# Patient Record
Sex: Female | Born: 1935 | Race: Black or African American | Hispanic: No | Marital: Married | Smoking: Former smoker
Health system: Southern US, Community
[De-identification: ages and names within clinical notes are randomized; demographics above are authoritative.]

## PROBLEM LIST (undated history)

## (undated) DIAGNOSIS — E7800 Pure hypercholesterolemia, unspecified: Secondary | ICD-10-CM

## (undated) DIAGNOSIS — K219 Gastro-esophageal reflux disease without esophagitis: Secondary | ICD-10-CM

## (undated) DIAGNOSIS — Z170 Estrogen receptor positive status [ER+]: Secondary | ICD-10-CM

## (undated) DIAGNOSIS — I499 Cardiac arrhythmia, unspecified: Secondary | ICD-10-CM

## (undated) DIAGNOSIS — C50212 Malignant neoplasm of upper-inner quadrant of left female breast: Secondary | ICD-10-CM

## (undated) DIAGNOSIS — C50919 Malignant neoplasm of unspecified site of unspecified female breast: Secondary | ICD-10-CM

## (undated) DIAGNOSIS — Z923 Personal history of irradiation: Secondary | ICD-10-CM

## (undated) DIAGNOSIS — I10 Essential (primary) hypertension: Secondary | ICD-10-CM

## (undated) DIAGNOSIS — R519 Headache, unspecified: Secondary | ICD-10-CM

## (undated) DIAGNOSIS — H269 Unspecified cataract: Secondary | ICD-10-CM

## (undated) DIAGNOSIS — R51 Headache: Secondary | ICD-10-CM

## (undated) DIAGNOSIS — C50912 Malignant neoplasm of unspecified site of left female breast: Secondary | ICD-10-CM

## (undated) HISTORY — DX: Malignant neoplasm of unspecified site of left female breast: C50.912

## (undated) HISTORY — DX: Unspecified cataract: H26.9

## (undated) HISTORY — PX: OOPHORECTOMY: SHX86

## (undated) HISTORY — DX: Estrogen receptor positive status (ER+): Z17.0

## (undated) HISTORY — DX: Malignant neoplasm of upper-inner quadrant of left female breast: C50.212

## (undated) HISTORY — PX: ABDOMINAL HYSTERECTOMY: SHX81

---

## 2004-08-23 ENCOUNTER — Ambulatory Visit: Payer: Self-pay | Admitting: Internal Medicine

## 2005-03-01 ENCOUNTER — Ambulatory Visit: Payer: Self-pay | Admitting: Neurology

## 2005-12-19 ENCOUNTER — Ambulatory Visit: Payer: Self-pay | Admitting: Internal Medicine

## 2006-01-06 ENCOUNTER — Ambulatory Visit: Payer: Self-pay | Admitting: Internal Medicine

## 2006-07-11 ENCOUNTER — Ambulatory Visit: Payer: Self-pay | Admitting: Internal Medicine

## 2007-01-08 ENCOUNTER — Ambulatory Visit: Payer: Self-pay | Admitting: Nurse Practitioner

## 2007-03-05 ENCOUNTER — Ambulatory Visit: Payer: Self-pay | Admitting: Nurse Practitioner

## 2008-04-17 ENCOUNTER — Ambulatory Visit: Payer: Self-pay | Admitting: Internal Medicine

## 2008-05-05 ENCOUNTER — Ambulatory Visit: Payer: Self-pay | Admitting: Internal Medicine

## 2009-02-07 ENCOUNTER — Ambulatory Visit: Payer: Self-pay | Admitting: Otolaryngology

## 2009-03-18 ENCOUNTER — Ambulatory Visit: Payer: Self-pay | Admitting: Internal Medicine

## 2009-05-11 ENCOUNTER — Ambulatory Visit: Payer: Self-pay | Admitting: Family Medicine

## 2010-05-14 ENCOUNTER — Ambulatory Visit: Payer: Self-pay | Admitting: Family Medicine

## 2011-06-18 ENCOUNTER — Ambulatory Visit: Payer: Self-pay | Admitting: Family Medicine

## 2012-06-24 ENCOUNTER — Ambulatory Visit: Payer: Self-pay | Admitting: Family Medicine

## 2013-02-03 ENCOUNTER — Ambulatory Visit: Payer: Self-pay | Admitting: Ophthalmology

## 2013-06-25 ENCOUNTER — Ambulatory Visit: Payer: Self-pay | Admitting: Family Medicine

## 2013-11-04 HISTORY — PX: EYE SURGERY: SHX253

## 2014-03-02 DIAGNOSIS — E7800 Pure hypercholesterolemia, unspecified: Secondary | ICD-10-CM | POA: Insufficient documentation

## 2014-03-02 DIAGNOSIS — I10 Essential (primary) hypertension: Secondary | ICD-10-CM | POA: Insufficient documentation

## 2014-03-02 DIAGNOSIS — K219 Gastro-esophageal reflux disease without esophagitis: Secondary | ICD-10-CM | POA: Insufficient documentation

## 2014-03-02 DIAGNOSIS — N189 Chronic kidney disease, unspecified: Secondary | ICD-10-CM | POA: Insufficient documentation

## 2014-03-02 DIAGNOSIS — I2510 Atherosclerotic heart disease of native coronary artery without angina pectoris: Secondary | ICD-10-CM | POA: Insufficient documentation

## 2014-03-02 DIAGNOSIS — E119 Type 2 diabetes mellitus without complications: Secondary | ICD-10-CM | POA: Insufficient documentation

## 2014-07-12 ENCOUNTER — Ambulatory Visit: Payer: Self-pay | Admitting: Family Medicine

## 2014-08-29 ENCOUNTER — Ambulatory Visit: Payer: Self-pay | Admitting: Physician Assistant

## 2014-09-06 ENCOUNTER — Ambulatory Visit: Payer: Self-pay | Admitting: Family Medicine

## 2015-07-31 ENCOUNTER — Other Ambulatory Visit: Payer: Self-pay | Admitting: Family Medicine

## 2015-07-31 DIAGNOSIS — Z1231 Encounter for screening mammogram for malignant neoplasm of breast: Secondary | ICD-10-CM

## 2015-08-03 ENCOUNTER — Ambulatory Visit
Admission: RE | Admit: 2015-08-03 | Discharge: 2015-08-03 | Disposition: A | Discharge status: Home / Self Care | Payer: Medicare PPO | Source: Ambulatory Visit | Attending: Family Medicine | Admitting: Family Medicine

## 2015-08-03 DIAGNOSIS — Z1231 Encounter for screening mammogram for malignant neoplasm of breast: Secondary | ICD-10-CM | POA: Diagnosis not present

## 2015-11-05 DIAGNOSIS — C50919 Malignant neoplasm of unspecified site of unspecified female breast: Secondary | ICD-10-CM

## 2015-11-05 HISTORY — DX: Malignant neoplasm of unspecified site of unspecified female breast: C50.919

## 2016-06-28 ENCOUNTER — Other Ambulatory Visit: Payer: Self-pay | Admitting: Family Medicine

## 2016-06-28 DIAGNOSIS — Z1231 Encounter for screening mammogram for malignant neoplasm of breast: Secondary | ICD-10-CM

## 2016-08-04 DIAGNOSIS — C50912 Malignant neoplasm of unspecified site of left female breast: Secondary | ICD-10-CM

## 2016-08-04 HISTORY — DX: Malignant neoplasm of unspecified site of left female breast: C50.912

## 2016-08-06 ENCOUNTER — Other Ambulatory Visit: Payer: Self-pay | Admitting: Family Medicine

## 2016-08-06 ENCOUNTER — Ambulatory Visit
Admission: RE | Admit: 2016-08-06 | Discharge: 2016-08-06 | Disposition: A | Discharge status: Home / Self Care | Payer: Medicare Other | Source: Ambulatory Visit | Attending: Family Medicine | Admitting: Family Medicine

## 2016-08-06 DIAGNOSIS — Z1231 Encounter for screening mammogram for malignant neoplasm of breast: Secondary | ICD-10-CM | POA: Diagnosis present

## 2016-08-06 DIAGNOSIS — R928 Other abnormal and inconclusive findings on diagnostic imaging of breast: Secondary | ICD-10-CM | POA: Diagnosis not present

## 2016-08-08 ENCOUNTER — Other Ambulatory Visit: Payer: Self-pay | Admitting: Family Medicine

## 2016-08-08 DIAGNOSIS — R928 Other abnormal and inconclusive findings on diagnostic imaging of breast: Secondary | ICD-10-CM

## 2016-08-08 DIAGNOSIS — N6320 Unspecified lump in the left breast, unspecified quadrant: Secondary | ICD-10-CM

## 2016-08-16 ENCOUNTER — Ambulatory Visit
Admission: RE | Admit: 2016-08-16 | Discharge: 2016-08-16 | Disposition: A | Discharge status: Home / Self Care | Payer: Medicare Other | Source: Ambulatory Visit | Attending: Family Medicine | Admitting: Family Medicine

## 2016-08-16 DIAGNOSIS — R928 Other abnormal and inconclusive findings on diagnostic imaging of breast: Secondary | ICD-10-CM

## 2016-08-16 DIAGNOSIS — N6320 Unspecified lump in the left breast, unspecified quadrant: Secondary | ICD-10-CM

## 2016-08-16 DIAGNOSIS — N6322 Unspecified lump in the left breast, upper inner quadrant: Secondary | ICD-10-CM | POA: Diagnosis not present

## 2016-08-22 ENCOUNTER — Other Ambulatory Visit: Payer: Self-pay | Admitting: Family Medicine

## 2016-08-22 DIAGNOSIS — N6320 Unspecified lump in the left breast, unspecified quadrant: Secondary | ICD-10-CM

## 2016-08-28 ENCOUNTER — Ambulatory Visit
Admission: RE | Admit: 2016-08-28 | Discharge: 2016-08-28 | Disposition: A | Discharge status: Home / Self Care | Payer: Medicare Other | Source: Ambulatory Visit | Attending: Family Medicine | Admitting: Family Medicine

## 2016-08-28 DIAGNOSIS — N6320 Unspecified lump in the left breast, unspecified quadrant: Secondary | ICD-10-CM | POA: Diagnosis present

## 2016-08-28 DIAGNOSIS — C50912 Malignant neoplasm of unspecified site of left female breast: Secondary | ICD-10-CM | POA: Insufficient documentation

## 2016-08-28 HISTORY — PX: BREAST BIOPSY: SHX20

## 2016-09-02 NOTE — Progress Notes (Signed)
  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 Scheduled patient to see Dr. Adonis Huguenin at [facility] 10 31/17 at 2:45, and Dr. Mike Gip 09/11/16 at 2:30. Appointments scheduled in [place] since patient lives in [place] per family request.  [employer] Breast Cancer Treatment Handbook/folder with hospital services.

## 2016-09-03 ENCOUNTER — Ambulatory Visit (INDEPENDENT_AMBULATORY_CARE_PROVIDER_SITE_OTHER): Payer: Medicare Other | Admitting: General Surgery

## 2016-09-03 ENCOUNTER — Encounter: Payer: Self-pay | Admitting: General Surgery

## 2016-09-03 ENCOUNTER — Other Ambulatory Visit: Payer: Self-pay

## 2016-09-03 VITALS — BP 147/73 | Temp 98.0°F | Ht 65.0 in | Wt 166.5 lb

## 2016-09-03 DIAGNOSIS — C50312 Malignant neoplasm of lower-inner quadrant of left female breast: Secondary | ICD-10-CM | POA: Insufficient documentation

## 2016-09-03 DIAGNOSIS — C50919 Malignant neoplasm of unspecified site of unspecified female breast: Secondary | ICD-10-CM

## 2016-09-03 DIAGNOSIS — Z170 Estrogen receptor positive status [ER+]: Secondary | ICD-10-CM

## 2016-09-03 NOTE — Progress Notes (Signed)
Patient ID: Erica Jackson, female   DOB: 07-14-1934, 82 y.o.   MRN: [id number]  CC: BREAST CANCER  HPI Erica Jackson is a 82 y.o. female presents to clinic today for evaluation of a new diagnosis of left breast cancer. Patient reports that she received her annual mammogram earlier this year which found a new finding her left breast that is our to been biopsied. She has been doing self exams and does not feel anything new or different. She is otherwise in her usual state of health. She reports never using birth control, never having hormone replacement therapy, no prior breast biopsies or any personal history of breast tumors. She does have a significant family history of cancers but none of them were breast. There were in her Brothers. She is otherwise in her usual state of excellent health and desires at this cancer versus possible.  HPI  History reviewed. No pertinent past medical history.  Past Surgical History:  Procedure Laterality Date  . ABDOMINAL HYSTERECTOMY    . BREAST BIOPSY Left 08/28/2016   path pending  . EYE SURGERY  2015    Family History  Problem Relation Age of Onset  . Stroke Mother   . Cancer Sister   . Cancer Brother   . Cancer Daughter   . Cancer Brother   . Cancer Brother   . Cancer Brother   . Cancer Brother   . Cancer Brother   . Breast cancer Neg Hx     Social History Social History  Substance Use Topics  . Smoking status: Former Smoker    Packs/day: 1.00  . Smokeless tobacco: Former Systems developer  . Alcohol use No    Not on File  Current Outpatient Prescriptions  Medication Sig Dispense Refill  . aspirin EC 81 MG tablet Take by mouth.    Marland Kitchen atenolol (TENORMIN) 25 MG tablet TAKE ONE TABLET BY MOUTH ONCE DAILY    . felodipine (PLENDIL) 5 MG 24 hr tablet Take by mouth.    . fluticasone (FLONASE) 50 MCG/ACT nasal spray Place into the nose.    . isosorbide dinitrate (ISORDIL) 20 MG tablet Take by mouth.    . lovastatin (MEVACOR) 10 MG tablet TAKE ONE  TABLET BY MOUTH ONCE DAILY WITH DINNER    . omeprazole (PRILOSEC) 20 MG capsule Take by mouth.    . potassium chloride SA (K-DUR,KLOR-CON) 20 MEQ tablet Take by mouth.     No current facility-administered medications for this visit.      Review of Systems A Multi-point review of systems was asked and was negative except for the findings documented in the history of present illness  Physical Exam Blood pressure (!) 147/73, temperature 98 F (36.7 C), temperature source Oral, height 5\' 5"  (1.651 m), weight 75.5 kg (166 lb 8 oz). CONSTITUTIONAL: No acute distress. EYES: Pupils are equal, round, and reactive to light, Sclera are non-icteric. EARS, NOSE, MOUTH AND THROAT: The oropharynx is clear. The oral mucosa is pink and moist. Hearing is intact to voice. LYMPH NODES:  Lymph nodes in the neck and bilateral axilla are normal. Breast: Bilateral breast examined. There are no palpable dominant masses to either breast. Previous biopsy location of the left breast is well healed without any evidence of infection or fluid collection. RESPIRATORY:  Lungs are clear. There is normal respiratory effort, with equal breath sounds bilaterally, and without pathologic use of accessory muscles. CARDIOVASCULAR: Heart is regular without murmurs, gallops, or rubs. GI: The abdomen is soft, nontender, and  nondistended. There are no palpable masses. There is no hepatosplenomegaly. There are normal bowel sounds in all quadrants. GU: Rectal deferred.   MUSCULOSKELETAL: Normal muscle strength and tone. No cyanosis or edema.   SKIN: Turgor is good and there are no pathologic skin lesions or ulcers. NEUROLOGIC: Motor and sensation is grossly normal. Cranial nerves are grossly intact. PSYCH:  Oriented to person, place and time. Affect is normal.  Data Reviewed Images and labs reviewed. Images of left breast show a 1.1 cm mass at about 10:00. This is biopsied which showed an ER positive, PR negative invasive mammary  breast cancer I have personally reviewed the patient's imaging, laboratory findings and medical records.    Assessment    Left-sided breast cancer.    Plan    82 year old female with a left-sided breast cancer that per imaging is a stage I tumor. There is no evidence of axillary lymphadenopathy on imaging or physical exam. Discussed the diagnosis at length with the patient and her daughters to include her treatment options. Treatment options discussed were lumpectomy with sentinel node, mastectomy with sentinel node, chemotherapy and radiation, no intervention at all. After discussing all these options and the pluses and minuses of them, the patient and her daughters have chosen to proceed with a needle localized lumpectomy with sentinel lymph node. The procedure was again described in detail to include the risks, benefits, alternatives. The primary risks being pain, bleeding, infection, residual tumor requiring additional surgical intervention. The patient and her daughters voiced understanding and all questions drain to their satisfaction. Plan to proceed to surgery on Tuesday, November 14.     Time spent with the patient was 45 minutes, with more than 50% of the time spent in face-to-face education, counseling and care coordination.     Clayburn Pert, MD FACS General Surgeon 09/03/2016, 3:54 PM

## 2016-09-03 NOTE — Patient Instructions (Addendum)
We have your surgery scheduled for 09/17/16 with Dr.Woodham at [facility] Please see the [facility] pre-care surgery sheet for surgery information.Please call our office if you have questions or concerns.

## 2016-09-04 HISTORY — PX: BREAST LUMPECTOMY: SHX2

## 2016-09-04 LAB — SURGICAL PATHOLOGY

## 2016-09-06 ENCOUNTER — Other Ambulatory Visit: Payer: Self-pay

## 2016-09-06 ENCOUNTER — Other Ambulatory Visit: Payer: Self-pay | Admitting: General Surgery

## 2016-09-06 DIAGNOSIS — C50912 Malignant neoplasm of unspecified site of left female breast: Secondary | ICD-10-CM

## 2016-09-06 DIAGNOSIS — Z170 Estrogen receptor positive status [ER+]: Principal | ICD-10-CM

## 2016-09-06 NOTE — Addendum Note (Signed)
Addended by: Clayburn Pert T on: 09/06/2016 11:15 PM   Modules accepted: Orders, SmartSet

## 2016-09-09 ENCOUNTER — Encounter
Admission: RE | Admit: 2016-09-09 | Discharge: 2016-09-09 | Disposition: A | Discharge status: Home / Self Care | Payer: Medicare Other | Source: Ambulatory Visit | Attending: General Surgery | Admitting: General Surgery

## 2016-09-09 ENCOUNTER — Telehealth: Payer: Self-pay | Admitting: General Surgery

## 2016-09-09 ENCOUNTER — Other Ambulatory Visit: Payer: Self-pay

## 2016-09-09 ENCOUNTER — Encounter: Payer: Self-pay | Admitting: Family Medicine

## 2016-09-09 ENCOUNTER — Ambulatory Visit
Admission: RE | Admit: 2016-09-09 | Discharge: 2016-09-09 | Disposition: A | Discharge status: Home / Self Care | Payer: Medicare Other | Source: Ambulatory Visit | Attending: General Surgery | Admitting: General Surgery

## 2016-09-09 DIAGNOSIS — C50912 Malignant neoplasm of unspecified site of left female breast: Secondary | ICD-10-CM | POA: Diagnosis not present

## 2016-09-09 DIAGNOSIS — Z01818 Encounter for other preprocedural examination: Secondary | ICD-10-CM | POA: Insufficient documentation

## 2016-09-09 DIAGNOSIS — I10 Essential (primary) hypertension: Secondary | ICD-10-CM | POA: Diagnosis not present

## 2016-09-09 DIAGNOSIS — I517 Cardiomegaly: Secondary | ICD-10-CM | POA: Insufficient documentation

## 2016-09-09 DIAGNOSIS — Z9889 Other specified postprocedural states: Secondary | ICD-10-CM | POA: Diagnosis not present

## 2016-09-09 DIAGNOSIS — K219 Gastro-esophageal reflux disease without esophagitis: Secondary | ICD-10-CM | POA: Diagnosis not present

## 2016-09-09 DIAGNOSIS — Z823 Family history of stroke: Secondary | ICD-10-CM | POA: Diagnosis not present

## 2016-09-09 DIAGNOSIS — Z170 Estrogen receptor positive status [ER+]: Secondary | ICD-10-CM | POA: Diagnosis not present

## 2016-09-09 DIAGNOSIS — Z7982 Long term (current) use of aspirin: Secondary | ICD-10-CM | POA: Diagnosis not present

## 2016-09-09 DIAGNOSIS — Z01812 Encounter for preprocedural laboratory examination: Secondary | ICD-10-CM | POA: Diagnosis not present

## 2016-09-09 DIAGNOSIS — Z79899 Other long term (current) drug therapy: Secondary | ICD-10-CM | POA: Diagnosis not present

## 2016-09-09 DIAGNOSIS — Z87891 Personal history of nicotine dependence: Secondary | ICD-10-CM | POA: Diagnosis not present

## 2016-09-09 DIAGNOSIS — Z808 Family history of malignant neoplasm of other organs or systems: Secondary | ICD-10-CM | POA: Diagnosis not present

## 2016-09-09 DIAGNOSIS — Z90710 Acquired absence of both cervix and uterus: Secondary | ICD-10-CM | POA: Diagnosis not present

## 2016-09-09 HISTORY — DX: Headache, unspecified: R51.9

## 2016-09-09 HISTORY — DX: Headache: R51

## 2016-09-09 HISTORY — DX: Essential (primary) hypertension: I10

## 2016-09-09 HISTORY — DX: Cardiac arrhythmia, unspecified: I49.9

## 2016-09-09 HISTORY — DX: Pure hypercholesterolemia, unspecified: E78.00

## 2016-09-09 HISTORY — DX: Gastro-esophageal reflux disease without esophagitis: K21.9

## 2016-09-09 LAB — CBC WITH DIFFERENTIAL/PLATELET
Basophils Absolute: 0 10*3/uL (ref 0–0.1)
Basophils Relative: 0 %
EOS ABS: 0 10*3/uL (ref 0–0.7)
EOS PCT: 0 %
HCT: 37.2 % (ref 35.0–47.0)
HEMOGLOBIN: 12.4 g/dL (ref 12.0–16.0)
LYMPHS ABS: 1.5 10*3/uL (ref 1.0–3.6)
Lymphocytes Relative: 23 %
MCH: 30.4 pg (ref 26.0–34.0)
MCHC: 33.3 g/dL (ref 32.0–36.0)
MCV: 91.4 fL (ref 80.0–100.0)
MONO ABS: 0.4 10*3/uL (ref 0.2–0.9)
MONOS PCT: 6 %
NEUTROS PCT: 71 %
Neutro Abs: 4.8 10*3/uL (ref 1.4–6.5)
Platelets: 181 10*3/uL (ref 150–440)
RBC: 4.07 MIL/uL (ref 3.80–5.20)
RDW: 14.6 % — ABNORMAL HIGH (ref 11.5–14.5)
WBC: 6.9 10*3/uL (ref 3.6–11.0)

## 2016-09-09 LAB — APTT: aPTT: 28 seconds (ref 24–36)

## 2016-09-09 LAB — BASIC METABOLIC PANEL
Anion gap: 7 (ref 5–15)
BUN: 16 mg/dL (ref 6–20)
CHLORIDE: 106 mmol/L (ref 101–111)
CO2: 27 mmol/L (ref 22–32)
CREATININE: 1.17 mg/dL — AB (ref 0.44–1.00)
Calcium: 9.4 mg/dL (ref 8.9–10.3)
GFR calc Af Amer: 50 mL/min — ABNORMAL LOW (ref >60)
GFR calc non Af Amer: 43 mL/min — ABNORMAL LOW (ref >60)
Glucose, Bld: 90 mg/dL (ref 65–99)
Potassium: 4.2 mmol/L (ref 3.5–5.1)
Sodium: 140 mmol/L (ref 135–145)

## 2016-09-09 LAB — PROTIME-INR
INR: 1.05
PROTHROMBIN TIME: 13.7 s (ref 11.4–15.2)

## 2016-09-09 NOTE — Patient Instructions (Signed)
  Your procedure is scheduled [id number] Nov. 14 , 2017. Report to Radiology in medical mall at 8:15 am.   Remember: Instructions that are not followed completely may result in serious medical risk, up to and including death, or upon the discretion of your surgeon and anesthesiologist your surgery may need to be rescheduled.    _x___ 1. Do not eat food or drink liquids after midnight. No gum chewing or hard candies.     ____ 2. No Alcohol for 24 hours before or after surgery.   ____ 3. Bring all medications with you on the day of surgery if instructed.    __x__ 4. Notify your doctor if there is any change in your medical condition     (cold, fever, infections).    _____ 5. No smoking 24 hours prior to surgery.     Do not wear jewelry, make-up, hairpins, clips or nail polish.  Do not wear lotions, powders, or perfumes.   Do not shave 48 hours prior to surgery. Men may shave face and neck.  Do not bring valuables to the hospital.    [facility] is not responsible for any belongings or valuables.               Contacts, dentures or bridgework may not be worn into surgery.  Leave your suitcase in the car. After surgery it may be brought to your room.  For patients admitted to the hospital, discharge time is determined by your treatment team.   Patients discharged the day of surgery will not be allowed to drive home.    Please read over the following fact sheets that you were given:   [facility] Preparing for Surgery  _x___ Take these medicines the morning of surgery with A SIP OF WATER:    1. atenolol (TENORMIN)  2. omeprazole (PRILOSEC)  3. felodipine (PLENDIL)  ____ Fleet Enema (as directed)   _x_ Use CHG Soap as directed on instruction sheet  ____ Use inhalers on the day of surgery and bring to hospital day of surgery  ____ Stop metformin 2 days prior to surgery    ____ Take 1/2 of usual insulin dose the night before surgery and none on the morning of surgery.   _x___  Stop aspirin on Thursday 09/12/16( last dose on Wednesday).  __x__ Stop Anti-inflammatories such as Advil, Aleve, Ibuprofen, Motrin, Naproxen, Naprosyn, Goodies powders or aspirin products. OK to take Tylenol.   ____ Stop supplements until after surgery.    ____ Bring C-Pap to the hospital.

## 2016-09-09 NOTE — Patient Instructions (Signed)
  Your procedure is scheduled [id number] Nov. 14 , 2017. Report to [facility] 8:18 am.   Remember: Instructions that are not followed completely may result in serious medical risk, up to and including death, or upon the discretion of your surgeon and anesthesiologist your surgery may need to be rescheduled.    _x___ 1. Do not eat food or drink liquids after midnight. No gum chewing or hard candies.     ____ 2. No Alcohol for 24 hours before or after surgery.   ____ 3. Bring all medications with you on the day of surgery if instructed.    __x__ 4. Notify your doctor if there is any change in your medical condition     (cold, fever, infections).    _____ 5. No smoking 24 hours prior to surgery.     Do not wear jewelry, make-up, hairpins, clips or nail polish.  Do not wear lotions, powders, or perfumes.   Do not shave 48 hours prior to surgery. Men may shave face and neck.  Do not bring valuables to the hospital.    [facility] is not responsible for any belongings or valuables.               Contacts, dentures or bridgework may not be worn into surgery.  Leave your suitcase in the car. After surgery it may be brought to your room.  For patients admitted to the hospital, discharge time is determined by your treatment team.   Patients discharged the day of surgery will not be allowed to drive home.    Please read over the following fact sheets that you were given:   [facility] Preparing for Surgery  ____ Take these medicines the morning of surgery with A SIP OF WATER:    1.   2.   3.   4.  5.  6.  ____ Fleet Enema (as directed)   ____ Use CHG Soap as directed on instruction sheet  ____ Use inhalers on the day of surgery and bring to hospital day of surgery  ____ Stop metformin 2 days prior to surgery    ____ Take 1/2 of usual insulin dose the night before surgery and none on the morning of          surgery.   ____ Stop Coumadin/Plavix/aspirin on   ____  Stop Anti-inflammatories such as Advil, Aleve, Ibuprofen, Motrin, Naproxen,  Naprosyn, Goodies powders or aspirin products. OK to take Tylenol.   ____ Stop supplements until after surgery.    ____ Bring C-Pap to the hospital.

## 2016-09-09 NOTE — Telephone Encounter (Signed)
Pt advised of pre op date/time and sx date. Sx: 09/17/16 with Dr Theadora Rama breast lumpectomy with Needle localization and sentinel node injection.  Pre op: 09/09/16 @ 1:15pm--office.  Patient made aware to arrive at the Medical Mall--Radiology-- at [facility] the day of surgery at 8:15am. From there they will do the Sentinel Node injection at Radiology, the patient will then be sent to [facility] for her Needle localization and then sent to same day surgery.

## 2016-09-11 ENCOUNTER — Encounter: Payer: Self-pay | Admitting: Hematology and Oncology

## 2016-09-11 ENCOUNTER — Inpatient Hospital Stay: Payer: Medicare Other | Attending: Hematology and Oncology | Admitting: Hematology and Oncology

## 2016-09-11 ENCOUNTER — Other Ambulatory Visit: Payer: Self-pay | Admitting: *Deleted

## 2016-09-11 VITALS — BP 118/69 | HR 60 | Temp 96.8°F | Resp 18 | Ht 64.0 in | Wt 165.6 lb

## 2016-09-11 DIAGNOSIS — Z7982 Long term (current) use of aspirin: Secondary | ICD-10-CM | POA: Insufficient documentation

## 2016-09-11 DIAGNOSIS — Z87891 Personal history of nicotine dependence: Secondary | ICD-10-CM | POA: Diagnosis not present

## 2016-09-11 DIAGNOSIS — Z803 Family history of malignant neoplasm of breast: Secondary | ICD-10-CM | POA: Diagnosis not present

## 2016-09-11 DIAGNOSIS — C50312 Malignant neoplasm of lower-inner quadrant of left female breast: Secondary | ICD-10-CM

## 2016-09-11 DIAGNOSIS — Z79899 Other long term (current) drug therapy: Secondary | ICD-10-CM

## 2016-09-11 DIAGNOSIS — Z170 Estrogen receptor positive status [ER+]: Secondary | ICD-10-CM | POA: Diagnosis not present

## 2016-09-11 DIAGNOSIS — E7800 Pure hypercholesterolemia, unspecified: Secondary | ICD-10-CM

## 2016-09-11 DIAGNOSIS — K219 Gastro-esophageal reflux disease without esophagitis: Secondary | ICD-10-CM | POA: Diagnosis not present

## 2016-09-11 DIAGNOSIS — Z809 Family history of malignant neoplasm, unspecified: Secondary | ICD-10-CM | POA: Diagnosis not present

## 2016-09-11 DIAGNOSIS — I499 Cardiac arrhythmia, unspecified: Secondary | ICD-10-CM

## 2016-09-11 DIAGNOSIS — I10 Essential (primary) hypertension: Secondary | ICD-10-CM | POA: Diagnosis not present

## 2016-09-11 DIAGNOSIS — C50212 Malignant neoplasm of upper-inner quadrant of left female breast: Secondary | ICD-10-CM | POA: Diagnosis not present

## 2016-09-11 LAB — HEPATIC FUNCTION PANEL
ALT: 22 U/L (ref 14–54)
AST: 23 U/L (ref 15–41)
Albumin: 4 g/dL (ref 3.5–5.0)
Alkaline Phosphatase: 93 U/L (ref 38–126)
Bilirubin, Direct: <0.1 mg/dL — ABNORMAL LOW (ref 0.1–0.5)
Total Bilirubin: 0.3 mg/dL (ref 0.3–1.2)
Total Protein: 8.3 g/dL — ABNORMAL HIGH (ref 6.5–8.1)

## 2016-09-11 NOTE — Progress Notes (Signed)
Patient here today as new evaluation regarding breast cancer.  Referred by Dr. Kandice Robinsons.

## 2016-09-11 NOTE — Progress Notes (Signed)
[facility] day:  09/11/2016  Chief Complaint: JOHNNAE IMPASTATO is a 82 y.o. female with left breast cancer who is referred by Dr. Sherrin Daisy for assessment and management.  HPI:  The patient has had yearly screening mammograms.  Prior to recent events, mammogram on 08/03/2015 revealed no evidence of malignancy.  She denied any breast changes.  Digital screening mammogram on 08/06/2016 revealed a possible mass with distortion in the left breast.  Unilateral left mammogram and ultrasound on 08/16/2016 revealed a 1.1 x 0.6 x 0.8 cm suspicious mass at the 10 o'clock position.  Left breast biopsy at the 10 o'clock position on 08/28/2016 revealed a 0.9 cm grade II invasive mammary carcinoma.  Tumor was ER positive (> 90%), PR negative, and Her2 2+ (FISH negative).    She is scheduled for lumpectomy by Dr. Clayburn Pert on 09/17/2016.  She denies any family history of breast cancer.  She has never been on birth control or been on hormone replacement therapy.  Symptomatically, she feels good.  She denies any GI or pulmonary symptoms.  She denies any bone pain.   Past Medical History:  Diagnosis Date  . Dysrhythmia   . GERD (gastroesophageal reflux disease)   . Headache   . Hypercholesterolemia   . Hypertension     Past Surgical History:  Procedure Laterality Date  . ABDOMINAL HYSTERECTOMY    . BREAST BIOPSY Left 08/28/2016   path pending  . EYE SURGERY  2015    Family History  Problem Relation Age of Onset  . Stroke Mother   . Cancer Sister   . Cancer Brother   . Cancer Daughter   . Cancer Brother   . Cancer Brother   . Cancer Brother   . Cancer Brother   . Cancer Brother   . Breast cancer Neg Hx    The patient notes longevity in her family.  Her aunt lived to 68.  Her great grandmother lived to 48.  She had 12 siblings (8 brothers and 4 sisters).  Social History:  reports that she quit smoking about 27 years ago. She smoked  1.00 pack per day. She quit smokeless tobacco use about 27 years ago. She reports that she does not drink alcohol or use drugs.  The patient previously smoked 1 pack/week for 5-10 years.  She stopped smoking in 1990.  The patient is accompanied by her daughters, [place] and [place], and  today.  Allergies: Not on File  Current Medications: Current Outpatient Prescriptions  Medication Sig Dispense Refill  . aspirin EC 81 MG tablet Take by mouth.    Marland Kitchen atenolol (TENORMIN) 25 MG tablet TAKE ONE TABLET BY MOUTH ONCE DAILY    . felodipine (PLENDIL) 5 MG 24 hr tablet Take by mouth.    . fluticasone (FLONASE) 50 MCG/ACT nasal spray Place into the nose as needed.     . isosorbide dinitrate (ISORDIL) 20 MG tablet Take by mouth.    . lovastatin (MEVACOR) 10 MG tablet TAKE ONE TABLET BY MOUTH ONCE DAILY WITH DINNER    . omeprazole (PRILOSEC) 20 MG capsule Take by mouth.    . potassium chloride SA (K-DUR,KLOR-CON) 20 MEQ tablet Take by mouth.     No current facility-administered medications for this visit.     Review of Systems:  GENERAL:  Feels good.  No fevers, sweats or weight loss. PERFORMANCE STATUS (ECOG):  1 HEENT:  No visual changes, runny nose, sore throat, mouth sores  or tenderness. Lungs: No shortness of breath or cough.  No hemoptysis. Cardiac:  No chest pain, palpitations, orthopnea, or PND. GI:  Constipation.  No nausea, vomiting, diarrhea, melena or hematochezia.  Colonoscopy < 10 years ago.   GU:  No urgency, frequency, dysuria, or hematuria. Musculoskeletal:  No back pain.  No joint pain.  No muscle tenderness. Extremities:  No pain or swelling. Skin:  No rashes or skin changes. Neuro:  Headache once in awhile.  No numbness or weakness, balance or coordination issues. Endocrine:  No diabetes, thyroid issues, hot flashes or night sweats. Psych:  No mood changes, depression or anxiety. Pain:  No focal pain. Review of systems:  All other systems reviewed and found to be  negative.  Physical Exam: Blood pressure 118/69, pulse 60, temperature 96.8 F (36.0 C), temperature source Tympanic, resp. rate 18, weight 165 lb 9.1 oz (75.1 kg). GENERAL:  Well developed, well nourished, elderly woman sitting comfortably in the exam room in no acute distress. MENTAL STATUS:  Alert and oriented to person, place and time. HEAD:  Pearline Cables hair.  Normocephalic, atraumatic, face symmetric, no Cushingoid features. EYES:  Brown eyes.  Amblyopia.  Pupils equal round and reactive to light and accomodation.  No conjunctivitis or scleral icterus. ENT:  Oropharynx clear without lesion.  Tongue normal.  Upper and lower dentures.  Mucous membranes moist.  RESPIRATORY:  Clear to auscultation without rales, wheezes or rhonchi. CARDIOVASCULAR:  Regular rate and rhythm without murmur, rub or gallop. BREAST:  Fibrocystic changes bilaterally.  Right breast without masses, skin changes or nipple discharge.  Left breast without masses, skin changes or nipple discharge.  ABDOMEN:  Soft, non-tender, with active bowel sounds, and no hepatosplenomegaly.  No masses. SKIN:  No rashes, ulcers or lesions. EXTREMITIES: No edema, no skin discoloration or tenderness.  No palpable cords. LYMPH NODES: No palpable cervical, supraclavicular, axillary or inguinal adenopathy  NEUROLOGICAL: Unremarkable. PSYCH:  Appropriate.   Hospital Outpatient Visit on 09/09/2016  Component Date Value Ref Range Status  . Prothrombin Time 09/09/2016 13.7  11.4 - 15.2 seconds Final  . INR 09/09/2016 1.05   Final    Assessment:  JENEEN DOUTT is a 82 y.o. female with clinical stage I left breast cancer.  Mammogram and ultrasound on 08/16/2016 revealed a 1.1 x 0.6 x 0.8 cm mass at the 10 o'clock position.  Left breast biopsy on 08/28/2016 revealed a 0.9 cm grade II invasive mammary carcinoma.  Tumor was ER positive (> 90%), PR negative, and Her2 2+ (negative by FISH).  Symptomatically, she denies any complaints.  Exam is  unremarkable.  Plan: 1.  Discuss diagnosis and management of breast cancer.  Discuss plan for lumpectomy and sentinel lymph node biopsy.  Discuss plan for radiation following surgery.  Discuss hormonal therapy (tamoxifen versus aromatase inhibitors).   Side effects reviewed in detail.  Discuss baseline bone density study. 2.  Labs today:  LFTs, CA27.29. 3.  Bone density study 4.  RTC on 10/09/2016 for MD assessment, labs (CBC with diff, CMP), and review of pathology.   Lequita Asal, MD  09/11/2016

## 2016-09-12 LAB — CANCER ANTIGEN 27.29: CA 27.29: 21.7 U/mL (ref 0.0–38.6)

## 2016-09-17 ENCOUNTER — Ambulatory Visit
Admission: RE | Admit: 2016-09-17 | Discharge: 2016-09-17 | Disposition: A | Discharge status: Home / Self Care | Payer: Medicare Other | Source: Ambulatory Visit | Attending: General Surgery | Admitting: General Surgery

## 2016-09-17 ENCOUNTER — Ambulatory Visit: Payer: Medicare Other | Admitting: Anesthesiology

## 2016-09-17 ENCOUNTER — Encounter
Admission: RE | Disposition: A | Discharge status: Home / Self Care | Payer: Self-pay | Source: Ambulatory Visit | Attending: General Surgery

## 2016-09-17 ENCOUNTER — Encounter: Payer: Self-pay | Admitting: *Deleted

## 2016-09-17 DIAGNOSIS — Z90710 Acquired absence of both cervix and uterus: Secondary | ICD-10-CM | POA: Insufficient documentation

## 2016-09-17 DIAGNOSIS — Z7982 Long term (current) use of aspirin: Secondary | ICD-10-CM | POA: Insufficient documentation

## 2016-09-17 DIAGNOSIS — I10 Essential (primary) hypertension: Secondary | ICD-10-CM | POA: Insufficient documentation

## 2016-09-17 DIAGNOSIS — Z170 Estrogen receptor positive status [ER+]: Principal | ICD-10-CM

## 2016-09-17 DIAGNOSIS — K219 Gastro-esophageal reflux disease without esophagitis: Secondary | ICD-10-CM | POA: Insufficient documentation

## 2016-09-17 DIAGNOSIS — Z87891 Personal history of nicotine dependence: Secondary | ICD-10-CM | POA: Insufficient documentation

## 2016-09-17 DIAGNOSIS — C50912 Malignant neoplasm of unspecified site of left female breast: Secondary | ICD-10-CM | POA: Insufficient documentation

## 2016-09-17 DIAGNOSIS — C50212 Malignant neoplasm of upper-inner quadrant of left female breast: Secondary | ICD-10-CM | POA: Diagnosis not present

## 2016-09-17 DIAGNOSIS — Z79899 Other long term (current) drug therapy: Secondary | ICD-10-CM | POA: Insufficient documentation

## 2016-09-17 DIAGNOSIS — C50312 Malignant neoplasm of lower-inner quadrant of left female breast: Secondary | ICD-10-CM

## 2016-09-17 DIAGNOSIS — Z01810 Encounter for preprocedural cardiovascular examination: Secondary | ICD-10-CM

## 2016-09-17 DIAGNOSIS — Z9889 Other specified postprocedural states: Secondary | ICD-10-CM | POA: Insufficient documentation

## 2016-09-17 DIAGNOSIS — Z808 Family history of malignant neoplasm of other organs or systems: Secondary | ICD-10-CM | POA: Insufficient documentation

## 2016-09-17 DIAGNOSIS — Z823 Family history of stroke: Secondary | ICD-10-CM | POA: Insufficient documentation

## 2016-09-17 HISTORY — PX: BREAST LUMPECTOMY WITH SENTINEL LYMPH NODE BIOPSY: SHX5597

## 2016-09-17 HISTORY — PX: BREAST EXCISIONAL BIOPSY: SUR124

## 2016-09-17 SURGERY — BREAST LUMPECTOMY WITH SENTINEL LYMPH NODE BX
Anesthesia: General | Laterality: Left | Wound class: Clean

## 2016-09-17 MED ORDER — METHYLENE BLUE 0.5 % INJ SOLN
INTRAVENOUS | Status: DC | PRN
  Administered 2016-09-17: 7 mL via SUBMUCOSAL

## 2016-09-17 MED ORDER — FENTANYL CITRATE (PF) 100 MCG/2ML IJ SOLN
INTRAMUSCULAR | Status: DC | PRN
  Administered 2016-09-17 (×4): 25 ug via INTRAVENOUS

## 2016-09-17 MED ORDER — LACTATED RINGERS IV SOLN: INTRAVENOUS | Status: DC

## 2016-09-17 MED ORDER — FENTANYL CITRATE (PF) 100 MCG/2ML IJ SOLN: 25.0000 ug | INTRAMUSCULAR | Status: DC | PRN

## 2016-09-17 MED ORDER — CEFAZOLIN SODIUM-DEXTROSE 2-4 GM/100ML-% IV SOLN
INTRAVENOUS | Status: AC
  Administered 2016-09-17: 2 g via INTRAVENOUS
  Filled 2016-09-17: qty 100

## 2016-09-17 MED ORDER — CEFAZOLIN SODIUM-DEXTROSE 2-4 GM/100ML-% IV SOLN
2.0000 g | INTRAVENOUS | Status: AC
  Administered 2016-09-17: 2 g via INTRAVENOUS

## 2016-09-17 MED ORDER — HYDROCODONE-ACETAMINOPHEN 5-325 MG PO TABS
1.0000 | ORAL_TABLET | Freq: Four times a day (QID) | ORAL | Status: DC | PRN
Start: 2016-09-17 — End: 2016-09-17
  Administered 2016-09-17: 1 via ORAL

## 2016-09-17 MED ORDER — GLYCOPYRROLATE 0.2 MG/ML IJ SOLN
INTRAMUSCULAR | Status: DC | PRN
  Administered 2016-09-17: 0.2 mg via INTRAVENOUS

## 2016-09-17 MED ORDER — BUPIVACAINE HCL (PF) 0.5 % IJ SOLN
INTRAMUSCULAR | Status: AC
  Filled 2016-09-17: qty 30

## 2016-09-17 MED ORDER — CHLORHEXIDINE GLUCONATE CLOTH 2 % EX PADS: 6.0000 | MEDICATED_PAD | Freq: Once | CUTANEOUS | Status: DC

## 2016-09-17 MED ORDER — DOCUSATE SODIUM 100 MG PO CAPS: 100.0000 mg | ORAL_CAPSULE | Freq: Two times a day (BID) | ORAL | 0 refills | Status: DC

## 2016-09-17 MED ORDER — LACTATED RINGERS IV SOLN
INTRAVENOUS | Status: DC | PRN
  Administered 2016-09-17: 11:00:00 via INTRAVENOUS

## 2016-09-17 MED ORDER — LIDOCAINE-EPINEPHRINE (PF) 1 %-1:200000 IJ SOLN
INTRAMUSCULAR | Status: AC
  Filled 2016-09-17: qty 30

## 2016-09-17 MED ORDER — PROPOFOL 10 MG/ML IV BOLUS
INTRAVENOUS | Status: DC | PRN
  Administered 2016-09-17: 120 mg via INTRAVENOUS

## 2016-09-17 MED ORDER — TECHNETIUM TC 99M SULFUR COLLOID
1.0000 | Freq: Once | INTRAVENOUS | Status: AC | PRN
  Administered 2016-09-17: 1 via INTRAVENOUS

## 2016-09-17 MED ORDER — BUPIVACAINE HCL 0.5 % IJ SOLN
INTRAMUSCULAR | Status: DC | PRN
  Administered 2016-09-17: 5 mL

## 2016-09-17 MED ORDER — LIDOCAINE HCL (CARDIAC) 20 MG/ML IV SOLN
INTRAVENOUS | Status: DC | PRN
  Administered 2016-09-17: 60 mg via INTRAVENOUS

## 2016-09-17 MED ORDER — METHYLENE BLUE 0.5 % INJ SOLN
INTRAVENOUS | Status: AC
  Filled 2016-09-17: qty 10

## 2016-09-17 MED ORDER — HYDROCODONE-ACETAMINOPHEN 5-325 MG PO TABS: 1.0000 | ORAL_TABLET | Freq: Four times a day (QID) | ORAL | 0 refills | Status: DC | PRN

## 2016-09-17 MED ORDER — ONDANSETRON HCL 4 MG/2ML IJ SOLN: 4.0000 mg | Freq: Once | INTRAMUSCULAR | Status: DC | PRN

## 2016-09-17 MED ORDER — ONDANSETRON 4 MG PO TBDP: 4.0000 mg | ORAL_TABLET | Freq: Three times a day (TID) | ORAL | 0 refills | Status: DC | PRN

## 2016-09-17 MED ORDER — ONDANSETRON HCL 4 MG/2ML IJ SOLN
INTRAMUSCULAR | Status: DC | PRN
  Administered 2016-09-17: 4 mg via INTRAVENOUS

## 2016-09-17 MED ORDER — HYDROCODONE-ACETAMINOPHEN 5-325 MG PO TABS
ORAL_TABLET | ORAL | Status: AC
  Administered 2016-09-17: 1 via ORAL
  Filled 2016-09-17: qty 1

## 2016-09-17 MED ORDER — LIDOCAINE-EPINEPHRINE (PF) 1 %-1:200000 IJ SOLN
INTRAMUSCULAR | Status: DC | PRN
  Administered 2016-09-17: 5 mL

## 2016-09-17 SURGICAL SUPPLY — 34 items
BLADE SURG 15 STRL LF DISP TIS (BLADE) ×1 IMPLANT
BLADE SURG 15 STRL SS (BLADE) ×2
CANISTER SUCT 1200ML W/VALVE (MISCELLANEOUS) ×3 IMPLANT
CHLORAPREP W/TINT 26ML (MISCELLANEOUS) ×3 IMPLANT
CNTNR SPEC 2.5X3XGRAD LEK (MISCELLANEOUS) ×1
CONT SPEC 4OZ STER OR WHT (MISCELLANEOUS) ×2
CONTAINER SPEC 2.5X3XGRAD LEK (MISCELLANEOUS) ×1 IMPLANT
COVER PROBE FLX POLY STRL (MISCELLANEOUS) ×3 IMPLANT
DRAPE LAPAROTOMY TRNSV 106X77 (MISCELLANEOUS) ×3 IMPLANT
ELECT CAUTERY BLADE 6.4 (BLADE) ×3 IMPLANT
ELECT REM PT RETURN 9FT ADLT (ELECTROSURGICAL) ×3
ELECTRODE REM PT RTRN 9FT ADLT (ELECTROSURGICAL) ×1 IMPLANT
GLOVE BIO SURGEON STRL SZ7.5 (GLOVE) ×15 IMPLANT
GLOVE INDICATOR 8.0 STRL GRN (GLOVE) ×3 IMPLANT
GOWN STRL REUS W/ TWL LRG LVL3 (GOWN DISPOSABLE) ×3 IMPLANT
GOWN STRL REUS W/TWL LRG LVL3 (GOWN DISPOSABLE) ×6
KIT RM TURNOVER STRD PROC AR (KITS) ×3 IMPLANT
LABEL OR SOLS (LABEL) ×3 IMPLANT
LIQUID BAND (GAUZE/BANDAGES/DRESSINGS) ×3 IMPLANT
MARGIN MAP 10MM (MISCELLANEOUS) ×3 IMPLANT
NDL SAFETY ECLIPSE 18X1.5 (NEEDLE) ×1 IMPLANT
NEEDLE HYPO 18GX1.5 SHARP (NEEDLE) ×2
NEEDLE HYPO 25X1 1.5 SAFETY (NEEDLE) ×6 IMPLANT
PACK BASIN MINOR ARMC (MISCELLANEOUS) ×3 IMPLANT
SLEVE PROBE SENORX GAMMA FIND (MISCELLANEOUS) IMPLANT
SUT MNCRL 4-0 (SUTURE) ×2
SUT MNCRL 4-0 27XMFL (SUTURE) ×1
SUT SILK 2 0 SH (SUTURE) ×3 IMPLANT
SUT VIC AB 3-0 SH 27 (SUTURE) ×2
SUT VIC AB 3-0 SH 27X BRD (SUTURE) ×1 IMPLANT
SUTURE MNCRL 4-0 27XMF (SUTURE) ×1 IMPLANT
SYR BULB EAR ULCER 3OZ GRN STR (SYRINGE) ×3 IMPLANT
SYRINGE 10CC LL (SYRINGE) ×6 IMPLANT
WATER STERILE IRR 1000ML POUR (IV SOLUTION) ×3 IMPLANT

## 2016-09-17 NOTE — Discharge Instructions (Signed)
AMBULATORY SURGERY  DISCHARGE INSTRUCTIONS   1) The drugs that you were given will stay in your system until tomorrow so for the next 24 hours you should not:  A) Drive an automobile B) Make any legal decisions C) Drink any alcoholic beverage   2) You may resume regular meals tomorrow.  Today it is better to start with liquids and gradually work up to solid foods.  You may eat anything you prefer, but it is better to start with liquids, then soup and crackers, and gradually work up to solid foods.   3) Please notify your doctor immediately if you have any unusual bleeding, trouble breathing, redness and pain at the surgery site, drainage, fever, or pain not relieved by medication.    4) Additional Instructions:        Please contact your physician with any problems or Same Day Surgery at [phone], Monday through Friday 6 am to 4 pm, or Start at [facility] number at [phone].   PATIENT INSTRUCTIONS LUMPECTOMY/BREAST BIOPSY  FOLLOW-UP:  Please make an appointment with your physician in 1 week(s).  Call your physician immediately if you have any fevers greater than 102.5, drainage from you wound that is not clear or looks infected, persistent bleeding, or increasing breast pain not relieved with pain medication.    WOUND CARE INSTRUCTIONS:  Keep a dry clean dressing on the wound if there is drainage.  If clothing rubs against the wound or causes irritation and the wound is not draining you may cover it with a dry dressing during the daytime.  Try to keep the wound dry and avoid ointments on the wound unless directed to do so.  If the wound becomes bright red and painful or starts to drain infected material that is not clear or slightly blood-tinged, please contact your physician immediately.  If the wound is mildly pink and has a thick firm ridge underneath it, this is normal, and is referred to as a healing ridge.  This will resolve over the next 4-6 weeks.  You may  want to wear a sports bra post-operatively to give some additional support as well.  DIET:  You may eat any foods that you can tolerate.  It is a good idea to eat a high fiber diet and take in plenty of fluids to prevent constipation which can commonly be caused by prescription pain medication.  If you do become constipated you may want to take a mild laxative or take ducolax tablets on a daily basis until your bowel habits are regular.    MEDICATIONS:  Try to take narcotic medications and anti-inflammatory medications, such as tylenol, ibuprofen, naprosyn, etc., with food.  This will minimize stomach upset from the medication.  Should you develop nausea and vomiting from the pain medication, or develop a rash, please discontinue the medication and contact your physician.  You should not drive, make important decisions, or operate machinery when taking narcotic pain medication.  QUESTIONS:  Please feel free to call your physician or the hospital operator if you have any questions, and they will be glad to assist you.   Your pathology results will likely not be available for at least 3-4 business days, depending on the pathology lab. The results will be discussed with you at your follow-up appointment.   Sentinel Lymph Node Biopsy in Breast Cancer Treatment, Care After This sheet gives you information about how to care for yourself after your procedure. Your health care provider may also give you  more specific instructions. If you have problems or questions, contact your health care provider. What can I expect after the procedure? After the procedure, it is common to have:  Blue-colored urine for the next 24 hours. This is normal. It is caused by the dye that was used during the procedure.  Blue skin at the injection site for up to 8 weeks.  Numbness, tingling, or pain near your incision.  Swelling or bruising near your incision. Follow these instructions at home: Activity  Avoid activities  that take a lot of effort (are strenuous).  Return to your normal activities as told by your health care provider. Ask your health care provider what activities are safe for you. Incision care  Follow instructions from your health care provider about how to take care of your incision. Make sure you:  Wash your hands with soap and water before you change your bandage (dressing). If soap and water are not available, use hand sanitizer.  Change your dressing as told by your health care provider.  Leave stitches (sutures), skin glue, or adhesive strips in place. These skin closures may need to stay in place for 2 weeks or longer. If adhesive strip edges start to loosen and curl up, you may trim the loose edges. Do not remove adhesive strips completely unless your health care provider tells you to do that.  Do not take baths, swim, or use a hot tub until your health care provider approves. Ask your health care provider if you can take showers. You may be able to shower 24 hours after your procedure.  Check your biopsy site every day for signs of infection. Check for:  More redness, swelling, or pain.  More fluid or blood.  Warmth.  Pus or a bad smell. General instructions  If you were given a surgical bra, wear it for the next 48 hours. You may remove the bra to shower.  Take over-the-counter and prescription medicines only as told by your health care provider.  You may resume your regular diet.  Do not have your blood pressure taken or have blood drawn from the arm on the side of the biopsy until your health care provider says it is okay.  You may need to be screened for extra fluid around the lymph nodes (lymphedema). Follow instructions from your health care provider about how often you should be checked.  Keep all follow-up visits as told by your health care provider. This is important. Contact a health care provider if:  You have more redness, swelling, or pain around your  biopsy site.  You have more fluid or blood coming from your incision.  Your incision feels warm to the touch.  You have pus or a bad smell coming from your incision.  You have nausea and vomiting.  You have any new bruising.  You have chills or fever. Get help right away if:  You have pain that is getting worse, and your medicine is not helping.  You have vomiting that will not stop.  You have chest pain or trouble breathing. Summary  Follow instructions from your health care provider about how to take care of your incision.  Do not have your blood pressure taken or have blood drawn from the arm on the side of the biopsy until your health care provider says it is okay.  You may need to be screened for extra fluid around the lymph nodes (lymphedema). Follow instructions from your health care provider about how often you should  be checked. This information is not intended to replace advice given to you by your health care provider. Make sure you discuss any questions you have with your health care provider. Document Released: 10/26/2013 Document Revised: 07/10/2016 Document Reviewed: 07/10/2016 Elsevier Interactive Patient Education  2017 [employer].

## 2016-09-17 NOTE — Brief Op Note (Signed)
09/17/2016  1:22 PM  PATIENT:  Erica Jackson  82 y.o. female  PRE-OPERATIVE DIAGNOSIS:  malignant neoplasm left breast  POST-OPERATIVE DIAGNOSIS:  malignant neoplasm left breast  PROCEDURE:  Procedure(s): BREAST LUMPECTOMY WITH SENTINEL LYMPH NODE BX (Left)  SURGEON:  Surgeon(s) and Role:    * Clayburn Pert, MD - Primary  PHYSICIAN ASSISTANT:   ASSISTANTS: PA STUDENT   ANESTHESIA:   general  EBL:  Total I/O In: 400 [I.V.:400] Out: -   BLOOD ADMINISTERED:none  DRAINS: none   LOCAL MEDICATIONS USED:  MARCAINE   , XYLOCAINE  and Amount: 10 ml  SPECIMEN:  Source of Specimen:  Left sentinel node and left breast mass  DISPOSITION OF SPECIMEN:  PATHOLOGY  COUNTS:  YES  TOURNIQUET:  * No tourniquets in log *  DICTATION: .Dragon Dictation  PLAN OF CARE: Discharge to home after PACU  PATIENT DISPOSITION:  PACU - hemodynamically stable.   Delay start of Pharmacological VTE agent (>24hrs) due to surgical blood loss or risk of bleeding: no

## 2016-09-17 NOTE — Transfer of Care (Signed)
Immediate Anesthesia Transfer of Care Note  Patient: Erica Jackson  Procedure(s) Performed: Procedure(s): BREAST LUMPECTOMY WITH SENTINEL LYMPH NODE BX (Left)  Patient Location: PACU  Anesthesia Type:General  Level of Consciousness: sedated  Airway & Oxygen Therapy: Patient Spontanous Breathing and Patient connected to face mask oxygen  Post-op Assessment: Report given to RN and Post -op Vital signs reviewed and stable  Post vital signs: Reviewed and stable  Last Vitals:  Vitals:   09/17/16 1040  BP: 135/77  Pulse: (!) 55  Resp: 16  Temp: 36.6 C    Last Pain:  Vitals:   09/17/16 1040  TempSrc: Oral  PainSc: 0-No pain      Patients Stated Pain Goal: 0 ([id number])  Complications: No apparent anesthesia complications

## 2016-09-17 NOTE — Anesthesia Procedure Notes (Signed)
Procedure Name: LMA Insertion Performed by: Faiza Bansal Pre-anesthesia Checklist: Patient identified, Patient being monitored, Timeout performed, Emergency Drugs available and Suction available Patient Re-evaluated:Patient Re-evaluated prior to inductionOxygen Delivery Method: Circle system utilized Preoxygenation: Pre-oxygenation with 100% oxygen Intubation Type: IV induction Ventilation: Mask ventilation without difficulty LMA: LMA inserted LMA Size: 4.0 Tube type: Oral Number of attempts: 1 Placement Confirmation: positive ETCO2 and breath sounds checked- equal and bilateral Tube secured with: Tape Dental Injury: Teeth and Oropharynx as per pre-operative assessment        

## 2016-09-17 NOTE — Progress Notes (Signed)
  Oncology Nurse Navigator Documentation  Navigator Location: [facility] (09/17/16 1500)   )Navigator Encounter Type: Treatment (09/17/16 1500)       Surgery Date: 09/17/16 (09/17/16 1500)             Patient Visit Type: Surgery (09/17/16 1500)                              Time Spent with Patient: 30 (09/17/16 1500)  Met with patient's family prior to , and post surgery.  State per MD, patient doing well post-op, and will be admitted to hospital.  Will follow-up tomorrow.

## 2016-09-17 NOTE — Op Note (Signed)
Pre-operative Diagnosis: Left breast cancer  Post-operative Diagnosis: Same  Procedure performed: Left sided needle localized breast mass excision, left axillary sentinel lymph node biopsy  Surgeon: Clayburn Pert   Assistants: PA student  Anesthesia: General LMA anesthesia  ASA Class: 2  Surgeon: Clayburn Pert, MD FACS  Anesthesia: Gen. with endotracheal tube  Assistant: PA student  Procedure Details  The patient was seen again in the Holding Room. The benefits, complications, treatment options, and expected outcomes were discussed with the patient. The risks of bleeding, infection, recurrence of tumor, failure to remove the entire tumor, any of which could require further surgery were reviewed with the patient.  The patient started the day in radiology where radiotracer and a left-sided wire localization were performed. The patient was taken to Operating Room, identified as ASET KOLLMEYER and the procedure verified.  A Time Out was held and the above information confirmed.  Prior to the induction of general anesthesia, antibiotic prophylaxis was administered. VTE prophylaxis was in place. General LMA anesthesia was then administered and tolerated well. After the induction, and prior to prepping the left breast was instilled with methylene blue and massaged for 5 minutes. The left breast and axilla was prepped with Chloraprep and draped in the sterile fashion. The patient was positioned in the supine position.  The procedure began with the sentinel lymph node biopsy. The area of maximum radiotracer was marked and then localized with a 50-50 mix 1% lidocaine 0.5% Marcaine. The skin was incised with 15 blade scalpel and using both electrocautery and blunt dissection was taken down until the maximal area of radiation was able to be localized. This was then grasped with a silk suture in a figure-of-eight fashion. He was excised in toto with electrocautery. Although it appeared to be a  lymph node it was not blue and was not radioactive after it was removed. A blue hot lymph node was then identified. It was also grasped with a silk suture in a figure-of-eight fashion and excised in toto. It had a maximum radiation count of 151. Aftercare was removed there was no radioactive uptake greater than 10 and no further blue lymph nodes. The axilla was irrigated and packed.  We then turned our attention to the left breast site. An incision was marked just above the areolar complex and localized before mentioned local anesthetic. Using Bovie much cautery a flap was created to the wire that was previously placed that was pulled into our operative field. Then using the wires a marker it was circumferentially taken down approximately 2 cm. Per the provided images for this was well below the previously marked tumor. This was then circumferentially excised and passed off the field as a specimen. He was taken to radiology where mammogram was performed at the specimen which confirmed the needle and the clip within the specimen. Entire area was completely irrigated and meticulous hemostasis was assured from both sites. Both sites were then closed with an interrupted deep 3-0 Vicryl suture. The skin was closed with a running subcuticular 4-0 Monocryl suture. He was then sealed Dermabond.  Patient tolerated procedure well. She was woken from mask anesthesia without difficulty. All counts were correct at the end of the procedure and there were no immediate, complications.  Findings: Left axillar sentinel node, left breast mass.   Estimated Blood Loss: 5 mL         Drains: None         Specimens: Left axillary sentinel node, left breast mass  Complications: None                  Condition: Good   Clayburn Pert, MD, FACS

## 2016-09-17 NOTE — Anesthesia Preprocedure Evaluation (Signed)
Anesthesia Evaluation  Patient identified by MRN, date of birth, ID band Patient awake    Reviewed: Allergy & Precautions, H&P , NPO status , Patient's Chart, lab work & pertinent test results, reviewed documented beta blocker date and time   Airway Mallampati: II  TM Distance: >3 FB Neck ROM: full    Dental  (+) Teeth Intact   Pulmonary neg pulmonary ROS, former smoker,    Pulmonary exam normal        Cardiovascular Exercise Tolerance: Good hypertension, + CAD  negative cardio ROS Normal cardiovascular exam+ dysrhythmias  Rate:Normal     Neuro/Psych  Headaches, negative neurological ROS  negative psych ROS   GI/Hepatic negative GI ROS, Neg liver ROS, GERD  Medicated,  Endo/Other  negative endocrine ROSdiabetes  Renal/GU Renal diseasenegative Renal ROS  negative genitourinary   Musculoskeletal   Abdominal   Peds  Hematology negative hematology ROS (+)   Anesthesia Other Findings   Reproductive/Obstetrics negative OB ROS                             Anesthesia Physical Anesthesia Plan  ASA: III  Anesthesia Plan: General LMA   Post-op Pain Management:    Induction:   Airway Management Planned:   Additional Equipment:   Intra-op Plan:   Post-operative Plan:   Informed Consent: I have reviewed the patients History and Physical, chart, labs and discussed the procedure including the risks, benefits and alternatives for the proposed anesthesia with the patient or authorized representative who has indicated his/her understanding and acceptance.     Plan Discussed with: CRNA  Anesthesia Plan Comments:         Anesthesia Quick Evaluation

## 2016-09-17 NOTE — Anesthesia Postprocedure Evaluation (Deleted)
Anesthesia Post Note  Patient: Erica Jackson  Procedure(s) Performed: Procedure(s) (LRB): BREAST LUMPECTOMY WITH SENTINEL LYMPH NODE BX (Left)  Patient location during evaluation: PACU Anesthesia Type: General Level of consciousness: awake and alert Pain management: pain level controlled Vital Signs Assessment: post-procedure vital signs reviewed and stable Respiratory status: spontaneous breathing, nonlabored ventilation, respiratory function stable and patient connected to nasal cannula oxygen Cardiovascular status: blood pressure returned to baseline and stable Postop Assessment: no signs of nausea or vomiting Anesthetic complications: no    Last Vitals:  Vitals:   09/17/16 1040 09/17/16 1333  BP: 135/77 (!) 159/67  Pulse: (!) 55 61  Resp: 16 11  Temp: 36.6 C 36.6 C    Last Pain:  Vitals:   09/17/16 1333  TempSrc:   PainSc: [place] Tabbetha Kutscher

## 2016-09-17 NOTE — H&P (View-Only) (Signed)
Patient ID: Erica Jackson, female   DOB: 1934/10/07, 82 y.o.   MRN: [id number]  CC: BREAST CANCER  HPI Erica Jackson is a 82 y.o. female presents to clinic today for evaluation of a new diagnosis of left breast cancer. Patient reports that she received her annual mammogram earlier this year which found a new finding her left breast that is our to been biopsied. She has been doing self exams and does not feel anything new or different. She is otherwise in her usual state of health. She reports never using birth control, never having hormone replacement therapy, no prior breast biopsies or any personal history of breast tumors. She does have a significant family history of cancers but none of them were breast. There were in her Brothers. She is otherwise in her usual state of excellent health and desires at this cancer versus possible.  HPI  History reviewed. No pertinent past medical history.  Past Surgical History:  Procedure Laterality Date  . ABDOMINAL HYSTERECTOMY    . BREAST BIOPSY Left 08/28/2016   path pending  . EYE SURGERY  2015    Family History  Problem Relation Age of Onset  . Stroke Mother   . Cancer Sister   . Cancer Brother   . Cancer Daughter   . Cancer Brother   . Cancer Brother   . Cancer Brother   . Cancer Brother   . Cancer Brother   . Breast cancer Neg Hx     Social History Social History  Substance Use Topics  . Smoking status: Former Smoker    Packs/day: 1.00  . Smokeless tobacco: Former Systems developer  . Alcohol use No    Not on File  Current Outpatient Prescriptions  Medication Sig Dispense Refill  . aspirin EC 81 MG tablet Take by mouth.    Marland Kitchen atenolol (TENORMIN) 25 MG tablet TAKE ONE TABLET BY MOUTH ONCE DAILY    . felodipine (PLENDIL) 5 MG 24 hr tablet Take by mouth.    . fluticasone (FLONASE) 50 MCG/ACT nasal spray Place into the nose.    . isosorbide dinitrate (ISORDIL) 20 MG tablet Take by mouth.    . lovastatin (MEVACOR) 10 MG tablet TAKE ONE  TABLET BY MOUTH ONCE DAILY WITH DINNER    . omeprazole (PRILOSEC) 20 MG capsule Take by mouth.    . potassium chloride SA (K-DUR,KLOR-CON) 20 MEQ tablet Take by mouth.     No current facility-administered medications for this visit.      Review of Systems A Multi-point review of systems was asked and was negative except for the findings documented in the history of present illness  Physical Exam Blood pressure (!) 147/73, temperature 98 F (36.7 C), temperature source Oral, height 5\' 5"  (1.651 m), weight 75.5 kg (166 lb 8 oz). CONSTITUTIONAL: No acute distress. EYES: Pupils are equal, round, and reactive to light, Sclera are non-icteric. EARS, NOSE, MOUTH AND THROAT: The oropharynx is clear. The oral mucosa is pink and moist. Hearing is intact to voice. LYMPH NODES:  Lymph nodes in the neck and bilateral axilla are normal. Breast: Bilateral breast examined. There are no palpable dominant masses to either breast. Previous biopsy location of the left breast is well healed without any evidence of infection or fluid collection. RESPIRATORY:  Lungs are clear. There is normal respiratory effort, with equal breath sounds bilaterally, and without pathologic use of accessory muscles. CARDIOVASCULAR: Heart is regular without murmurs, gallops, or rubs. GI: The abdomen is soft, nontender, and  nondistended. There are no palpable masses. There is no hepatosplenomegaly. There are normal bowel sounds in all quadrants. GU: Rectal deferred.   MUSCULOSKELETAL: Normal muscle strength and tone. No cyanosis or edema.   SKIN: Turgor is good and there are no pathologic skin lesions or ulcers. NEUROLOGIC: Motor and sensation is grossly normal. Cranial nerves are grossly intact. PSYCH:  Oriented to person, place and time. Affect is normal.  Data Reviewed Images and labs reviewed. Images of left breast show a 1.1 cm mass at about 10:00. This is biopsied which showed an ER positive, PR negative invasive mammary  breast cancer I have personally reviewed the patient's imaging, laboratory findings and medical records.    Assessment    Left-sided breast cancer.    Plan    82 year old female with a left-sided breast cancer that per imaging is a stage I tumor. There is no evidence of axillary lymphadenopathy on imaging or physical exam. Discussed the diagnosis at length with the patient and her daughters to include her treatment options. Treatment options discussed were lumpectomy with sentinel node, mastectomy with sentinel node, chemotherapy and radiation, no intervention at all. After discussing all these options and the pluses and minuses of them, the patient and her daughters have chosen to proceed with a needle localized lumpectomy with sentinel lymph node. The procedure was again described in detail to include the risks, benefits, alternatives. The primary risks being pain, bleeding, infection, residual tumor requiring additional surgical intervention. The patient and her daughters voiced understanding and all questions drain to their satisfaction. Plan to proceed to surgery on Tuesday, November 14.     Time spent with the patient was 45 minutes, with more than 50% of the time spent in face-to-face education, counseling and care coordination.     Clayburn Pert, MD FACS General Surgeon 09/03/2016, 3:54 PM

## 2016-09-17 NOTE — Interval H&P Note (Signed)
History and Physical Interval Note:  09/17/2016 10:56 AM  Erica Jackson  has presented today for surgery, with the diagnosis of malignant neoplasm left breast  The various methods of treatment have been discussed with the patient and family. After consideration of risks, benefits and other options for treatment, the patient has consented to  Procedure(s): BREAST LUMPECTOMY WITH SENTINEL LYMPH NODE BX (Left) as a surgical intervention .  The patient's history has been reviewed, patient examined, no change in status, stable for surgery.  I have reviewed the patient's chart and labs.  Questions were answered to the patient's satisfaction.     Clayburn Pert

## 2016-09-18 NOTE — Progress Notes (Signed)
  Oncology Nurse Navigator Documentation  Navigator Location: [facility] (09/18/16 1100)   )Navigator Encounter Type: Telephone (09/18/16 1100) Telephone: Outgoing Call (09/18/16 1100)                   Patient Visit Type: Follow-up (09/18/16 1100)                              Time Spent with Patient: 15 (09/18/16 1100)   Phoned patient.  Daughter, [name], states patient is doing well, and she slept so well she forgot to take her glasses off. Reminded of navigation service, and tyo call with any needs.

## 2016-09-19 NOTE — Anesthesia Postprocedure Evaluation (Signed)
Anesthesia Post Note  Patient: Erica Jackson  Procedure(s) Performed: Procedure(s) (LRB): BREAST LUMPECTOMY WITH SENTINEL LYMPH NODE BX (Left)  Patient location during evaluation: PACU Anesthesia Type: General Level of consciousness: awake and alert Pain management: pain level controlled Vital Signs Assessment: post-procedure vital signs reviewed and stable Respiratory status: spontaneous breathing, nonlabored ventilation, respiratory function stable and patient connected to nasal cannula oxygen Cardiovascular status: blood pressure returned to baseline and stable Postop Assessment: no signs of nausea or vomiting Anesthetic complications: no    Last Vitals:  Vitals:   09/17/16 1440 09/17/16 1515  BP: (!) 167/63 (!) 151/64  Pulse: (!) 58   Resp: 18   Temp: 36.7 C     Last Pain:  Vitals:   09/18/16 0831  TempSrc:   PainSc: 0-No pain                 Molli Barrows

## 2016-09-20 LAB — SURGICAL PATHOLOGY

## 2016-09-25 ENCOUNTER — Ambulatory Visit (INDEPENDENT_AMBULATORY_CARE_PROVIDER_SITE_OTHER): Payer: Medicare Other | Admitting: Surgery

## 2016-09-25 ENCOUNTER — Telehealth: Payer: Self-pay

## 2016-09-25 ENCOUNTER — Ambulatory Visit
Admission: RE | Admit: 2016-09-25 | Discharge: 2016-09-25 | Disposition: A | Discharge status: Home / Self Care | Payer: Medicare Other | Source: Ambulatory Visit | Attending: Hematology and Oncology | Admitting: Hematology and Oncology

## 2016-09-25 ENCOUNTER — Encounter: Payer: Self-pay | Admitting: Surgery

## 2016-09-25 VITALS — BP 132/69 | HR 61 | Temp 97.9°F | Ht 65.0 in | Wt 165.0 lb

## 2016-09-25 DIAGNOSIS — Z780 Asymptomatic menopausal state: Secondary | ICD-10-CM | POA: Insufficient documentation

## 2016-09-25 DIAGNOSIS — C50312 Malignant neoplasm of lower-inner quadrant of left female breast: Secondary | ICD-10-CM | POA: Diagnosis not present

## 2016-09-25 DIAGNOSIS — Z170 Estrogen receptor positive status [ER+]: Secondary | ICD-10-CM | POA: Insufficient documentation

## 2016-09-25 DIAGNOSIS — Z09 Encounter for follow-up examination after completed treatment for conditions other than malignant neoplasm: Secondary | ICD-10-CM

## 2016-09-25 NOTE — Telephone Encounter (Signed)
Left message on patients VM foe the follow up appointment with DR.Woodham on 10/07/16 at 9:00 am. Appointment reminder mailed today.

## 2016-09-25 NOTE — Progress Notes (Signed)
S/p left lumpectomy Path reviewed Tumor was ER positiv (> 90%), PR negative, and Her2 2+ (negative by FISH). Margins neg, invasive ductal CA 10cc size, negative lymph node She has no complaints   PE NAD Incisions healing well, no infection. No evidence of large seromas or wound infection.  A/P Left breast CA, neg margins, no further surgical rx required. Pending radiation oncology and oncology f/u counseling provided. F/u w operating surgeon in 2 weeks

## 2016-10-07 ENCOUNTER — Encounter: Payer: Self-pay | Admitting: General Surgery

## 2016-10-07 ENCOUNTER — Ambulatory Visit (INDEPENDENT_AMBULATORY_CARE_PROVIDER_SITE_OTHER): Payer: Medicare Other | Admitting: General Surgery

## 2016-10-07 VITALS — BP 127/83 | HR 62 | Temp 98.1°F | Wt 170.0 lb

## 2016-10-07 DIAGNOSIS — Z4889 Encounter for other specified surgical aftercare: Secondary | ICD-10-CM

## 2016-10-07 NOTE — Patient Instructions (Addendum)

## 2016-10-07 NOTE — Progress Notes (Signed)
Outpatient Surgical Follow Up  10/07/2016  Erica Jackson is an 82 y.o. female.   Chief Complaint  Patient presents with  . Routine Post Op     Left breast Lumpectomy with sentinell node biopsy-09-17-16-DR.Charie Pinkus    HPI: A 82 year old female returns to clinic 3 weeks status post left breast lumpectomy with sentinel lymph node biopsy. She reports doing well since surgery. She denies any pain. She denies any fevers, chills, nausea, vomiting, chest pain, shortness of breath, diarrhea, constipation. She was told her pathology report had a previous visit. She reports doing very well and without any current complaints.  Past Medical History:  Diagnosis Date  . Cataract   . Dysrhythmia    no problems in a long time  . GERD (gastroesophageal reflux disease)   . Headache   . Hypercholesterolemia   . Hypertension     Past Surgical History:  Procedure Laterality Date  . ABDOMINAL HYSTERECTOMY    . BREAST BIOPSY Left 08/28/2016   path pending  . BREAST LUMPECTOMY WITH SENTINEL LYMPH NODE BIOPSY Left 09/17/2016   Procedure: BREAST LUMPECTOMY WITH SENTINEL LYMPH NODE BX;  Surgeon: Clayburn Pert, MD;  Location: [facility] ORS;  Service: General;  Laterality: Left;  . EYE SURGERY Left 2015    Family History  Problem Relation Age of Onset  . Stroke Mother   . Cancer Sister   . Cancer Brother   . Cancer Daughter   . Cancer Brother   . Cancer Brother   . Cancer Brother   . Cancer Brother   . Cancer Brother   . Breast cancer Neg Hx     Social History:  reports that she quit smoking about 27 years ago. She smoked 1.00 pack per day. She quit smokeless tobacco use about 27 years ago. She reports that she does not drink alcohol or use drugs.  Allergies: No Known Allergies  Medications reviewed.    ROS A multipoint review of systems was completed, all pertinent positives and negatives are documented within the history of present illness and remainder are negative.   BP 127/83   Pulse  62   Temp 98.1 F (36.7 C) (Oral)   Wt 77.1 kg (170 lb)   BMI 28.29 kg/m   Physical Exam Gen.: No acute distress Neck: Supple and nontender Breast: Left breast examined with lumpectomy and sentinel lymph node site well approximated with Dermabond still in place. There is no evidence of erythema, drainage, fluid collections. Chest: Clear to auscultation Heart: Regular rate and rhythm Abdomen: Soft and nontender    No results found for this or any previous visit (from the past 48 hour(s)). No results found.  Assessment/Plan:  1. Aftercare following surgery 82 year old female status post left breast lumpectomy with sentinel lymph node biopsy. Doing well. Discussed signs and symptoms of infection and for them to return to clinic immediately should they occur. Again discussed adjuvant therapies for breast cancer. She has a follow-up with radiation oncology tomorrow to discuss whether or not she will undergo radiation. Discussed that this would be medically indicated but the choices ultimately hers. All questions answered to the patient and her family's satisfaction. She'll follow-up in clinic in 5 weeks for additional wound check and breast exam.     Clayburn Pert, MD [facility] General Surgeon  10/07/2016,9:59 AM

## 2016-10-08 ENCOUNTER — Other Ambulatory Visit: Payer: Self-pay | Admitting: *Deleted

## 2016-10-08 ENCOUNTER — Ambulatory Visit
Admission: RE | Admit: 2016-10-08 | Discharge: 2016-10-08 | Disposition: A | Discharge status: Home / Self Care | Payer: Medicare Other | Source: Ambulatory Visit | Attending: Radiation Oncology | Admitting: Radiation Oncology

## 2016-10-08 ENCOUNTER — Encounter: Payer: Self-pay | Admitting: Radiation Oncology

## 2016-10-08 VITALS — BP 132/73 | HR 69 | Temp 97.7°F | Resp 20 | Wt 170.0 lb

## 2016-10-08 DIAGNOSIS — Z79899 Other long term (current) drug therapy: Secondary | ICD-10-CM | POA: Insufficient documentation

## 2016-10-08 DIAGNOSIS — Z170 Estrogen receptor positive status [ER+]: Secondary | ICD-10-CM | POA: Diagnosis not present

## 2016-10-08 DIAGNOSIS — C50312 Malignant neoplasm of lower-inner quadrant of left female breast: Secondary | ICD-10-CM | POA: Insufficient documentation

## 2016-10-08 DIAGNOSIS — I499 Cardiac arrhythmia, unspecified: Secondary | ICD-10-CM | POA: Insufficient documentation

## 2016-10-08 DIAGNOSIS — Z809 Family history of malignant neoplasm, unspecified: Secondary | ICD-10-CM | POA: Insufficient documentation

## 2016-10-08 DIAGNOSIS — Z7982 Long term (current) use of aspirin: Secondary | ICD-10-CM | POA: Diagnosis not present

## 2016-10-08 DIAGNOSIS — K219 Gastro-esophageal reflux disease without esophagitis: Secondary | ICD-10-CM | POA: Insufficient documentation

## 2016-10-08 DIAGNOSIS — I10 Essential (primary) hypertension: Secondary | ICD-10-CM | POA: Insufficient documentation

## 2016-10-08 DIAGNOSIS — Z510 Encounter for antineoplastic radiation therapy: Secondary | ICD-10-CM | POA: Diagnosis not present

## 2016-10-08 DIAGNOSIS — Z87891 Personal history of nicotine dependence: Secondary | ICD-10-CM | POA: Insufficient documentation

## 2016-10-08 DIAGNOSIS — E7800 Pure hypercholesterolemia, unspecified: Secondary | ICD-10-CM | POA: Insufficient documentation

## 2016-10-08 HISTORY — DX: Malignant neoplasm of unspecified site of unspecified female breast: C50.919

## 2016-10-08 NOTE — Consult Note (Signed)
NEW PATIENT EVALUATION  Name: Erica Jackson  MRN: [id number]  Date:   10/08/2016     DOB: 1934/08/29   This 82 y.o. 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  REFERRING PHYSICIAN: Sherrin Daisy, MD  CHIEF COMPLAINT:  Chief Complaint  Patient presents with  . Breast Cancer    Pt is here for initial consultation of breast cancer.      DIAGNOSIS: The encounter diagnosis was Malignant neoplasm of lower-inner quadrant of left breast in female, estrogen receptor positive ([place]).   PREVIOUS INVESTIGATIONS:  Mammogram and ultrasound reviewed Pathology reports reviewed Clinical notes reviewed  HPI: Patient is an 82 year old female who presented with an abnormal mammogram of her left breast showing a mass measuring 1.1 cm in the 10:00 position suspicious for malignancy confirmed on ultrasound. She underwent ultrasound-guided biopsy which was positive for invasive mammary carcinoma. Tumor was strongly ER/PR positive PR negative HER-2/neu not overexpressed. She underwent wide local excision and sentinel node biopsy again showing overall grade 2 invasive mammary carcinoma measuring 1 cm with margins clear at 5 mm. One sentinel lymph node was negative for metastatic disease. She has done well postoperatively is now referred to radiation oncology for consideration of treatment. She is healing well she specifically denies breast tenderness cough or bone pain.  PLANNED TREATMENT REGIMEN: Whole breast radiation  PAST MEDICAL HISTORY:  has a past medical history of Breast cancer ([place]); Cataract; Dysrhythmia; GERD (gastroesophageal reflux disease); Headache; Hypercholesterolemia; and Hypertension.    PAST SURGICAL HISTORY:  Past Surgical History:  Procedure Laterality Date  . ABDOMINAL HYSTERECTOMY    . BREAST  BIOPSY Left 08/28/2016   path pending  . BREAST LUMPECTOMY WITH SENTINEL LYMPH NODE BIOPSY Left 09/17/2016   Procedure: BREAST LUMPECTOMY WITH SENTINEL LYMPH NODE BX;  Surgeon: Clayburn Pert, MD;  Location: [facility] ORS;  Service: General;  Laterality: Left;  . EYE SURGERY Left 2015    FAMILY HISTORY: family history includes Cancer in her brother, brother, brother, brother, brother, brother, daughter, and sister; Stroke in her mother.  SOCIAL HISTORY:  reports that she quit smoking about 27 years ago. She smoked 1.00 pack per day. She quit smokeless tobacco use about 27 years ago. She reports that she does not drink alcohol or use drugs.  ALLERGIES: Patient has no known allergies.  MEDICATIONS:  Current Outpatient Prescriptions  Medication Sig Dispense Refill  . aspirin EC 81 MG tablet Take by mouth.    Marland Kitchen atenolol (TENORMIN) 25 MG tablet TAKE ONE TABLET BY MOUTH ONCE DAILY    . docusate sodium (COLACE) 100 MG capsule Take 1 capsule (100 mg total) by mouth 2 (two) times daily. 10 capsule 0  . felodipine (PLENDIL) 5 MG 24 hr tablet Take by mouth.    . fluticasone (FLONASE) 50 MCG/ACT nasal spray Place into the nose as needed.     Marland Kitchen HYDROcodone-acetaminophen (NORCO) 5-325 MG tablet Take 1 tablet by mouth every 6 (six) hours as needed for moderate pain. 30 tablet 0  . isosorbide dinitrate (ISORDIL) 20 MG tablet Take by mouth.    . lovastatin (MEVACOR) 10 MG tablet TAKE ONE TABLET BY MOUTH ONCE DAILY WITH DINNER    . omeprazole (PRILOSEC) 20 MG capsule Take by mouth.    . ondansetron (ZOFRAN ODT) 4 MG disintegrating tablet Take 1 tablet (4  mg total) by mouth every 8 (eight) hours as needed for nausea or vomiting. 20 tablet 0  . potassium chloride SA (K-DUR,KLOR-CON) 20 MEQ tablet Take by mouth.     No current facility-administered medications for this encounter.     ECOG PERFORMANCE STATUS:  0 - Asymptomatic  REVIEW OF SYSTEMS:  Patient denies any weight loss, fatigue, weakness, fever,  chills or night sweats. Patient denies any loss of vision, blurred vision. Patient denies any ringing  of the ears or hearing loss. No irregular heartbeat. Patient denies heart murmur or history of fainting. Patient denies any chest pain or pain radiating to her upper extremities. Patient denies any shortness of breath, difficulty breathing at night, cough or hemoptysis. Patient denies any swelling in the lower legs. Patient denies any nausea vomiting, vomiting of blood, or coffee ground material in the vomitus. Patient denies any stomach pain. Patient states has had normal bowel movements no significant constipation or diarrhea. Patient denies any dysuria, hematuria or significant nocturia. Patient denies any problems walking, swelling in the joints or loss of balance. Patient denies any skin changes, loss of hair or loss of weight. Patient denies any excessive worrying or anxiety or significant depression. Patient denies any problems with insomnia. Patient denies excessive thirst, polyuria, polydipsia. Patient denies any swollen glands, patient denies easy bruising or easy bleeding. Patient denies any recent infections, allergies or URI. Patient "s visual fields have not changed significantly in recent time.    PHYSICAL EXAM: BP 132/73   Pulse 69   Temp 97.7 F (36.5 C)   Resp 20   Wt 169 lb 15.6 oz (77.1 kg)   BMI 28.29 kg/m  Patient is status post wide local excision of left breast which is healing well as well as sentinel lymph node biopsy. No dominant mass or nodularity is noted in either breast in 2 positions examined. No axillary or supraclavicular adenopathy is appreciated. Well-developed well-nourished patient in NAD. HEENT reveals PERLA, EOMI, discs not visualized.  Oral cavity is clear. No oral mucosal lesions are identified. Neck is clear without evidence of cervical or supraclavicular adenopathy. Lungs are clear to A&P. Cardiac examination is essentially unremarkable with regular rate and  rhythm without murmur rub or thrill. Abdomen is benign with no organomegaly or masses noted. Motor sensory and DTR levels are equal and symmetric in the upper and lower extremities. Cranial nerves II through XII are grossly intact. Proprioception is intact. No peripheral adenopathy or edema is identified. No motor or sensory levels are noted. Crude visual fields are within normal range.  LABORATORY DATA: Pathology reports reviewed    RADIOLOGY RESULTS: Ultrasound and mammograms reviewed and compatible above-stated findings   IMPRESSION: Stage I invasive mammary carcinoma of the left breast in 82 year old female status post wide local excision and sentinel node biopsy  PLAN: At this time I got over treatment recommendations. Although there is controversy as far as treating patients 65 years and older I still believe it is the standard of care for invasive mammary carcinoma and would offer hypofractionated course of radiation over 4 weeks to her left breast. Risks and benefits of treatment including skin reaction fatigue alteration of blood counts and possible inclusion of superficial lung all were described in detail to the patient and her 2 daughters. I have tentatively set her up for simulation. I have discussed that based on her age positive PR status she certainly consider anti-estrogen therapy. Patient and her family will make decision about her radiation treatments over the  next week. Simulation date was personally ordered and scheduled. Patient family know to call with any concerns or questions.  I would like to take this opportunity to thank you for allowing me to participate in the care of your patient.Armstead Peaks., MD

## 2016-10-09 ENCOUNTER — Inpatient Hospital Stay (HOSPITAL_BASED_OUTPATIENT_CLINIC_OR_DEPARTMENT_OTHER): Payer: Medicare Other | Admitting: Hematology and Oncology

## 2016-10-09 ENCOUNTER — Inpatient Hospital Stay: Payer: Medicare Other | Attending: Hematology and Oncology

## 2016-10-09 VITALS — BP 111/68 | HR 64 | Temp 98.2°F | Resp 18 | Wt 168.4 lb

## 2016-10-09 DIAGNOSIS — Z79899 Other long term (current) drug therapy: Secondary | ICD-10-CM

## 2016-10-09 DIAGNOSIS — Z87891 Personal history of nicotine dependence: Secondary | ICD-10-CM | POA: Diagnosis not present

## 2016-10-09 DIAGNOSIS — Z809 Family history of malignant neoplasm, unspecified: Secondary | ICD-10-CM | POA: Insufficient documentation

## 2016-10-09 DIAGNOSIS — Z170 Estrogen receptor positive status [ER+]: Secondary | ICD-10-CM | POA: Insufficient documentation

## 2016-10-09 DIAGNOSIS — E7800 Pure hypercholesterolemia, unspecified: Secondary | ICD-10-CM | POA: Insufficient documentation

## 2016-10-09 DIAGNOSIS — I10 Essential (primary) hypertension: Secondary | ICD-10-CM | POA: Diagnosis not present

## 2016-10-09 DIAGNOSIS — K219 Gastro-esophageal reflux disease without esophagitis: Secondary | ICD-10-CM | POA: Insufficient documentation

## 2016-10-09 DIAGNOSIS — R7989 Other specified abnormal findings of blood chemistry: Secondary | ICD-10-CM | POA: Insufficient documentation

## 2016-10-09 DIAGNOSIS — Z7982 Long term (current) use of aspirin: Secondary | ICD-10-CM | POA: Diagnosis not present

## 2016-10-09 DIAGNOSIS — C50412 Malignant neoplasm of upper-outer quadrant of left female breast: Secondary | ICD-10-CM

## 2016-10-09 DIAGNOSIS — N2889 Other specified disorders of kidney and ureter: Secondary | ICD-10-CM | POA: Diagnosis not present

## 2016-10-09 DIAGNOSIS — C50312 Malignant neoplasm of lower-inner quadrant of left female breast: Secondary | ICD-10-CM

## 2016-10-09 DIAGNOSIS — C50212 Malignant neoplasm of upper-inner quadrant of left female breast: Secondary | ICD-10-CM

## 2016-10-09 LAB — COMPREHENSIVE METABOLIC PANEL WITH GFR
ALT: 12 U/L — ABNORMAL LOW (ref 14–54)
AST: 16 U/L (ref 15–41)
Albumin: 3.6 g/dL (ref 3.5–5.0)
Alkaline Phosphatase: 93 U/L (ref 38–126)
Anion gap: 4 — ABNORMAL LOW (ref 5–15)
BUN: 15 mg/dL (ref 6–20)
CO2: 26 mmol/L (ref 22–32)
Calcium: 9.1 mg/dL (ref 8.9–10.3)
Chloride: 106 mmol/L (ref 101–111)
Creatinine, Ser: 1.28 mg/dL — ABNORMAL HIGH (ref 0.44–1.00)
GFR calc Af Amer: 45 mL/min — ABNORMAL LOW
GFR calc non Af Amer: 38 mL/min — ABNORMAL LOW
Glucose, Bld: 100 mg/dL — ABNORMAL HIGH (ref 65–99)
Potassium: 4.1 mmol/L (ref 3.5–5.1)
Sodium: 136 mmol/L (ref 135–145)
Total Bilirubin: 0.4 mg/dL (ref 0.3–1.2)
Total Protein: 7.8 g/dL (ref 6.5–8.1)

## 2016-10-09 LAB — CBC WITH DIFFERENTIAL/PLATELET
Basophils Absolute: 0 K/uL (ref 0–0.1)
Basophils Relative: 1 %
Eosinophils Absolute: 0 K/uL (ref 0–0.7)
Eosinophils Relative: 1 %
HCT: 36.1 % (ref 35.0–47.0)
Hemoglobin: 11.9 g/dL — ABNORMAL LOW (ref 12.0–16.0)
Lymphocytes Relative: 26 %
Lymphs Abs: 1.6 K/uL (ref 1.0–3.6)
MCH: 30.3 pg (ref 26.0–34.0)
MCHC: 32.8 g/dL (ref 32.0–36.0)
MCV: 92.3 fL (ref 80.0–100.0)
Monocytes Absolute: 0.3 K/uL (ref 0.2–0.9)
Monocytes Relative: 6 %
Neutro Abs: 4.1 K/uL (ref 1.4–6.5)
Neutrophils Relative %: 68 %
Platelets: 219 K/uL (ref 150–440)
RBC: 3.91 MIL/uL (ref 3.80–5.20)
RDW: 14.5 % (ref 11.5–14.5)
WBC: 6.1 K/uL (ref 3.6–11.0)

## 2016-10-09 NOTE — Progress Notes (Signed)
Patient offers no complaints today.  Has not had new diagnosis since last visit.

## 2016-10-09 NOTE — Patient Instructions (Signed)
Letrozole tablets What is this medicine? LETROZOLE (LET roe zole) blocks the production of estrogen. It is used to treat breast cancer. This medicine may be used for other purposes; ask your health care provider or pharmacist if you have questions. COMMON BRAND NAME(S): Femara What should I tell my health care provider before I take this medicine? They need to know if you have any of these conditions: -high cholesterol -liver disease -osteoporosis (weak bones) -an unusual or allergic reaction to letrozole, other medicines, foods, dyes, or preservatives -pregnant or trying to get pregnant -breast-feeding How should I use this medicine? Take this medicine by mouth with a glass of water. You may take it with or without food. Follow the directions on the prescription label. Take your medicine at regular intervals. Do not take your medicine more often than directed. Do not stop taking except on your doctor's advice. Talk to your pediatrician regarding the use of this medicine in children. Special care may be needed. Overdosage: If you think you have taken too much of this medicine contact a poison control center or emergency room at once. NOTE: This medicine is only for you. Do not share this medicine with others. What if I miss a dose? If you miss a dose, take it as soon as you can. If it is almost time for your next dose, take only that dose. Do not take double or extra doses. What may interact with this medicine? Do not take this medicine with any of the following medications: -estrogens, like hormone replacement therapy or birth control pills This medicine may also interact with the following medications: -dietary supplements such as androstenedione or DHEA -prasterone -tamoxifen This list may not describe all possible interactions. Give your health care provider a list of all the medicines, herbs, non-prescription drugs, or dietary supplements you use. Also tell them if you smoke, drink  alcohol, or use illegal drugs. Some items may interact with your medicine. What should I watch for while using this medicine? Tell your doctor or healthcare professional if your symptoms do not start to get better or if they get worse. Do not become pregnant while taking this medicine or for 3 weeks after stopping it. Women should inform their doctor if they wish to become pregnant or think they might be pregnant. There is a potential for serious side effects to an unborn child. Talk to your health care professional or pharmacist for more information. Do not breast-feed while taking this medicine or for 3 weeks after stopping it. This medicine may interfere with the ability to have a child. Talk with your doctor or health care professional if you are concerned about your fertility. Using this medicine for a long time may increase your risk of low bone mass. Talk to your doctor about bone health. You may get drowsy or dizzy. Do not drive, use machinery, or do anything that needs mental alertness until you know how this medicine affects you. Do not stand or sit up quickly, especially if you are an older patient. This reduces the risk of dizzy or fainting spells. You may need blood work done while you are taking this medicine. What side effects may I notice from receiving this medicine? Side effects that you should report to your doctor or health care professional as soon as possible: -allergic reactions like skin rash, itching, or hives -bone fracture -chest pain -signs and symptoms of a blood clot such as breathing problems; changes in vision; chest pain; severe, sudden headache; pain, swelling,   warmth in the leg; trouble speaking; sudden numbness or weakness of the face, arm or leg -vaginal bleeding Side effects that usually do not require medical attention (report to your doctor or health care professional if they continue or are bothersome): -bone, back, joint, or muscle  pain -dizziness -fatigue -fluid retention -headache -hot flashes, night sweats -nausea -weight gain This list may not describe all possible side effects. Call your doctor for medical advice about side effects. You may report side effects to FDA at 1-800-FDA-1088. Where should I keep my medicine? Keep out of the reach of children. Store between 15 and 30 degrees C (59 and 86 degrees F). Throw away any unused medicine after the expiration date. NOTE: This sheet is a summary. It may not cover all possible information. If you have questions about this medicine, talk to your doctor, pharmacist, or health care provider.  2017 [employer] Standard (2016-05-27 11:10:41)

## 2016-10-09 NOTE — Progress Notes (Signed)
[facility] day:  10/09/2016  Chief Complaint: Erica Jackson is a 82 y.o. female with stage I left breast cancer who is seen for for assessment after interval lumpectomy and sentinel lymph node biopsy.  HPI:  The patient was last seen in the medical oncology clinic on 09/11/2016.  At that time, she was seen for initial evaluation.  Left breast biopsy revealed a grade II invasive mammary carcinoma.  She was scheduled for surgery.  She underwent lumpectomy and sentinel lymph node biopsy on 09/17/2016 by Dr. Adonis Huguenin.  Pathology revealed a 1.0 cm grade II invasive mammary carcinoma of no special type.  Margins were negative.  There was no tumor in 1 sentinel lymph node.  Pathologic stage was T1bN0M0.  Bone density study on 09/25/2016 was normal with a T score of 0.4 in the left femoral neck.    Symptomatically, she feels well.  She states that she undergoes simulation for radiation on 10/15/2016.     Past Medical History:  Diagnosis Date  . Breast cancer ([place])    Lower inner quadrant left breast  . Cataract   . Dysrhythmia    no problems in a long time  . GERD (gastroesophageal reflux disease)   . Headache   . Hypercholesterolemia   . Hypertension     Past Surgical History:  Procedure Laterality Date  . ABDOMINAL HYSTERECTOMY    . BREAST BIOPSY Left 08/28/2016   path pending  . BREAST LUMPECTOMY WITH SENTINEL LYMPH NODE BIOPSY Left 09/17/2016   Procedure: BREAST LUMPECTOMY WITH SENTINEL LYMPH NODE BX;  Surgeon: Clayburn Pert, MD;  Location: [facility] ORS;  Service: General;  Laterality: Left;  . EYE SURGERY Left 2015    Family History  Problem Relation Age of Onset  . Stroke Mother   . Cancer Sister   . Cancer Brother   . Cancer Daughter   . Cancer Brother   . Cancer Brother   . Cancer Brother   . Cancer Brother   . Cancer Brother   . Breast cancer Neg Hx    The patient notes longevity in her family.  Her aunt lived to 16.  Her  great grandmother lived to 15.  Social History:  reports that she quit smoking about 27 years ago. She smoked 1.00 pack per day. She quit smokeless tobacco use about 27 years ago. She reports that she does not drink alcohol or use drugs.  The patient previously smoked 1 pack/week for 5-10 years.  She stopped smoking in 1990.  The patient is alone today.  Allergies: No Known Allergies  Current Medications: Current Outpatient Prescriptions  Medication Sig Dispense Refill  . aspirin EC 81 MG tablet Take by mouth.    Marland Kitchen atenolol (TENORMIN) 25 MG tablet TAKE ONE TABLET BY MOUTH ONCE DAILY    . docusate sodium (COLACE) 100 MG capsule Take 1 capsule (100 mg total) by mouth 2 (two) times daily. 10 capsule 0  . felodipine (PLENDIL) 5 MG 24 hr tablet Take by mouth.    . fluticasone (FLONASE) 50 MCG/ACT nasal spray Place into the nose as needed.     . isosorbide dinitrate (ISORDIL) 20 MG tablet Take by mouth.    . lovastatin (MEVACOR) 10 MG tablet TAKE ONE TABLET BY MOUTH ONCE DAILY WITH DINNER    . omeprazole (PRILOSEC) 20 MG capsule Take by mouth.    . potassium chloride SA (K-DUR,KLOR-CON) 20 MEQ tablet Take by mouth.    Marland Kitchen  HYDROcodone-acetaminophen (NORCO) 5-325 MG tablet Take 1 tablet by mouth every 6 (six) hours as needed for moderate pain. (Patient not taking: Reported on 10/09/2016) 30 tablet 0  . ondansetron (ZOFRAN ODT) 4 MG disintegrating tablet Take 1 tablet (4 mg total) by mouth every 8 (eight) hours as needed for nausea or vomiting. (Patient not taking: Reported on 10/09/2016) 20 tablet 0   No current facility-administered medications for this visit.     Review of Systems:  GENERAL:  Feels good.  No fevers or sweats.  Weight up 3 pounds. PERFORMANCE STATUS (ECOG):  1 HEENT:  No visual changes, runny nose, sore throat, mouth sores or tenderness. Lungs: No shortness of breath or cough.  No hemoptysis. Cardiac:  No chest pain, palpitations, orthopnea, or PND. GI:  No nausea, vomiting,  diarrhea, constipation, melena or hematochezia.  Colonoscopy < 10 years ago.   GU:  No urgency, frequency, dysuria, or hematuria. Musculoskeletal:  No back pain.  No joint pain.  No muscle tenderness. Extremities:  No pain or swelling. Skin:  No rashes or skin changes. Neuro:  Headache once in awhile.  No numbness or weakness, balance or coordination issues. Endocrine:  No diabetes, thyroid issues, hot flashes or night sweats. Psych:  No mood changes, depression or anxiety. Pain:  No focal pain. Review of systems:  All other systems reviewed and found to be negative.  Physical Exam: Blood pressure 111/68, pulse 64, temperature 98.2 F (36.8 C), temperature source Tympanic, resp. rate 18, weight 168 lb 6.9 oz (76.4 kg). GENERAL:  Well developed, well nourished, elderly woman sitting comfortably in the exam room in no acute distress. MENTAL STATUS:  Alert and oriented to person, place and time. HEAD:  Pearline Cables hair.  Normocephalic, atraumatic, face symmetric, no Cushingoid features. EYES:  Brown eyes.  Amblyopia.  Pupils equal round and reactive to light and accomodation.  No conjunctivitis or scleral icterus. ENT:  Oropharynx clear without lesion.  Tongue normal.  Upper and lower dentures.  Mucous membranes moist.  RESPIRATORY:  Clear to auscultation without rales, wheezes or rhonchi. CARDIOVASCULAR:  Regular rate and rhythm without murmur, rub or gallop. BREAST:  Fibrocystic changes bilaterally.  Right breast without masses, skin changes or nipple discharge.  Left breast with superior incision and edema.  No skin changes or nipple discharge.  ABDOMEN:  Soft, non-tender, with active bowel sounds, and no hepatosplenomegaly.  No masses. SKIN:  No rashes, ulcers or lesions. EXTREMITIES: No edema, no skin discoloration or tenderness.  No palpable cords. LYMPH NODES:  Left axillary ridge.  No palpable cervical, supraclavicular, axillary or inguinal adenopathy  NEUROLOGICAL: Unremarkable. PSYCH:   Appropriate.   Appointment on 10/09/2016  Component Date Value Ref Range Status  . WBC 10/09/2016 6.1  3.6 - 11.0 K/uL Final  . RBC 10/09/2016 3.91  3.80 - 5.20 MIL/uL Final  . Hemoglobin 10/09/2016 11.9* 12.0 - 16.0 g/dL Final  . HCT 10/09/2016 36.1  35.0 - 47.0 % Final  . MCV 10/09/2016 92.3  80.0 - 100.0 fL Final  . MCH 10/09/2016 30.3  26.0 - 34.0 pg Final  . MCHC 10/09/2016 32.8  32.0 - 36.0 g/dL Final  . RDW 10/09/2016 14.5  11.5 - 14.5 % Final  . Platelets 10/09/2016 219  150 - 440 K/uL Final  . Neutrophils Relative % 10/09/2016 68  % Final  . Neutro Abs 10/09/2016 4.1  1.4 - 6.5 K/uL Final  . Lymphocytes Relative 10/09/2016 26  % Final  . Lymphs Abs 10/09/2016 1.6  1.0 - 3.6 K/uL Final  . Monocytes Relative 10/09/2016 6  % Final  . Monocytes Absolute 10/09/2016 0.3  0.2 - 0.9 K/uL Final  . Eosinophils Relative 10/09/2016 1  % Final  . Eosinophils Absolute 10/09/2016 0.0  0 - 0.7 K/uL Final  . Basophils Relative 10/09/2016 1  % Final  . Basophils Absolute 10/09/2016 0.0  0 - 0.1 K/uL Final  . Sodium 10/09/2016 136  135 - 145 mmol/L Final  . Potassium 10/09/2016 4.1  3.5 - 5.1 mmol/L Final  . Chloride 10/09/2016 106  101 - 111 mmol/L Final  . CO2 10/09/2016 26  22 - 32 mmol/L Final  . Glucose, Bld 10/09/2016 100* 65 - 99 mg/dL Final  . BUN 10/09/2016 15  6 - 20 mg/dL Final  . Creatinine, Ser 10/09/2016 1.28* 0.44 - 1.00 mg/dL Final  . Calcium 10/09/2016 9.1  8.9 - 10.3 mg/dL Final  . Total Protein 10/09/2016 7.8  6.5 - 8.1 g/dL Final  . Albumin 10/09/2016 3.6  3.5 - 5.0 g/dL Final  . AST 10/09/2016 16  15 - 41 U/L Final  . ALT 10/09/2016 12* 14 - 54 U/L Final  . Alkaline Phosphatase 10/09/2016 93  38 - 126 U/L Final  . Total Bilirubin 10/09/2016 0.4  0.3 - 1.2 mg/dL Final  . GFR calc non Af Amer 10/09/2016 38* >60 mL/min Final  . GFR calc Af Amer 10/09/2016 45* >60 mL/min Final   Comment: (NOTE) The eGFR has been calculated using the CKD EPI equation. This calculation  has not been validated in all clinical situations. eGFR's persistently <60 mL/min signify possible Chronic Kidney Disease.   . Anion gap 10/09/2016 4* 5 - 15 Final    Assessment:  Erica Jackson is a 82 y.o. female with stage I left breast cancer s/p lumpectomy and sentinel lymph node biopsy on 09/17/2016.  Pathology revealed a 1.0 cm grade II invasive mammary carcinoma of no special type.  Margins were negative.  There was no tumor in 1 sentinel lymph node.  Pathologic stage was T1bN0M0.  Mammogram and ultrasound on 08/16/2016 revealed a 1.1 x 0.6 x 0.8 cm mass at the 10 o'clock position.  Left breast biopsy on 08/28/2016 revealed a 0.9 cm grade II invasive mammary carcinoma.  Tumor was ER positive (> 90%), PR negative, and Her2 2+ (negative by FISH).  CA27.29 was 21.7 on 09/11/2016.  Bone density study on 09/25/2016 was normal with a T score of 0.4 in the left femoral neck.   Patient has a history of an elevated protein (8.3 on 09/11/2016) with a normal albumen.  She has mild renal insufficiency (Cr 1.28; CrCl 45 ml/min).  Symptomatically, she denies any complaints.  Exam reveals post-operative changes.  Plan: 1.  Discuss pathology.  Stage I breast cancer.  Discuss plans for radiation followed by hormonal therapy.  Discuss tamoxifen versus aromatase inhibitors.  Side effects reviewed.  Information on Femara provided. 2.  Discuss bone density study.  Bone density study is normal. 3.  RTC after completion of radiation (patient to call) for MD assessment, labs (CBC with diff, CMP, SPEP), and initiation of hormonal therapy.   Lequita Asal, MD  10/09/2016, 3:05 PM

## 2016-10-15 ENCOUNTER — Ambulatory Visit
Admission: RE | Admit: 2016-10-15 | Discharge: 2016-10-15 | Disposition: A | Discharge status: Home / Self Care | Payer: Medicare Other | Source: Ambulatory Visit | Attending: Radiation Oncology | Admitting: Radiation Oncology

## 2016-10-15 DIAGNOSIS — C50312 Malignant neoplasm of lower-inner quadrant of left female breast: Secondary | ICD-10-CM | POA: Diagnosis not present

## 2016-10-17 ENCOUNTER — Encounter: Payer: Self-pay | Admitting: Hematology and Oncology

## 2016-10-23 ENCOUNTER — Other Ambulatory Visit: Payer: Self-pay | Admitting: *Deleted

## 2016-10-23 DIAGNOSIS — C50312 Malignant neoplasm of lower-inner quadrant of left female breast: Secondary | ICD-10-CM | POA: Diagnosis not present

## 2016-10-23 DIAGNOSIS — C50212 Malignant neoplasm of upper-inner quadrant of left female breast: Secondary | ICD-10-CM

## 2016-10-23 DIAGNOSIS — Z170 Estrogen receptor positive status [ER+]: Principal | ICD-10-CM

## 2016-10-31 ENCOUNTER — Ambulatory Visit: Payer: Medicare Other

## 2016-11-01 ENCOUNTER — Ambulatory Visit
Admission: RE | Admit: 2016-11-01 | Discharge: 2016-11-01 | Disposition: A | Discharge status: Home / Self Care | Payer: Medicare Other | Source: Ambulatory Visit | Attending: Radiation Oncology | Admitting: Radiation Oncology

## 2016-11-01 DIAGNOSIS — C50312 Malignant neoplasm of lower-inner quadrant of left female breast: Secondary | ICD-10-CM | POA: Diagnosis not present

## 2016-11-04 DIAGNOSIS — Z923 Personal history of irradiation: Secondary | ICD-10-CM

## 2016-11-04 HISTORY — DX: Personal history of irradiation: Z92.3

## 2016-11-05 ENCOUNTER — Ambulatory Visit
Admission: RE | Admit: 2016-11-05 | Discharge: 2016-11-05 | Disposition: A | Discharge status: Home / Self Care | Payer: Medicare Other | Source: Ambulatory Visit | Attending: Radiation Oncology | Admitting: Radiation Oncology

## 2016-11-05 DIAGNOSIS — C50312 Malignant neoplasm of lower-inner quadrant of left female breast: Secondary | ICD-10-CM | POA: Diagnosis not present

## 2016-11-06 ENCOUNTER — Ambulatory Visit: Payer: Medicare Other

## 2016-11-06 ENCOUNTER — Ambulatory Visit
Admission: RE | Admit: 2016-11-06 | Discharge: 2016-11-06 | Disposition: A | Discharge status: Home / Self Care | Payer: Medicare Other | Source: Ambulatory Visit | Attending: Radiation Oncology | Admitting: Radiation Oncology

## 2016-11-06 DIAGNOSIS — C50312 Malignant neoplasm of lower-inner quadrant of left female breast: Secondary | ICD-10-CM | POA: Diagnosis not present

## 2016-11-07 ENCOUNTER — Ambulatory Visit: Payer: Medicare Other

## 2016-11-07 ENCOUNTER — Ambulatory Visit
Admission: RE | Admit: 2016-11-07 | Discharge: 2016-11-07 | Disposition: A | Discharge status: Home / Self Care | Payer: Medicare Other | Source: Ambulatory Visit | Attending: Radiation Oncology | Admitting: Radiation Oncology

## 2016-11-07 DIAGNOSIS — C50312 Malignant neoplasm of lower-inner quadrant of left female breast: Secondary | ICD-10-CM | POA: Diagnosis not present

## 2016-11-08 ENCOUNTER — Ambulatory Visit: Payer: Medicare Other

## 2016-11-08 ENCOUNTER — Ambulatory Visit
Admission: RE | Admit: 2016-11-08 | Discharge: 2016-11-08 | Disposition: A | Discharge status: Home / Self Care | Payer: Medicare Other | Source: Ambulatory Visit | Attending: Radiation Oncology | Admitting: Radiation Oncology

## 2016-11-08 DIAGNOSIS — C50312 Malignant neoplasm of lower-inner quadrant of left female breast: Secondary | ICD-10-CM | POA: Diagnosis not present

## 2016-11-11 ENCOUNTER — Other Ambulatory Visit: Payer: Self-pay

## 2016-11-11 ENCOUNTER — Encounter: Payer: Self-pay | Admitting: General Surgery

## 2016-11-11 ENCOUNTER — Ambulatory Visit: Payer: Medicare Other

## 2016-11-11 ENCOUNTER — Ambulatory Visit
Admission: RE | Admit: 2016-11-11 | Discharge: 2016-11-11 | Disposition: A | Discharge status: Home / Self Care | Payer: Medicare Other | Source: Ambulatory Visit | Attending: Radiation Oncology | Admitting: Radiation Oncology

## 2016-11-11 ENCOUNTER — Ambulatory Visit (INDEPENDENT_AMBULATORY_CARE_PROVIDER_SITE_OTHER): Payer: Medicare Other | Admitting: General Surgery

## 2016-11-11 VITALS — BP 105/62 | HR 69 | Temp 98.1°F | Ht 65.0 in | Wt 170.2 lb

## 2016-11-11 DIAGNOSIS — Z4889 Encounter for other specified surgical aftercare: Secondary | ICD-10-CM

## 2016-11-11 DIAGNOSIS — C50312 Malignant neoplasm of lower-inner quadrant of left female breast: Secondary | ICD-10-CM | POA: Diagnosis not present

## 2016-11-11 NOTE — Progress Notes (Signed)
Outpatient Surgical Follow Up  11/11/2016  Erica Jackson is an 83 y.o. female.   Chief Complaint  Patient presents with  . Routine Post Op    Left Breast Lumpectomy with Sentinel Node biopsy (09/17/16)- Dr. Adonis Huguenin    HPI: A 83 year old female returns to clinic 6 weeks status post left breast lumpectomy with sentinel lymph node biopsy. Has been doing very well. Patient reports that she is having some soreness to the left breast that started after she initiated radiation therapy. She denies any fevers, chills, nausea, vomiting, chest pain, shortness of breath, diarrhea, constipation.  Past Medical History:  Diagnosis Date  . Breast cancer ([place])    Lower inner quadrant left breast  . Cataract   . Dysrhythmia    no problems in a long time  . GERD (gastroesophageal reflux disease)   . Headache   . Hypercholesterolemia   . Hypertension     Past Surgical History:  Procedure Laterality Date  . ABDOMINAL HYSTERECTOMY    . BREAST BIOPSY Left 08/28/2016   path pending  . BREAST LUMPECTOMY WITH SENTINEL LYMPH NODE BIOPSY Left 09/17/2016   Procedure: BREAST LUMPECTOMY WITH SENTINEL LYMPH NODE BX;  Surgeon: Clayburn Pert, MD;  Location: [facility] ORS;  Service: General;  Laterality: Left;  . EYE SURGERY Left 2015    Family History  Problem Relation Age of Onset  . Stroke Mother   . Cancer Sister   . Cancer Brother   . Cancer Daughter   . Cancer Brother   . Cancer Brother   . Cancer Brother   . Cancer Brother   . Cancer Brother   . Breast cancer Neg Hx     Social History:  reports that she quit smoking about 27 years ago. She smoked 1.00 pack per day. She quit smokeless tobacco use about 27 years ago. She reports that she does not drink alcohol or use drugs.  Allergies: No Known Allergies  Medications reviewed.    ROS A multipoint review of systems was completed. All pertinent positives and negatives are documented within the history of present illness remainder  negative.   BP 105/62   Pulse 69   Temp 98.1 F (36.7 C) (Oral)   Ht 5\' 5"  (1.651 m)   Wt 77.2 kg (170 lb 3.2 oz)   BMI 28.32 kg/m   Physical Exam Gen.: No acute distress Chest: Clear to auscultation Heart: Regular rhythm Breast: Bilateral breast exam performed today. Well healed left breast excision site. No dominant masses or palpable abnormalities on either breast. Left axilla examined with well-healed axillary lymph node biopsy site.    No results found for this or any previous visit (from the past 48 hour(s)). No results found.  Assessment/Plan:  1. Aftercare following surgery 83 year old female 6 weeks status post left breast lumpectomy with sentinel lymph node biopsy for breast cancer. Currently undergoing radiation therapy. Doing very well. She'll follow up again in 6 weeks for additional breast exam.     Clayburn Pert, MD [facility] General Surgeon  11/11/2016,9:54 AM

## 2016-11-11 NOTE — Patient Instructions (Signed)
We will have you follow-up in our office in 6 weeks with Dr. Adonis Huguenin. See appointment below.

## 2016-11-12 ENCOUNTER — Ambulatory Visit: Payer: Medicare Other

## 2016-11-12 ENCOUNTER — Ambulatory Visit
Admission: RE | Admit: 2016-11-12 | Discharge: 2016-11-12 | Disposition: A | Discharge status: Home / Self Care | Payer: Medicare Other | Source: Ambulatory Visit | Attending: Radiation Oncology | Admitting: Radiation Oncology

## 2016-11-12 DIAGNOSIS — C50312 Malignant neoplasm of lower-inner quadrant of left female breast: Secondary | ICD-10-CM | POA: Diagnosis not present

## 2016-11-13 ENCOUNTER — Ambulatory Visit
Admission: RE | Admit: 2016-11-13 | Discharge: 2016-11-13 | Disposition: A | Discharge status: Home / Self Care | Payer: Medicare Other | Source: Ambulatory Visit | Attending: Radiation Oncology | Admitting: Radiation Oncology

## 2016-11-13 ENCOUNTER — Ambulatory Visit: Payer: Medicare Other

## 2016-11-13 DIAGNOSIS — C50312 Malignant neoplasm of lower-inner quadrant of left female breast: Secondary | ICD-10-CM | POA: Diagnosis not present

## 2016-11-14 ENCOUNTER — Ambulatory Visit
Admission: RE | Admit: 2016-11-14 | Discharge: 2016-11-14 | Disposition: A | Discharge status: Home / Self Care | Payer: Medicare Other | Source: Ambulatory Visit | Attending: Radiation Oncology | Admitting: Radiation Oncology

## 2016-11-14 ENCOUNTER — Ambulatory Visit: Payer: Medicare Other

## 2016-11-14 ENCOUNTER — Inpatient Hospital Stay: Payer: Medicare Other | Attending: Radiation Oncology

## 2016-11-14 DIAGNOSIS — Z170 Estrogen receptor positive status [ER+]: Secondary | ICD-10-CM | POA: Diagnosis not present

## 2016-11-14 DIAGNOSIS — C50212 Malignant neoplasm of upper-inner quadrant of left female breast: Secondary | ICD-10-CM | POA: Diagnosis not present

## 2016-11-14 DIAGNOSIS — C50312 Malignant neoplasm of lower-inner quadrant of left female breast: Secondary | ICD-10-CM | POA: Diagnosis not present

## 2016-11-14 LAB — CBC
HEMATOCRIT: 34.2 % — AB (ref 35.0–47.0)
Hemoglobin: 11.4 g/dL — ABNORMAL LOW (ref 12.0–16.0)
MCH: 30.4 pg (ref 26.0–34.0)
MCHC: 33.3 g/dL (ref 32.0–36.0)
MCV: 91.3 fL (ref 80.0–100.0)
Platelets: 201 10*3/uL (ref 150–440)
RBC: 3.75 MIL/uL — ABNORMAL LOW (ref 3.80–5.20)
RDW: 14.9 % — AB (ref 11.5–14.5)
WBC: 6 10*3/uL (ref 3.6–11.0)

## 2016-11-15 ENCOUNTER — Ambulatory Visit
Admission: RE | Admit: 2016-11-15 | Discharge: 2016-11-15 | Disposition: A | Discharge status: Home / Self Care | Payer: Medicare Other | Source: Ambulatory Visit | Attending: Radiation Oncology | Admitting: Radiation Oncology

## 2016-11-15 ENCOUNTER — Ambulatory Visit: Payer: Medicare Other

## 2016-11-15 DIAGNOSIS — C50312 Malignant neoplasm of lower-inner quadrant of left female breast: Secondary | ICD-10-CM | POA: Diagnosis not present

## 2016-11-18 ENCOUNTER — Ambulatory Visit
Admission: RE | Admit: 2016-11-18 | Discharge: 2016-11-18 | Disposition: A | Discharge status: Home / Self Care | Payer: Medicare Other | Source: Ambulatory Visit | Attending: Radiation Oncology | Admitting: Radiation Oncology

## 2016-11-18 ENCOUNTER — Ambulatory Visit: Payer: Medicare Other

## 2016-11-18 DIAGNOSIS — C50312 Malignant neoplasm of lower-inner quadrant of left female breast: Secondary | ICD-10-CM | POA: Diagnosis not present

## 2016-11-19 ENCOUNTER — Ambulatory Visit: Payer: Medicare Other

## 2016-11-19 ENCOUNTER — Ambulatory Visit
Admission: RE | Admit: 2016-11-19 | Discharge: 2016-11-19 | Disposition: A | Discharge status: Home / Self Care | Payer: Medicare Other | Source: Ambulatory Visit | Attending: Radiation Oncology | Admitting: Radiation Oncology

## 2016-11-19 DIAGNOSIS — C50312 Malignant neoplasm of lower-inner quadrant of left female breast: Secondary | ICD-10-CM | POA: Diagnosis not present

## 2016-11-20 ENCOUNTER — Ambulatory Visit: Payer: Medicare Other

## 2016-11-21 ENCOUNTER — Ambulatory Visit: Payer: Medicare Other

## 2016-11-22 ENCOUNTER — Ambulatory Visit
Admission: RE | Admit: 2016-11-22 | Discharge: 2016-11-22 | Disposition: A | Discharge status: Home / Self Care | Payer: Medicare Other | Source: Ambulatory Visit | Attending: Radiation Oncology | Admitting: Radiation Oncology

## 2016-11-22 ENCOUNTER — Ambulatory Visit: Payer: Medicare Other

## 2016-11-22 DIAGNOSIS — C50312 Malignant neoplasm of lower-inner quadrant of left female breast: Secondary | ICD-10-CM | POA: Diagnosis not present

## 2016-11-25 ENCOUNTER — Ambulatory Visit: Payer: Medicare Other

## 2016-11-25 ENCOUNTER — Ambulatory Visit
Admission: RE | Admit: 2016-11-25 | Discharge: 2016-11-25 | Disposition: A | Discharge status: Home / Self Care | Payer: Medicare Other | Source: Ambulatory Visit | Attending: Radiation Oncology | Admitting: Radiation Oncology

## 2016-11-25 DIAGNOSIS — C50312 Malignant neoplasm of lower-inner quadrant of left female breast: Secondary | ICD-10-CM | POA: Diagnosis not present

## 2016-11-26 ENCOUNTER — Ambulatory Visit: Payer: Medicare Other

## 2016-11-26 ENCOUNTER — Ambulatory Visit
Admission: RE | Admit: 2016-11-26 | Discharge: 2016-11-26 | Disposition: A | Discharge status: Home / Self Care | Payer: Medicare Other | Source: Ambulatory Visit | Attending: Radiation Oncology | Admitting: Radiation Oncology

## 2016-11-26 DIAGNOSIS — C50312 Malignant neoplasm of lower-inner quadrant of left female breast: Secondary | ICD-10-CM | POA: Diagnosis not present

## 2016-11-27 ENCOUNTER — Ambulatory Visit: Payer: Medicare Other

## 2016-11-27 ENCOUNTER — Ambulatory Visit
Admission: RE | Admit: 2016-11-27 | Discharge: 2016-11-27 | Disposition: A | Discharge status: Home / Self Care | Payer: Medicare Other | Source: Ambulatory Visit | Attending: Radiation Oncology | Admitting: Radiation Oncology

## 2016-11-27 DIAGNOSIS — C50312 Malignant neoplasm of lower-inner quadrant of left female breast: Secondary | ICD-10-CM | POA: Diagnosis not present

## 2016-11-28 ENCOUNTER — Ambulatory Visit
Admission: RE | Admit: 2016-11-28 | Discharge: 2016-11-28 | Disposition: A | Discharge status: Home / Self Care | Payer: Medicare Other | Source: Ambulatory Visit | Attending: Radiation Oncology | Admitting: Radiation Oncology

## 2016-11-28 ENCOUNTER — Inpatient Hospital Stay: Payer: Medicare Other

## 2016-11-28 ENCOUNTER — Ambulatory Visit: Payer: Medicare Other

## 2016-11-28 DIAGNOSIS — C50212 Malignant neoplasm of upper-inner quadrant of left female breast: Secondary | ICD-10-CM

## 2016-11-28 DIAGNOSIS — Z170 Estrogen receptor positive status [ER+]: Principal | ICD-10-CM

## 2016-11-28 DIAGNOSIS — C50312 Malignant neoplasm of lower-inner quadrant of left female breast: Secondary | ICD-10-CM | POA: Diagnosis not present

## 2016-11-28 LAB — CBC
HCT: 35.1 % (ref 35.0–47.0)
Hemoglobin: 11.5 g/dL — ABNORMAL LOW (ref 12.0–16.0)
MCH: 30.1 pg (ref 26.0–34.0)
MCHC: 32.8 g/dL (ref 32.0–36.0)
MCV: 91.9 fL (ref 80.0–100.0)
PLATELETS: 190 10*3/uL (ref 150–440)
RBC: 3.82 MIL/uL (ref 3.80–5.20)
RDW: 14.8 % — ABNORMAL HIGH (ref 11.5–14.5)
WBC: 5.5 10*3/uL (ref 3.6–11.0)

## 2016-11-29 ENCOUNTER — Ambulatory Visit: Payer: Medicare Other

## 2016-12-02 ENCOUNTER — Ambulatory Visit: Payer: Medicare Other

## 2016-12-03 ENCOUNTER — Ambulatory Visit: Payer: Medicare Other

## 2016-12-04 ENCOUNTER — Ambulatory Visit: Payer: Medicare Other

## 2016-12-05 ENCOUNTER — Ambulatory Visit: Payer: Medicare Other

## 2016-12-06 ENCOUNTER — Ambulatory Visit: Payer: Medicare Other

## 2016-12-06 ENCOUNTER — Other Ambulatory Visit: Payer: Self-pay | Admitting: *Deleted

## 2016-12-06 DIAGNOSIS — Z170 Estrogen receptor positive status [ER+]: Principal | ICD-10-CM

## 2016-12-06 DIAGNOSIS — C50212 Malignant neoplasm of upper-inner quadrant of left female breast: Secondary | ICD-10-CM

## 2016-12-11 ENCOUNTER — Inpatient Hospital Stay: Payer: Medicare Other

## 2016-12-11 ENCOUNTER — Encounter: Payer: Self-pay | Admitting: Hematology and Oncology

## 2016-12-11 ENCOUNTER — Inpatient Hospital Stay: Payer: Medicare Other | Attending: Hematology and Oncology | Admitting: Hematology and Oncology

## 2016-12-11 VITALS — BP 138/69 | HR 58 | Temp 96.4°F | Resp 18 | Wt 169.3 lb

## 2016-12-11 DIAGNOSIS — Z809 Family history of malignant neoplasm, unspecified: Secondary | ICD-10-CM | POA: Diagnosis not present

## 2016-12-11 DIAGNOSIS — Z87891 Personal history of nicotine dependence: Secondary | ICD-10-CM | POA: Insufficient documentation

## 2016-12-11 DIAGNOSIS — Z7982 Long term (current) use of aspirin: Secondary | ICD-10-CM | POA: Diagnosis not present

## 2016-12-11 DIAGNOSIS — D472 Monoclonal gammopathy: Secondary | ICD-10-CM | POA: Insufficient documentation

## 2016-12-11 DIAGNOSIS — Z170 Estrogen receptor positive status [ER+]: Secondary | ICD-10-CM | POA: Diagnosis not present

## 2016-12-11 DIAGNOSIS — Z79899 Other long term (current) drug therapy: Secondary | ICD-10-CM | POA: Insufficient documentation

## 2016-12-11 DIAGNOSIS — I10 Essential (primary) hypertension: Secondary | ICD-10-CM | POA: Insufficient documentation

## 2016-12-11 DIAGNOSIS — K219 Gastro-esophageal reflux disease without esophagitis: Secondary | ICD-10-CM | POA: Diagnosis not present

## 2016-12-11 DIAGNOSIS — E7800 Pure hypercholesterolemia, unspecified: Secondary | ICD-10-CM | POA: Insufficient documentation

## 2016-12-11 DIAGNOSIS — Z923 Personal history of irradiation: Secondary | ICD-10-CM | POA: Insufficient documentation

## 2016-12-11 DIAGNOSIS — L299 Pruritus, unspecified: Secondary | ICD-10-CM | POA: Diagnosis not present

## 2016-12-11 DIAGNOSIS — Z79811 Long term (current) use of aromatase inhibitors: Secondary | ICD-10-CM | POA: Insufficient documentation

## 2016-12-11 DIAGNOSIS — C50212 Malignant neoplasm of upper-inner quadrant of left female breast: Secondary | ICD-10-CM | POA: Insufficient documentation

## 2016-12-11 DIAGNOSIS — C50412 Malignant neoplasm of upper-outer quadrant of left female breast: Secondary | ICD-10-CM | POA: Diagnosis present

## 2016-12-11 LAB — CBC WITH DIFFERENTIAL/PLATELET
Basophils Absolute: 0 10*3/uL (ref 0–0.1)
Basophils Relative: 0 %
Eosinophils Absolute: 0 10*3/uL (ref 0–0.7)
Eosinophils Relative: 0 %
HCT: 36.6 % (ref 35.0–47.0)
Hemoglobin: 12.1 g/dL (ref 12.0–16.0)
Lymphocytes Relative: 19 %
Lymphs Abs: 1.1 10*3/uL (ref 1.0–3.6)
MCH: 30.4 pg (ref 26.0–34.0)
MCHC: 33 g/dL (ref 32.0–36.0)
MCV: 92.2 fL (ref 80.0–100.0)
Monocytes Absolute: 0.5 10*3/uL (ref 0.2–0.9)
Monocytes Relative: 8 %
Neutro Abs: 4.2 10*3/uL (ref 1.4–6.5)
Neutrophils Relative %: 73 %
Platelets: 193 10*3/uL (ref 150–440)
RBC: 3.96 MIL/uL (ref 3.80–5.20)
RDW: 14.3 % (ref 11.5–14.5)
WBC: 5.8 10*3/uL (ref 3.6–11.0)

## 2016-12-11 LAB — COMPREHENSIVE METABOLIC PANEL
ALT: 12 U/L — ABNORMAL LOW (ref 14–54)
AST: 15 U/L (ref 15–41)
Albumin: 3.6 g/dL (ref 3.5–5.0)
Alkaline Phosphatase: 93 U/L (ref 38–126)
Anion gap: 5 (ref 5–15)
BUN: 14 mg/dL (ref 6–20)
CO2: 27 mmol/L (ref 22–32)
Calcium: 8.9 mg/dL (ref 8.9–10.3)
Chloride: 104 mmol/L (ref 101–111)
Creatinine, Ser: 1.33 mg/dL — ABNORMAL HIGH (ref 0.44–1.00)
GFR calc Af Amer: 43 mL/min — ABNORMAL LOW (ref >60)
GFR calc non Af Amer: 37 mL/min — ABNORMAL LOW (ref >60)
Glucose, Bld: 110 mg/dL — ABNORMAL HIGH (ref 65–99)
Potassium: 3.8 mmol/L (ref 3.5–5.1)
Sodium: 136 mmol/L (ref 135–145)
Total Bilirubin: 0.3 mg/dL (ref 0.3–1.2)
Total Protein: 7.9 g/dL (ref 6.5–8.1)

## 2016-12-11 MED ORDER — LETROZOLE 2.5 MG PO TABS: 2.5000 mg | ORAL_TABLET | Freq: Every day | ORAL | 2 refills | Status: DC

## 2016-12-11 NOTE — Progress Notes (Signed)
Patient states she continues to have intermittent shooting pain in her left axilla.  Wants to know about how long she will have this pain.  States she also itches in that area as well.

## 2016-12-11 NOTE — Progress Notes (Signed)
[place] Clinic day:  12/11/2016  Chief Complaint: Erica Jackson is a 83 y.o. female with stage I left breast cancer who is seen for for assessment following completion of radiation.  HPI:  The patient was last seen in the medical oncology clinic on 10/09/2016.  At that time, she was seen following lumpectomy and sentinel lymph node biopsy.  Pathology revealed a 1.0 cm grade II invasive mammary carcinoma of no special type.  Margins were negative.  There was no tumor in 1 sentinel lymph node.  Pathologic stage was T1bN0M0.  We discussed plans for radiation followed by hormonal therapy.  She received radiation from 11/01/2016 - 11/28/2016.  Symptomatically, she is feeling well.  She denies joint pain or hot flashes. She admits to occasional "itching" in her left axillae.   Past Medical History:  Diagnosis Date  . Breast cancer ([place])    Lower inner quadrant left breast  . Cataract   . Dysrhythmia    no problems in a long time  . GERD (gastroesophageal reflux disease)   . Headache   . Hypercholesterolemia   . Hypertension     Past Surgical History:  Procedure Laterality Date  . ABDOMINAL HYSTERECTOMY    . BREAST BIOPSY Left 08/28/2016   path pending  . BREAST LUMPECTOMY WITH SENTINEL LYMPH NODE BIOPSY Left 09/17/2016   Procedure: BREAST LUMPECTOMY WITH SENTINEL LYMPH NODE BX;  Surgeon: Clayburn Pert, MD;  Location: [facility] ORS;  Service: General;  Laterality: Left;  . EYE SURGERY Left 2015    Family History  Problem Relation Age of Onset  . Stroke Mother   . Cancer Sister   . Cancer Brother   . Cancer Daughter   . Cancer Brother   . Cancer Brother   . Cancer Brother   . Cancer Brother   . Cancer Brother   . Breast cancer Neg Hx    The patient notes longevity in her family.  Her aunt lived to 85.  Her great grandmother lived to 50.  Social History:  reports that she quit smoking about 27 years ago. She smoked 1.00 pack per day.  She quit smokeless tobacco use about 27 years ago. She reports that she does not drink alcohol or use drugs.  The patient previously smoked 1 pack/week for 5-10 years.  She stopped smoking in 1990.  The patient is alone today.   Allergies: No Known Allergies  Current Medications: Current Outpatient Prescriptions  Medication Sig Dispense Refill  . aspirin EC 81 MG tablet Take by mouth.    Marland Kitchen atenolol (TENORMIN) 25 MG tablet TAKE ONE TABLET BY MOUTH ONCE DAILY    . docusate sodium (COLACE) 100 MG capsule Take 1 capsule (100 mg total) by mouth 2 (two) times daily. 10 capsule 0  . felodipine (PLENDIL) 5 MG 24 hr tablet Take by mouth.    Marland Kitchen HYDROcodone-acetaminophen (NORCO) 5-325 MG tablet Take 1 tablet by mouth every 6 (six) hours as needed for moderate pain. 30 tablet 0  . isosorbide dinitrate (ISORDIL) 20 MG tablet Take by mouth.    . lovastatin (MEVACOR) 10 MG tablet TAKE ONE TABLET BY MOUTH ONCE DAILY WITH DINNER    . omeprazole (PRILOSEC) 20 MG capsule Take by mouth.    . potassium chloride SA (K-DUR,KLOR-CON) 20 MEQ tablet Take by mouth.    . letrozole (FEMARA) 2.5 MG tablet Take 1 tablet (2.5 mg total) by mouth daily. 30 tablet 2   No current  facility-administered medications for this visit.     Review of Systems:  GENERAL:  Feels good.  No fevers or sweats.  Weight gain of 1 pound. PERFORMANCE STATUS (ECOG):  1 HEENT:  No visual changes, runny nose, sore throat, mouth sores or tenderness. Lungs: No shortness of breath or cough.  No hemoptysis. Cardiac:  No chest pain, palpitations, orthopnea, or PND. GI:  Constipation.  No nausea, vomiting, diarrhea, constipation, melena or hematochezia.  Colonoscopy < 10 years ago.   GU:  No urgency, frequency, dysuria, or hematuria. Musculoskeletal:  No back pain.  No joint pain.  No muscle tenderness. Extremities:  No pain or swelling in left arm. Skin:  Itching in left axilla. No rashes or skin changes. Neuro:  Headache once in awhile.  No  numbness or weakness, balance or coordination issues. Endocrine:  No diabetes, thyroid issues, hot flashes or night sweats. Psych:  No mood changes, depression or anxiety. Pain:  No focal pain.  Review of systems:  All other systems reviewed and found to be negative.  Physical Exam: Blood pressure 138/69, pulse (!) 58, temperature (!) 96.4 F (35.8 C), temperature source Tympanic, resp. rate 18, weight 169 lb 5 oz (76.8 kg). GENERAL:  Well developed, well nourished, elderly woman sitting comfortably in the exam room in no acute distress. MENTAL STATUS:  Alert and oriented to person, place and time. HEAD:  Pearline Cables hair.  Normocephalic, atraumatic, face symmetric, no Cushingoid features. EYES:  Brown eyes.  Amblyopia.  Pupils equal round and reactive to light and accomodation.  No conjunctivitis or scleral icterus. ENT:  Oropharynx clear without lesion.  Tongue normal.  Upper and lower dentures.  Mucous membranes moist.  RESPIRATORY:  Clear to auscultation without rales, wheezes or rhonchi. CARDIOVASCULAR:  Regular rate and rhythm without murmur, rub or gallop. BREAST:  Fibrocystic changes bilaterally.  Right breast without masses, skin changes or nipple discharge.  Left breast with mild radiation changes (edema) without masses, skin changes or nipple discharge.  ABDOMEN:  Soft, non-tender, with active bowel sounds, and no hepatosplenomegaly.  No masses. SKIN:  Hyperpigmentation in left axillae.  No rashes, ulcers or lesions. EXTREMITIES: No edema, skin discoloration or tenderness.  No palpable cords. LYMPH NODES: No palpable cervical, supraclavicular, axillary or inguinal adenopathy  NEUROLOGICAL: Unremarkable. PSYCH:  Appropriate.   Appointment on 12/11/2016  Component Date Value Ref Range Status  . WBC 12/11/2016 5.8  3.6 - 11.0 K/uL Final  . RBC 12/11/2016 3.96  3.80 - 5.20 MIL/uL Final  . Hemoglobin 12/11/2016 12.1  12.0 - 16.0 g/dL Final  . HCT 12/11/2016 36.6  35.0 - 47.0 % Final  .  MCV 12/11/2016 92.2  80.0 - 100.0 fL Final  . MCH 12/11/2016 30.4  26.0 - 34.0 pg Final  . MCHC 12/11/2016 33.0  32.0 - 36.0 g/dL Final  . RDW 12/11/2016 14.3  11.5 - 14.5 % Final  . Platelets 12/11/2016 193  150 - 440 K/uL Final  . Neutrophils Relative % 12/11/2016 73  % Final  . Neutro Abs 12/11/2016 4.2  1.4 - 6.5 K/uL Final  . Lymphocytes Relative 12/11/2016 19  % Final  . Lymphs Abs 12/11/2016 1.1  1.0 - 3.6 K/uL Final  . Monocytes Relative 12/11/2016 8  % Final  . Monocytes Absolute 12/11/2016 0.5  0.2 - 0.9 K/uL Final  . Eosinophils Relative 12/11/2016 0  % Final  . Eosinophils Absolute 12/11/2016 0.0  0 - 0.7 K/uL Final  . Basophils Relative 12/11/2016 0  %  Final  . Basophils Absolute 12/11/2016 0.0  0 - 0.1 K/uL Final  . Sodium 12/11/2016 136  135 - 145 mmol/L Final  . Potassium 12/11/2016 3.8  3.5 - 5.1 mmol/L Final  . Chloride 12/11/2016 104  101 - 111 mmol/L Final  . CO2 12/11/2016 27  22 - 32 mmol/L Final  . Glucose, Bld 12/11/2016 110* 65 - 99 mg/dL Final  . BUN 12/11/2016 14  6 - 20 mg/dL Final  . Creatinine, Ser 12/11/2016 1.33* 0.44 - 1.00 mg/dL Final  . Calcium 12/11/2016 8.9  8.9 - 10.3 mg/dL Final  . Total Protein 12/11/2016 7.9  6.5 - 8.1 g/dL Final  . Albumin 12/11/2016 3.6  3.5 - 5.0 g/dL Final  . AST 12/11/2016 15  15 - 41 U/L Final  . ALT 12/11/2016 12* 14 - 54 U/L Final  . Alkaline Phosphatase 12/11/2016 93  38 - 126 U/L Final  . Total Bilirubin 12/11/2016 0.3  0.3 - 1.2 mg/dL Final  . GFR calc non Af Amer 12/11/2016 37* >60 mL/min Final  . GFR calc Af Amer 12/11/2016 43* >60 mL/min Final   Comment: (NOTE) The eGFR has been calculated using the CKD EPI equation. This calculation has not been validated in all clinical situations. eGFR's persistently <60 mL/min signify possible Chronic Kidney Disease.   . Anion gap 12/11/2016 5  5 - 15 Final    Assessment:  Erica Jackson is a 83 y.o. female with stage I left breast cancer s/p lumpectomy and sentinel  lymph node biopsy on 09/17/2016.  Pathology revealed a 1.0 cm grade II invasive mammary carcinoma of no special type.  Margins were negative.  There was no tumor in 1 sentinel lymph node.  Pathologic stage was T1bN0M0.  Mammogram and ultrasound on 08/16/2016 revealed a 1.1 x 0.6 x 0.8 cm mass at the 10 o'clock position.  Left breast biopsy on 08/28/2016 revealed a 0.9 cm grade II invasive mammary carcinoma.  Tumor was ER positive (> 90%), PR negative, and Her2 2+ (negative by FISH).  CA27.29 was 21.7 on 09/11/2016.  She completed radiation on 11/28/2016.  Bone density study on 09/25/2016 was normal with a T score of 0.4 in the left femoral neck.   Symptomatically, she notes left axillary pruritus.  Exam is unremarkable.  Plan: 1.  Labs today:  CBC with diff, CMP, SPEP. 2.  Discuss plans for 5 years of hormonal therapy.  Discuss initiation of Femara.  Side effects reviewed. 3.  Rx:  Femara 2.5 mg po q day (dis: #30 with 2 refills). 4.  Obtain radiation summary. 5.  RTC in 1 month for MD assessment and labs (LFTs).  The patient was seen and examined.  The assessment and plan was discussed with the patient.  Multiple questions were asked and answered.   Faythe Casa, NP Nolon Stalls, MD 12/11/2016, 11:55 AM

## 2016-12-12 LAB — PROTEIN ELECTROPHORESIS, SERUM
A/G Ratio: 0.9 (ref 0.7–1.7)
Albumin ELP: 3.6 g/dL (ref 2.9–4.4)
Alpha-1-Globulin: 0.2 g/dL (ref 0.0–0.4)
Alpha-2-Globulin: 0.7 g/dL (ref 0.4–1.0)
Beta Globulin: 1.2 g/dL (ref 0.7–1.3)
Gamma Globulin: 1.9 g/dL — ABNORMAL HIGH (ref 0.4–1.8)
Globulin, Total: 3.9 g/dL (ref 2.2–3.9)
M-Spike, %: 0.4 g/dL — ABNORMAL HIGH
Total Protein ELP: 7.5 g/dL (ref 6.0–8.5)

## 2016-12-13 ENCOUNTER — Telehealth: Payer: Self-pay | Admitting: *Deleted

## 2016-12-13 NOTE — Telephone Encounter (Signed)
Called daughter and notified her of lab results and need for additional testing and appt. Voiced understanding.

## 2016-12-18 ENCOUNTER — Inpatient Hospital Stay: Payer: Medicare Other

## 2016-12-18 ENCOUNTER — Inpatient Hospital Stay (HOSPITAL_BASED_OUTPATIENT_CLINIC_OR_DEPARTMENT_OTHER): Payer: Medicare Other | Admitting: Hematology and Oncology

## 2016-12-18 ENCOUNTER — Encounter: Payer: Self-pay | Admitting: Hematology and Oncology

## 2016-12-18 DIAGNOSIS — C50212 Malignant neoplasm of upper-inner quadrant of left female breast: Secondary | ICD-10-CM | POA: Diagnosis not present

## 2016-12-18 DIAGNOSIS — D472 Monoclonal gammopathy: Secondary | ICD-10-CM | POA: Diagnosis not present

## 2016-12-18 DIAGNOSIS — Z809 Family history of malignant neoplasm, unspecified: Secondary | ICD-10-CM

## 2016-12-18 DIAGNOSIS — Z79811 Long term (current) use of aromatase inhibitors: Secondary | ICD-10-CM

## 2016-12-18 DIAGNOSIS — Z170 Estrogen receptor positive status [ER+]: Secondary | ICD-10-CM | POA: Diagnosis not present

## 2016-12-18 DIAGNOSIS — L299 Pruritus, unspecified: Secondary | ICD-10-CM

## 2016-12-18 DIAGNOSIS — I10 Essential (primary) hypertension: Secondary | ICD-10-CM

## 2016-12-18 DIAGNOSIS — E7800 Pure hypercholesterolemia, unspecified: Secondary | ICD-10-CM

## 2016-12-18 DIAGNOSIS — Z87891 Personal history of nicotine dependence: Secondary | ICD-10-CM

## 2016-12-18 DIAGNOSIS — Z7982 Long term (current) use of aspirin: Secondary | ICD-10-CM

## 2016-12-18 DIAGNOSIS — Z923 Personal history of irradiation: Secondary | ICD-10-CM

## 2016-12-18 DIAGNOSIS — K219 Gastro-esophageal reflux disease without esophagitis: Secondary | ICD-10-CM

## 2016-12-18 DIAGNOSIS — Z79899 Other long term (current) drug therapy: Secondary | ICD-10-CM

## 2016-12-18 LAB — HEPATIC FUNCTION PANEL
ALT: 14 U/L (ref 14–54)
AST: 15 U/L (ref 15–41)
Albumin: 3.9 g/dL (ref 3.5–5.0)
Alkaline Phosphatase: 94 U/L (ref 38–126)
Bilirubin, Direct: <0.1 mg/dL — ABNORMAL LOW (ref 0.1–0.5)
Total Bilirubin: 0.4 mg/dL (ref 0.3–1.2)
Total Protein: 8.3 g/dL — ABNORMAL HIGH (ref 6.5–8.1)

## 2016-12-18 NOTE — Progress Notes (Signed)
[facility] day:  12/18/2016  Chief Complaint: Erica Jackson is a 83 y.o. female with stage I left breast cancer who is seen for assessment of a monoclonal gammopathy.  HPI:  The patient was last seen in the medical oncology clinic on 12/11/2016.  At that time, she had completed radiation.  She began Femara.   Previously, she was noted to have an elevated protein with a normal albumen and an elevated creatinine.  SPEP on 12/11/2016 revealed a 0.4 gm/dL monoclonal protein.  Symptomatically, she denies any bone pain or issues with infections.  She notes that her sister has multiple myeloma.   Past Medical History:  Diagnosis Date  . Breast cancer ([place])    Lower inner quadrant left breast  . Cataract   . Dysrhythmia    no problems in a long time  . GERD (gastroesophageal reflux disease)   . Headache   . Hypercholesterolemia   . Hypertension     Past Surgical History:  Procedure Laterality Date  . ABDOMINAL HYSTERECTOMY    . BREAST BIOPSY Left 08/28/2016   path pending  . BREAST LUMPECTOMY WITH SENTINEL LYMPH NODE BIOPSY Left 09/17/2016   Procedure: BREAST LUMPECTOMY WITH SENTINEL LYMPH NODE BX;  Surgeon: Clayburn Pert, MD;  Location: [facility] ORS;  Service: General;  Laterality: Left;  . EYE SURGERY Left 2015    Family History  Problem Relation Age of Onset  . Stroke Mother   . Cancer Sister   . Cancer Brother   . Cancer Daughter   . Cancer Brother   . Cancer Brother   . Cancer Brother   . Cancer Brother   . Cancer Brother   . Breast cancer Neg Hx    The patient notes longevity in her family.  Her aunt lived to 30.  Her great grandmother lived to 42.  Her sister has had multiple myeloma x 10 years.  Social History:  reports that she quit smoking about 27 years ago. She smoked 1.00 pack per day. She quit smokeless tobacco use about 27 years ago. She reports that she does not drink alcohol or use drugs.  The patient previously  smoked 1 pack/week for 5-10 years.  She stopped smoking in 1990.  The patient is accompanied by her daughter today.   Allergies: No Known Allergies  Current Medications: Current Outpatient Prescriptions  Medication Sig Dispense Refill  . aspirin EC 81 MG tablet Take 81 mg by mouth daily.     Marland Kitchen atenolol (TENORMIN) 25 MG tablet TAKE ONE TABLET BY MOUTH ONCE DAILY    . docusate sodium (COLACE) 100 MG capsule Take 1 capsule (100 mg total) by mouth 2 (two) times daily. 10 capsule 0  . felodipine (PLENDIL) 5 MG 24 hr tablet Take 5 mg by mouth daily.     . isosorbide dinitrate (ISORDIL) 20 MG tablet Take 20 mg by mouth 2 (two) times daily.     Marland Kitchen letrozole (FEMARA) 2.5 MG tablet Take 1 tablet (2.5 mg total) by mouth daily. 30 tablet 2  . lovastatin (MEVACOR) 10 MG tablet TAKE ONE TABLET BY MOUTH ONCE DAILY WITH DINNER    . omeprazole (PRILOSEC) 20 MG capsule Take 20 mg by mouth daily.     . potassium chloride SA (K-DUR,KLOR-CON) 20 MEQ tablet Take 20 mEq by mouth daily.      No current facility-administered medications for this visit.     Review of Systems:  GENERAL:  Feels good.  No fevers or sweats.  Weight gain of 1 pound. PERFORMANCE STATUS (ECOG):  1 HEENT:  No visual changes, runny nose, sore throat, mouth sores or tenderness. Lungs: No shortness of breath or cough.  No hemoptysis. Cardiac:  No chest pain, palpitations, orthopnea, or PND. GI:  Constipation.  No nausea, vomiting, diarrhea, constipation, melena or hematochezia.  Colonoscopy < 10 years ago.   GU:  No urgency, frequency, dysuria, or hematuria. Musculoskeletal:  No back pain.  No joint pain.  No muscle tenderness. Extremities:  No pain or swelling in left arm. Skin:  Itching in left axilla. No rashes or skin changes. Neuro:  Headache once in awhile.  No numbness or weakness, balance or coordination issues. Endocrine:  No diabetes, thyroid issues, hot flashes or night sweats. Psych:  No mood changes, depression or  anxiety. Pain:  No focal pain.  Review of systems:  All other systems reviewed and found to be negative.  Physical Exam: Blood pressure (!) 147/70, pulse 60, temperature 97.5 F (36.4 C), temperature source Tympanic, resp. rate 18, weight 169 lb 15.6 oz (77.1 kg). GENERAL:  Well developed, well nourished, elderly woman sitting comfortably in the exam room in no acute distress. MENTAL STATUS:  Alert and oriented to person, place and time. HEAD:  Pearline Cables hair.  Normocephalic, atraumatic, face symmetric, no Cushingoid features. EYES:  Brown eyes.  Amblyopia.  No conjunctivitis or scleral icterus.  NEUROLOGICAL: Unremarkable. PSYCH:  Appropriate.   Office Visit on 12/18/2016  Component Date Value Ref Range Status  . Total Protein 12/18/2016 8.3* 6.5 - 8.1 g/dL Final  . Albumin 12/18/2016 3.9  3.5 - 5.0 g/dL Final  . AST 12/18/2016 15  15 - 41 U/L Final  . ALT 12/18/2016 14  14 - 54 U/L Final  . Alkaline Phosphatase 12/18/2016 94  38 - 126 U/L Final  . Total Bilirubin 12/18/2016 0.4  0.3 - 1.2 mg/dL Final  . Bilirubin, Direct 12/18/2016 <0.1* 0.1 - 0.5 mg/dL Final  . Indirect Bilirubin 12/18/2016 NOT CALCULATED  0.3 - 0.9 mg/dL Final  . Total Protein ELP 12/18/2016 7.9  6.0 - 8.5 g/dL Final  . IgG (Immunoglobin G), Serum 12/18/2016 2081* 700 - 1,600 mg/dL Final  . IgA 12/18/2016 227  64 - 422 mg/dL Final  . IgM, Serum 12/18/2016 43  26 - 217 mg/dL Final   Comment: (NOTE) Performed At: [facility] [place], [place] [id number] Lindon Romp MD [id number]   . Immunofixation Result, Serum 12/18/2016 Comment   Corrected   Comment: (NOTE) Immunofixation shows IgG monoclonal protein with kappa light chain specificity.   . IgG (Immunoglobin G), Serum 12/18/2016 2041* 700 - 1,600 mg/dL Final  . IgA 12/18/2016 225  64 - 422 mg/dL Final  . IgM, Serum 12/18/2016 42  26 - 217 mg/dL Final   Comment: (NOTE) Performed At: [facility] [place], [place] [id number] Lindon Romp MD [id number]   . Kappa free light chain 12/18/2016 45.7* 3.3 - 19.4 mg/L Final  . Lamda free light chains 12/18/2016 35.8* 5.7 - 26.3 mg/L Final  . Kappa, lamda light chain ratio 12/18/2016 1.28  0.26 - 1.65 Final   Comment: (NOTE) Performed At: [facility] [place], [place] [id number] Lindon Romp MD [id number]   . Beta-2 Microglobulin 12/18/2016 2.4  0.6 - 2.4 mg/L Final   Comment: (NOTE) Siemens Immulite 2000 Immunochemiluminometric assay [facility]) Performed At: [facility] [address] [place], [place] [id number]  Lindon Romp MD [id number]     Assessment:  Erica Jackson is a 83 y.o. female with stage I left breast cancer s/p lumpectomy and sentinel lymph node biopsy on 09/17/2016.  Pathology revealed a 1.0 cm grade II invasive mammary carcinoma of no special type.  Margins were negative.  There was no tumor in 1 sentinel lymph node.  Pathologic stage was T1bN0M0.  Mammogram and ultrasound on 08/16/2016 revealed a 1.1 x 0.6 x 0.8 cm mass at the 10 o'clock position.  Left breast biopsy on 08/28/2016 revealed a 0.9 cm grade II invasive mammary carcinoma.  Tumor was ER positive (> 90%), PR negative, and Her2 2+ (negative by FISH).  CA27.29 was 21.7 on 09/11/2016.  She completed radiation on 11/28/2016.  She began Femara on 12/11/2016.  Bone density study on 09/25/2016 was normal with a T score of 0.4 in the left femoral neck.   She has a history of an elevated protein (8.3 on 09/11/2016) with a normal albumen.  SPEP on 12/11/2016 revealed a 0.4 gm/dL monoclonal protein.  She has mild renal insufficiency (Cr 1.28; CrCl 45 ml/min).  Symptomatically, she denies any bone pain or issues with infections .  Exam is unremarkable.  Plan: 1.  Discuss results from last week.  Discuss monoclonal protein.  Discuss differential diagnosis (MGUS or lymphoproliferative disorder).  Suspect MGUS (monoclonal  gammopathy of unknown significance).  Patient noted that her sister has multiple myeloma.  Discuss risk of 1%/year of MGUS progressing to a myeloproliferative disorder.   Discuss work-up.  2.  Labs today:  IFE, immunoglobulins, FLCA, beta2-microglobulin. 3.  Collect 24 hour urine for UPEP and free light chains. 4.  Bone survey. 5.  RTC as previously scheduled for review of work-up and assessment after 1 month of Femara.   Nolon Stalls, MD 12/18/2016

## 2016-12-18 NOTE — Progress Notes (Signed)
Patient here today for results.  Accompanied by daughter today.

## 2016-12-19 ENCOUNTER — Ambulatory Visit (INDEPENDENT_AMBULATORY_CARE_PROVIDER_SITE_OTHER): Payer: Medicare Other | Admitting: General Surgery

## 2016-12-19 ENCOUNTER — Encounter: Payer: Self-pay | Admitting: General Surgery

## 2016-12-19 VITALS — BP 120/66 | HR 63 | Temp 98.2°F | Ht 65.0 in | Wt 171.4 lb

## 2016-12-19 DIAGNOSIS — Z853 Personal history of malignant neoplasm of breast: Secondary | ICD-10-CM

## 2016-12-19 LAB — IGG, IGA, IGM
IgA: 225 mg/dL (ref 64–422)
IgG (Immunoglobin G), Serum: 2041 mg/dL — ABNORMAL HIGH (ref 700–1600)
IgM, Serum: 42 mg/dL (ref 26–217)

## 2016-12-19 LAB — KAPPA/LAMBDA LIGHT CHAINS
Kappa free light chain: 45.7 mg/L — ABNORMAL HIGH (ref 3.3–19.4)
Kappa, lambda light chain ratio: 1.28 (ref 0.26–1.65)
Lambda free light chains: 35.8 mg/L — ABNORMAL HIGH (ref 5.7–26.3)

## 2016-12-19 LAB — BETA 2 MICROGLOBULIN, SERUM: Beta-2 Microglobulin: 2.4 mg/L (ref 0.6–2.4)

## 2016-12-19 NOTE — Patient Instructions (Addendum)
We will see you back in 3 months with a mammogram before. Please see appointment below. I will call you with your mammogram appointment.  Make sure that you are doing your self breast exams each month. See instructions below. If you are having any new lumps/bumps, discharge, or skin changes call our office prior to your next appointment and we will work you in with Dr. Adonis Huguenin.   Breast Self-Awareness Introduction Breast self-awareness means:  Knowing how your breasts look.  Knowing how your breasts feel.  Checking your breasts every month for changes.  Telling your doctor if you notice a change in your breasts. Breast self-awareness allows you to notice a breast problem early while it is still small. How to do a breast self-exam One way to learn what is normal for your breasts and to check for changes is to do a breast self-exam. To do a breast self-exam: Look for Changes  1. Take off all the clothes above your waist. 2. Stand in front of a mirror in a room with good lighting. 3. Put your hands on your hips. 4. Push your hands down. 5. Look at your breasts and nipples in the mirror to see if one breast or nipple looks different than the other. Check to see if:  The shape of one breast is different.  The size of one breast is different.  There are wrinkles, dips, and bumps in one breast and not the other. 6. Look at each breast for changes in your skin, such as:  Redness.  Scaly areas. 7. Look for changes in your nipples, such as:  Liquid around the nipples.  Bleeding.  Dimpling.  Redness.  A change in where the nipples are. Feel for Changes 1. Lie on your back on the floor. 2. Feel each breast. To do this, follow these steps:  Pick a breast to feel.  Put the arm closest to that breast above your head.  Use your other arm to feel the nipple area of your breast. Feel the area with the pads of your three middle fingers by making small circles with your fingers.  For the first circle, press lightly. For the second circle, press harder. For the third circle, press even harder.  Keep making circles with your fingers at the light, harder, and even harder pressures as you move down your breast. Stop when you feel your ribs.  Move your fingers a little toward the center of your body.  Start making circles with your fingers again, this time going up until you reach your collarbone.  Keep making up and down circles until you reach your armpit. Remember to keep using the three pressures.  Feel the other breast in the same way. 3. Sit or stand in the shower or tub. 4. With soapy water on your skin, feel each breast the same way you did in step 2, when you were lying on the floor. Write Down What You Find  After doing the self-exam, write down:  What is normal for each breast.  Any changes you find in each breast.  When you last had your period. How often should I check my breasts? Check your breasts every month. If you are breastfeeding, the best time to check them is after you feed your baby or after you use a breast pump. If you get periods, the best time to check your breasts is 5-7 days after your period is over. When should I see my doctor? See your doctor if you  notice:  A change in shape or size of your breasts or nipples.  A change in the skin of your breast or nipples, such as red or scaly skin.  Unusual fluid coming from your nipples.  A lump or thick area that was not there before.  Pain in your breasts.  Anything that concerns you. This information is not intended to replace advice given to you by your health care provider. Make sure you discuss any questions you have with your health care provider. Document Released: 04/08/2008 Document Revised: 03/28/2016 Document Reviewed: 09/10/2015  2017 Elsevier

## 2016-12-19 NOTE — Progress Notes (Signed)
Outpatient Surgical Follow Up  12/19/2016  Erica Jackson is an 83 y.o. female.   Chief Complaint  Patient presents with  . Follow-up    Left Breast Lumpectomy (09/17/16)- Dr. Adonis Huguenin    HPI: A 83 year old female returns to clinic 3 months status post left breast lumpectomy with sentinel lymph node biopsy. Patient reports doing very well. She has completed her radiation therapy. She only has the occasional twinge of pain to her left breast and axilla. The pains last a few seconds and then goes away. She currently denies any fevers, chills, nausea, vomiting, shortness breath, diarrhea, constipation.  Past Medical History:  Diagnosis Date  . Breast cancer ([place])    Lower inner quadrant left breast  . Cataract   . Dysrhythmia    no problems in a long time  . GERD (gastroesophageal reflux disease)   . Headache   . Hypercholesterolemia   . Hypertension     Past Surgical History:  Procedure Laterality Date  . ABDOMINAL HYSTERECTOMY    . BREAST BIOPSY Left 08/28/2016   path pending  . BREAST LUMPECTOMY WITH SENTINEL LYMPH NODE BIOPSY Left 09/17/2016   Procedure: BREAST LUMPECTOMY WITH SENTINEL LYMPH NODE BX;  Surgeon: Clayburn Pert, MD;  Location: [facility] ORS;  Service: General;  Laterality: Left;  . EYE SURGERY Left 2015    Family History  Problem Relation Age of Onset  . Stroke Mother   . Cancer Sister   . Cancer Brother   . Cancer Daughter   . Cancer Brother   . Cancer Brother   . Cancer Brother   . Cancer Brother   . Cancer Brother   . Breast cancer Neg Hx     Social History:  reports that she quit smoking about 27 years ago. She smoked 1.00 pack per day. She quit smokeless tobacco use about 27 years ago. She reports that she does not drink alcohol or use drugs.  Allergies: No Known Allergies  Medications reviewed.    ROS A multipoint review of systems was completed. All pertinent positives and negatives are documented within the history of present illness and  remainder are negative.   BP 120/66   Pulse 63   Temp 98.2 F (36.8 C) (Oral)   Ht 5\' 5"  (1.651 m)   Wt 77.7 kg (171 lb 6.4 oz)   BMI 28.52 kg/m   Physical Exam Gen.: No acute distress Neck: Supple and nontender Lymph nodes: No evidence of cervical, clavicular, axillary lymphadenopathy Breasts: Bilateral breast exams. Right breast without any palpable masses or lumps. Left breast with a well-healed lumpectomy incision site. There is a hard area of tissue below the incision site that has been present for the last several weeks without change. No evidence of any other dominant masses or lumps. Heart: Regular rhythm Chest: Clear to auscultation Abdomen: Soft and nontender    Results for orders placed or performed in visit on 12/18/16 (from the past 48 hour(s))  Hepatic function panel     Status: Abnormal   Collection Time: 12/18/16  3:24 PM  Result Value Ref Range   Total Protein 8.3 (H) 6.5 - 8.1 g/dL   Albumin 3.9 3.5 - 5.0 g/dL   AST 15 15 - 41 U/L   ALT 14 14 - 54 U/L   Alkaline Phosphatase 94 38 - 126 U/L   Total Bilirubin 0.4 0.3 - 1.2 mg/dL   Bilirubin, Direct <0.1 (L) 0.1 - 0.5 mg/dL   Indirect Bilirubin NOT CALCULATED 0.3 - 0.9  mg/dL  IgG, IgA, IgM     Status: Abnormal   Collection Time: 12/18/16  3:24 PM  Result Value Ref Range   IgG (Immunoglobin G), Serum 2,041 (H) 700 - 1,600 mg/dL   IgA 225 64 - 422 mg/dL   IgM, Serum 42 26 - 217 mg/dL    Comment: (NOTE) Performed At: [facility] [place], [place] [id number] Lindon Romp MD [id number]   Beta 2 microglobulin, serum     Status: None   Collection Time: 12/18/16  3:24 PM  Result Value Ref Range   Beta-2 Microglobulin 2.4 0.6 - 2.4 mg/L    Comment: (NOTE) Siemens Immulite 2000 Immunochemiluminometric assay [facility]) Performed At: [facility] [address] [place], [place] [id number] Lindon Romp MD [id number]    No results found.  Assessment/Plan:  1.  History of breast cancer 83 year old female 3 months status post left breast lumpectomy. Doing very well. Discussed that she is due for repeat mammogram in 3 months, to be 6 months from the surgery site. Discussed that we would follow-up in clinic for an additional exam after that mammogram. Also counseled her to continue self breast exams and that should she find anything new or concerning she is to return to clinic immediately for follow-up. All questions answered to the patient's satisfaction and she'll return to clinic again in 3 months.     Clayburn Pert, MD [facility] General Surgeon  12/19/2016,9:27 AM

## 2016-12-20 ENCOUNTER — Telehealth: Payer: Self-pay

## 2016-12-20 ENCOUNTER — Inpatient Hospital Stay: Payer: Medicare Other

## 2016-12-20 DIAGNOSIS — D472 Monoclonal gammopathy: Secondary | ICD-10-CM

## 2016-12-20 DIAGNOSIS — C50212 Malignant neoplasm of upper-inner quadrant of left female breast: Secondary | ICD-10-CM | POA: Diagnosis not present

## 2016-12-20 LAB — IMMUNOFIXATION ELECTROPHORESIS
IgA: 227 mg/dL (ref 64–422)
IgG (Immunoglobin G), Serum: 2081 mg/dL — ABNORMAL HIGH (ref 700–1600)
IgM, Serum: 43 mg/dL (ref 26–217)
Total Protein ELP: 7.9 g/dL (ref 6.0–8.5)

## 2016-12-20 NOTE — Telephone Encounter (Signed)
Called patient once again but on another number that she had listed. No answer. Left voicemail for return phone call.

## 2016-12-20 NOTE — Telephone Encounter (Signed)
Mammogram and Ultrasound scheduled for May 8th, 1040am at [facility]  Patient will then follow-up with Dr. Adonis Huguenin on 03/21/17 at 0900am.  Call made to patient but I am unable to reach her at this time. I will try again later.

## 2016-12-23 ENCOUNTER — Encounter: Payer: Self-pay | Admitting: Radiation Oncology

## 2016-12-23 ENCOUNTER — Ambulatory Visit
Admission: RE | Admit: 2016-12-23 | Discharge: 2016-12-23 | Disposition: A | Discharge status: Home / Self Care | Payer: Medicare Other | Source: Ambulatory Visit | Attending: Radiation Oncology | Admitting: Radiation Oncology

## 2016-12-23 VITALS — BP 140/71 | HR 62 | Temp 98.0°F | Resp 18 | Wt 169.6 lb

## 2016-12-23 DIAGNOSIS — Z79811 Long term (current) use of aromatase inhibitors: Secondary | ICD-10-CM | POA: Diagnosis not present

## 2016-12-23 DIAGNOSIS — Z923 Personal history of irradiation: Secondary | ICD-10-CM | POA: Insufficient documentation

## 2016-12-23 DIAGNOSIS — C50312 Malignant neoplasm of lower-inner quadrant of left female breast: Secondary | ICD-10-CM

## 2016-12-23 DIAGNOSIS — Z170 Estrogen receptor positive status [ER+]: Secondary | ICD-10-CM | POA: Diagnosis not present

## 2016-12-23 DIAGNOSIS — C50212 Malignant neoplasm of upper-inner quadrant of left female breast: Secondary | ICD-10-CM | POA: Diagnosis present

## 2016-12-23 LAB — UPEP/TP, 24-HR URINE
Albumin, U: 0 %
Alpha 1, Urine: 0 %
Alpha 2, Urine: 0 %
Beta, Urine: 0 %
Gamma Globulin, Urine: 0 %
Total Protein, Urine-Ur/day: 74 mg/24 hr (ref 30–150)
Total Protein, Urine: 5.7 mg/dL
Total Volume: 1300

## 2016-12-23 NOTE — Telephone Encounter (Signed)
Call made to patient at this time. Message was left with family member, with Mammogram/US and follow-up appointment.   A letter has also been sent to patient in mail.

## 2016-12-23 NOTE — Progress Notes (Signed)
Radiation Oncology Follow up Note  Name: Erica Jackson   Date:   12/23/2016 MRN:  [id number] DOB: Feb 27, 1934    This 83 y.o. female presents to the clinic today for one-month follow-up status post whole breast radiation to her left breast for stage I invasive mammary carcinoma.  REFERRING PROVIDER: Sherrin Daisy, MD  HPI: patient is an 83 year old female now 1 month out having completed whole breast radiation to her left breast for stage I ER positive PR negative HER-2/neu negative invasive mammary carcinoma. Seen today in routine follow-up she is doing well. She specifically denies breast tenderness cough or bone pain..she is currently on Femara tolerating that well without side effect.  COMPLICATIONS OF TREATMENT: none  FOLLOW UP COMPLIANCE: keeps appointments   PHYSICAL EXAM:  BP 140/71   Pulse 62   Temp 98 F (36.7 C)   Resp 18   Wt 169 lb 10.3 oz (76.9 kg)   BMI 28.23 kg/m  Lungs are clear to A&P cardiac examination essentially unremarkable with regular rate and rhythm. No dominant mass or nodularity is noted in either breast in 2 positions examined. Incision is well-healed. No axillary or supraclavicular adenopathy is appreciated. Cosmetic result is excellent.still some hyperpigmentation of the skin which would expect. Well-developed well-nourished patient in NAD. HEENT reveals PERLA, EOMI, discs not visualized.  Oral cavity is clear. No oral mucosal lesions are identified. Neck is clear without evidence of cervical or supraclavicular adenopathy. Lungs are clear to A&P. Cardiac examination is essentially unremarkable with regular rate and rhythm without murmur rub or thrill. Abdomen is benign with no organomegaly or masses noted. Motor sensory and DTR levels are equal and symmetric in the upper and lower extremities. Cranial nerves II through XII are grossly intact. Proprioception is intact. No peripheral adenopathy or edema is identified. No motor or sensory levels are noted. Crude  visual fields are within normal range.  RADIOLOGY RESULTS: no current films for review  PLAN: at the present time patient is doing well 1 month out from whole breast radiation. I am please were overall progress. I've asked to see her back in 4-5 months for follow-up. She continues on aspirin and by estrogen therapy. Patient is to call sooner with any concerns.  I would like to take this opportunity to thank you for allowing me to participate in the care of your patient.Armstead Peaks., MD

## 2017-01-08 ENCOUNTER — Ambulatory Visit
Admission: RE | Admit: 2017-01-08 | Discharge: 2017-01-08 | Disposition: A | Discharge status: Home / Self Care | Payer: Medicare Other | Source: Ambulatory Visit | Attending: Hematology and Oncology | Admitting: Hematology and Oncology

## 2017-01-08 ENCOUNTER — Inpatient Hospital Stay (HOSPITAL_BASED_OUTPATIENT_CLINIC_OR_DEPARTMENT_OTHER): Payer: Medicare Other | Admitting: Hematology and Oncology

## 2017-01-08 ENCOUNTER — Other Ambulatory Visit: Payer: Self-pay | Admitting: *Deleted

## 2017-01-08 ENCOUNTER — Inpatient Hospital Stay: Payer: Medicare Other | Attending: Hematology and Oncology

## 2017-01-08 ENCOUNTER — Encounter: Payer: Self-pay | Admitting: Hematology and Oncology

## 2017-01-08 VITALS — BP 130/69 | HR 57 | Temp 98.1°F | Resp 18 | Wt 172.2 lb

## 2017-01-08 DIAGNOSIS — I10 Essential (primary) hypertension: Secondary | ICD-10-CM

## 2017-01-08 DIAGNOSIS — D472 Monoclonal gammopathy: Secondary | ICD-10-CM

## 2017-01-08 DIAGNOSIS — Z923 Personal history of irradiation: Secondary | ICD-10-CM

## 2017-01-08 DIAGNOSIS — Z79899 Other long term (current) drug therapy: Secondary | ICD-10-CM

## 2017-01-08 DIAGNOSIS — Z87891 Personal history of nicotine dependence: Secondary | ICD-10-CM

## 2017-01-08 DIAGNOSIS — K219 Gastro-esophageal reflux disease without esophagitis: Secondary | ICD-10-CM | POA: Insufficient documentation

## 2017-01-08 DIAGNOSIS — Z170 Estrogen receptor positive status [ER+]: Principal | ICD-10-CM

## 2017-01-08 DIAGNOSIS — Z809 Family history of malignant neoplasm, unspecified: Secondary | ICD-10-CM | POA: Diagnosis not present

## 2017-01-08 DIAGNOSIS — C50212 Malignant neoplasm of upper-inner quadrant of left female breast: Secondary | ICD-10-CM

## 2017-01-08 DIAGNOSIS — E7800 Pure hypercholesterolemia, unspecified: Secondary | ICD-10-CM | POA: Diagnosis not present

## 2017-01-08 DIAGNOSIS — Z7982 Long term (current) use of aspirin: Secondary | ICD-10-CM

## 2017-01-08 DIAGNOSIS — Z79811 Long term (current) use of aromatase inhibitors: Secondary | ICD-10-CM | POA: Diagnosis not present

## 2017-01-08 LAB — HEPATIC FUNCTION PANEL
ALT: 13 U/L — ABNORMAL LOW (ref 14–54)
AST: 18 U/L (ref 15–41)
Albumin: 3.7 g/dL (ref 3.5–5.0)
Alkaline Phosphatase: 91 U/L (ref 38–126)
Bilirubin, Direct: <0.1 mg/dL — ABNORMAL LOW (ref 0.1–0.5)
Total Bilirubin: 0.2 mg/dL — ABNORMAL LOW (ref 0.3–1.2)
Total Protein: 7.7 g/dL (ref 6.5–8.1)

## 2017-01-08 NOTE — Progress Notes (Signed)
[facility] day:  01/08/2017  Chief Complaint: Erica Jackson is a 83 y.o. female with stage I left breast cancer who is seen for review of a monoclonal gammopathy work-up and 1 month assessment on Femara.  HPI:  The patient was last seen in the medical oncology clinic on 12/18/2016.  At that time, recent SPEP had revealed a 0.4 gm/dL monoclonal protein.  A work-up was initiated.  Immunofixation revealed an IgG monoclonal protein with kappa light chain specificity.  IgG was 2081.  IgA was 227.  IgM was 43.  Kappa free light chains were 45.7, lambda free light chains were 35.8 with a ratio of 1.28 (normal).  Beta 2 microglobulin was 2.4.  24 hour urine revealed no monoclonal protein or free light chains.  She has not had her bone survey.  Symptomatically, she denies any concerns.  She is tolerating her Femara well.   Past Medical History:  Diagnosis Date  . Breast cancer ([place])    Lower inner quadrant left breast  . Cataract   . Dysrhythmia    no problems in a long time  . GERD (gastroesophageal reflux disease)   . Headache   . Hypercholesterolemia   . Hypertension     Past Surgical History:  Procedure Laterality Date  . ABDOMINAL HYSTERECTOMY    . BREAST BIOPSY Left 08/28/2016   path pending  . BREAST LUMPECTOMY WITH SENTINEL LYMPH NODE BIOPSY Left 09/17/2016   Procedure: BREAST LUMPECTOMY WITH SENTINEL LYMPH NODE BX;  Surgeon: Clayburn Pert, MD;  Location: [facility] ORS;  Service: General;  Laterality: Left;  . EYE SURGERY Left 2015    Family History  Problem Relation Age of Onset  . Stroke Mother   . Cancer Sister   . Cancer Brother   . Cancer Daughter   . Cancer Brother   . Cancer Brother   . Cancer Brother   . Cancer Brother   . Cancer Brother   . Breast cancer Neg Hx    The patient notes longevity in her family.  Her aunt lived to 20.  Her great grandmother lived to 69.  Her sister has had multiple myeloma x 10  years.  Social History:  reports that she quit smoking about 27 years ago. She smoked 1.00 pack per day. She quit smokeless tobacco use about 27 years ago. She reports that she does not drink alcohol or use drugs.  The patient previously smoked 1 pack/week for 5-10 years.  She stopped smoking in 1990.  The patient is accompanied by her daughter, Erica Jackson, a Software engineer, today.   Allergies: No Known Allergies  Current Medications: Current Outpatient Prescriptions  Medication Sig Dispense Refill  . aspirin EC 81 MG tablet Take 81 mg by mouth daily.     Marland Kitchen atenolol (TENORMIN) 25 MG tablet TAKE ONE TABLET BY MOUTH ONCE DAILY    . docusate sodium (COLACE) 100 MG capsule Take 1 capsule (100 mg total) by mouth 2 (two) times daily. 10 capsule 0  . felodipine (PLENDIL) 5 MG 24 hr tablet Take 5 mg by mouth daily.     . isosorbide dinitrate (ISORDIL) 20 MG tablet Take 20 mg by mouth 2 (two) times daily.     Marland Kitchen letrozole (FEMARA) 2.5 MG tablet Take 1 tablet (2.5 mg total) by mouth daily. 30 tablet 2  . lovastatin (MEVACOR) 10 MG tablet TAKE ONE TABLET BY MOUTH ONCE DAILY WITH DINNER    . omeprazole (PRILOSEC) 20 MG  capsule Take 20 mg by mouth daily.     . potassium chloride SA (K-DUR,KLOR-CON) 20 MEQ tablet Take 20 mEq by mouth daily.     . propranolol (INDERAL) 20 MG tablet Take by mouth.     No current facility-administered medications for this visit.     Review of Systems:  GENERAL:  Feels good.  No fevers or sweats.  Weight gain of 1 pound. PERFORMANCE STATUS (ECOG):  1 HEENT:  No visual changes, runny nose, sore throat, mouth sores or tenderness. Lungs: No shortness of breath or cough.  No hemoptysis. Cardiac:  No chest pain, palpitations, orthopnea, or PND. GI:  Constipation.  No nausea, vomiting, diarrhea, constipation, melena or hematochezia.  Colonoscopy < 10 years ago.   GU:  No urgency, frequency, dysuria, or hematuria. Musculoskeletal:  No back pain.  No joint pain.  No muscle  tenderness. Extremities:  No pain or swelling in left arm. Skin:  Itching in left axilla. No rashes or skin changes. Neuro:  Headache once in awhile.  No numbness or weakness, balance or coordination issues. Endocrine:  No diabetes, thyroid issues, hot flashes or night sweats. Psych:  No mood changes, depression or anxiety. Pain:  No focal pain.  Review of systems:  All other systems reviewed and found to be negative.  Physical Exam: Blood pressure 130/69, pulse (!) 57, temperature 98.1 F (36.7 C), temperature source Tympanic, resp. rate 18, weight 172 lb 2.9 oz (78.1 kg). GENERAL:  Well developed, well nourished, elderly woman sitting comfortably in the exam room in no acute distress. MENTAL STATUS:  Alert and oriented to person, place and time. HEAD:  Pearline Cables hair.  Normocephalic, atraumatic, face symmetric, no Cushingoid features. EYES:  Brown eyes.  Amblyopia.  Pupils equal round and reactive to light and accomodation.  No conjunctivitis or scleral icterus. ENT:  Oropharynx clear without lesion.  Tongue normal.  Upper and lower dentures.  Mucous membranes moist.  RESPIRATORY:  Clear to auscultation without rales, wheezes or rhonchi. CARDIOVASCULAR:  Regular rate and rhythm without murmur, rub or gallop. BREAST:  Fibrocystic changes bilaterally.  Right breast without masses, skin changes or nipple discharge.  Left breast with superior arched incision with scarring superiorly.  No skin changes or nipple discharge.  ABDOMEN:  Soft, non-tender, with active bowel sounds, and no hepatosplenomegaly.  No masses. SKIN:  No rashes, ulcers or lesions. EXTREMITIES: No edema, no skin discoloration or tenderness.  No palpable cords. LYMPH NODES:  Left axillary ridge.  No palpable cervical, supraclavicular, axillary or inguinal adenopathy  NEUROLOGICAL: Unremarkable. PSYCH:  Appropriate   Appointment on 01/08/2017  Component Date Value Ref Range Status  . Total Protein 01/08/2017 7.7  6.5 - 8.1 g/dL  Final  . Albumin 01/08/2017 3.7  3.5 - 5.0 g/dL Final  . AST 01/08/2017 18  15 - 41 U/L Final  . ALT 01/08/2017 13* 14 - 54 U/L Final  . Alkaline Phosphatase 01/08/2017 91  38 - 126 U/L Final  . Total Bilirubin 01/08/2017 0.2* 0.3 - 1.2 mg/dL Final  . Bilirubin, Direct 01/08/2017 <0.1* 0.1 - 0.5 mg/dL Final  . Indirect Bilirubin 01/08/2017 NOT CALCULATED  0.3 - 0.9 mg/dL Final    Assessment:  KHYLA MCCUMBERS is a 83 y.o. female with stage I left breast cancer s/p lumpectomy and sentinel lymph node biopsy on 09/17/2016.  Pathology revealed a 1.0 cm grade II invasive mammary carcinoma of no special type.  Margins were negative.  There was no tumor in 1 sentinel  lymph node.  Pathologic stage was T1bN0M0.  Mammogram and ultrasound on 08/16/2016 revealed a 1.1 x 0.6 x 0.8 cm mass at the 10 o'clock position.  Left breast biopsy on 08/28/2016 revealed a 0.9 cm grade II invasive mammary carcinoma.  Tumor was ER positive (> 90%), PR negative, and Her2 2+ (negative by FISH).  CA27.29 was 21.7 on 09/11/2016.  She completed radiation on 11/28/2016.  She began Femara on 12/11/2016.  Bone density study on 09/25/2016 was normal with a T score of 0.4 in the left femoral neck.   She has a history of an elevated protein (8.3 on 09/11/2016) with a normal albumen.  SPEP on 12/11/2016 revealed a 0.4 gm/dL monoclonal protein.  She has mild renal insufficiency (Cr 1.28; CrCl 45 ml/min).  Work-up on 12/18/2016 revealed Immunofixation revealed an IgG monoclonal protein with kappa light chain specificity.  IgG was 2081.  Free light chain ratio was 1.28 (normal).  Beta 2 microglobulin was 2.4 (normal).  24 hour urine revealed no monoclonal protein or free light chains.  Symptomatically, she feels good.  Exam is stable.  Plan: 1.  Labs today:  liver function tests. 2.  Discuss work-up of monoclonal gammopathy.  She appears to have a monoclonal gammopathy of unknown significance (MGUS).  She has no anemia or hypercalcemia.   She has chronic mild renal insufficiency.  Discuss follow-up bone survey.  Discuss checking SPEP every 6 months secondary to risk of transformation (1% every year) to a lymphoproliferative disorder. 3.  Bone survey today. 4.  Continue Femara. 5.  RTC in 3 months for MD assessment and labs (CBC with diff, CMP, CA27.29).   Nolon Stalls, MD 01/08/2017, 10:55 AM

## 2017-01-08 NOTE — Progress Notes (Signed)
Patient offers no complaints today. 

## 2017-02-19 ENCOUNTER — Telehealth: Payer: Self-pay

## 2017-02-19 NOTE — Telephone Encounter (Signed)
Spoke with [name] at this time and provided number to change her mothers appointment and location.

## 2017-02-19 NOTE — Telephone Encounter (Signed)
Erica Jackson (patient's daughter) is calling in behalf of her mother, Erica Jackson. Erica Jackson is scheduled for a mammogram at [facility] May 8 at 10:40. She would like to go to the mammogram location in [facility] and wants to see if that can be scheduled. Please call Erica Jackson at [phone] and advice.

## 2017-03-11 ENCOUNTER — Ambulatory Visit
Admission: RE | Admit: 2017-03-11 | Discharge: 2017-03-11 | Disposition: A | Discharge status: Home / Self Care | Payer: Medicare Other | Source: Ambulatory Visit | Attending: General Surgery | Admitting: General Surgery

## 2017-03-11 DIAGNOSIS — Z853 Personal history of malignant neoplasm of breast: Secondary | ICD-10-CM | POA: Insufficient documentation

## 2017-03-11 HISTORY — DX: Personal history of irradiation: Z92.3

## 2017-03-12 ENCOUNTER — Other Ambulatory Visit: Payer: Self-pay | Admitting: Hematology and Oncology

## 2017-03-12 DIAGNOSIS — Z170 Estrogen receptor positive status [ER+]: Principal | ICD-10-CM

## 2017-03-12 DIAGNOSIS — C50212 Malignant neoplasm of upper-inner quadrant of left female breast: Secondary | ICD-10-CM

## 2017-03-21 ENCOUNTER — Ambulatory Visit (INDEPENDENT_AMBULATORY_CARE_PROVIDER_SITE_OTHER): Payer: Medicare Other | Admitting: General Surgery

## 2017-03-21 ENCOUNTER — Telehealth: Payer: Self-pay

## 2017-03-21 ENCOUNTER — Encounter: Payer: Self-pay | Admitting: General Surgery

## 2017-03-21 VITALS — BP 183/77 | HR 65 | Temp 98.0°F | Ht 65.0 in | Wt 172.0 lb

## 2017-03-21 DIAGNOSIS — Z853 Personal history of malignant neoplasm of breast: Secondary | ICD-10-CM | POA: Diagnosis not present

## 2017-03-21 NOTE — Progress Notes (Signed)
Outpatient Surgical Follow Up  03/21/2017  Erica Jackson is an 83 y.o. female.   Chief Complaint  Patient presents with  . Follow-up    6 Month Left Lumpectomy-09/17/16-Dr.Ajna Moors    HPI: 83 year old female returns to clinic for follow-up secondary to breast cancer. She was last seen 3 months ago. Since her last visit she is continue perform self breast exams and has not had any new lumps or bumps. She had her 6 month mammogram performed last week and she is here for the result review and for a repeat exam. Patient states she is doing well. She denies any fevers, chills, nausea, vomiting, chest pain, shortness breath, diarrhea, constipation. She has no active complaintsl.  Past Medical History:  Diagnosis Date  . Breast cancer ([place]) 2017   Lower inner quadrant left breast with lumpectomy and rad tx  . Cataract   . Dysrhythmia    no problems in a long time  . GERD (gastroesophageal reflux disease)   . Headache   . Hypercholesterolemia   . Hypertension   . Personal history of radiation therapy 11/2016   for left breast ca    Past Surgical History:  Procedure Laterality Date  . ABDOMINAL HYSTERECTOMY    . BREAST BIOPSY Left 08/28/2016   invasive mammary ca  . BREAST LUMPECTOMY Left 09/2016   invasive mammary carcinoma with clear margins and rad tx  . BREAST LUMPECTOMY WITH SENTINEL LYMPH NODE BIOPSY Left 09/17/2016   Procedure: BREAST LUMPECTOMY WITH SENTINEL LYMPH NODE BX;  Surgeon: Clayburn Pert, MD;  Location: [facility] ORS;  Service: General;  Laterality: Left;  . EYE SURGERY Left 2015    Family History  Problem Relation Age of Onset  . Stroke Mother   . Cancer Sister   . Cancer Brother   . Cancer Daughter   . Cancer Brother   . Cancer Brother   . Cancer Brother   . Cancer Brother   . Cancer Brother   . Breast cancer Neg Hx     Social History:  reports that she quit smoking about 27 years ago. She smoked 1.00 pack per day. She quit smokeless tobacco use about 27  years ago. She reports that she does not drink alcohol or use drugs.  Allergies: No Known Allergies  Medications reviewed.    ROS A multipoint review of systems was completed, all pertinent positives and negatives are documented within the history of present illness the remainder are negative   BP (!) 183/77   Pulse 65   Temp 98 F (36.7 C) (Oral)   Ht 5\' 5"  (1.651 m)   Wt 78 kg (172 lb)   BMI 28.62 kg/m   Physical Exam Gen.: No acute distress Neck: Supple and nontender Lymph nodes: No evidence of cervical, clavicular, axillary lymphadenopathy Breast: Bilateral breast examined. No findings to the right breast or axilla. Left breast with a well-healed lumpectomy and sentinel lymph node biopsy incision site. Increased density secondary to radiation. But no dominant masses or palpable concerns. Chest: Clear to sedation Heart: Regular rhythm Abdomen: Soft and nontender Skin: 2 benign epidermal skin cyst on the posterior back identified without signs of infection. They're nontender.    No results found for this or any previous visit (from the past 48 hour(s)). No results found.  Assessment/Plan:  1. History of breast cancer 83 year old female now 6 months status post lumpectomy with sentinel lymph node biopsy for stage I breast cancer. Patient is doing very well. Recent mammogram without any mammographic  findings of concern. Discussed the recommendations for repeat mammogram in 1 year, I discussed that I would see her back afterwards for a repeat exam. Counseled her to continue doing self exams and to follow-up immediately should she find any new findings. Also discussed the benign cyst on her back and that should they ever bother her they could be removed in clinic. She voiced understanding and will follow-up in clinic in 1 year or on an as-needed basis.  A total of 15 minutes was used on this encounter with greater than 50% of it used for counseling her coordination of  care.     Clayburn Pert, MD FACS General Surgeon  03/21/2017,9:20 AM

## 2017-03-21 NOTE — Addendum Note (Signed)
Addended by: Celene Kras on: 03/21/2017 02:30 PM   Modules accepted: Orders

## 2017-03-21 NOTE — Telephone Encounter (Signed)
Spoke with Erica Jackson at this time. Patient has appointment on 08/07/17 [name] @ 11:20 am.  She will follow up with Dr.Woodham on 08/18/17 @ 8:45 am.  Appointment reminders mailed. Patient's daughter Erica Jackson verbalized understanding.

## 2017-03-21 NOTE — Patient Instructions (Signed)
We would like for you to have a Mammogram in one year. We will schedule this closer to the one year mark and call you with the appointment time and date.  Please call our office if you have questions or concerns.

## 2017-04-09 ENCOUNTER — Inpatient Hospital Stay: Payer: Medicare Other

## 2017-04-09 ENCOUNTER — Encounter: Payer: Self-pay | Admitting: Hematology and Oncology

## 2017-04-09 ENCOUNTER — Inpatient Hospital Stay: Payer: Medicare Other | Attending: Hematology and Oncology | Admitting: Hematology and Oncology

## 2017-04-09 VITALS — BP 130/74 | HR 61 | Temp 96.5°F | Resp 20 | Wt 171.8 lb

## 2017-04-09 DIAGNOSIS — I10 Essential (primary) hypertension: Secondary | ICD-10-CM | POA: Insufficient documentation

## 2017-04-09 DIAGNOSIS — K219 Gastro-esophageal reflux disease without esophagitis: Secondary | ICD-10-CM | POA: Insufficient documentation

## 2017-04-09 DIAGNOSIS — D472 Monoclonal gammopathy: Secondary | ICD-10-CM | POA: Diagnosis not present

## 2017-04-09 DIAGNOSIS — C50212 Malignant neoplasm of upper-inner quadrant of left female breast: Secondary | ICD-10-CM

## 2017-04-09 DIAGNOSIS — Z7982 Long term (current) use of aspirin: Secondary | ICD-10-CM | POA: Diagnosis not present

## 2017-04-09 DIAGNOSIS — Z170 Estrogen receptor positive status [ER+]: Secondary | ICD-10-CM | POA: Diagnosis not present

## 2017-04-09 DIAGNOSIS — Z79811 Long term (current) use of aromatase inhibitors: Secondary | ICD-10-CM | POA: Insufficient documentation

## 2017-04-09 DIAGNOSIS — Z809 Family history of malignant neoplasm, unspecified: Secondary | ICD-10-CM | POA: Diagnosis not present

## 2017-04-09 DIAGNOSIS — E7800 Pure hypercholesterolemia, unspecified: Secondary | ICD-10-CM

## 2017-04-09 DIAGNOSIS — Z923 Personal history of irradiation: Secondary | ICD-10-CM | POA: Diagnosis not present

## 2017-04-09 DIAGNOSIS — Z87891 Personal history of nicotine dependence: Secondary | ICD-10-CM | POA: Insufficient documentation

## 2017-04-09 DIAGNOSIS — Z79899 Other long term (current) drug therapy: Secondary | ICD-10-CM

## 2017-04-09 LAB — CBC WITH DIFFERENTIAL/PLATELET
Basophils Absolute: 0 10*3/uL (ref 0–0.1)
Basophils Relative: 0 %
Eosinophils Absolute: 0 10*3/uL (ref 0–0.7)
Eosinophils Relative: 1 %
HCT: 34.4 % — ABNORMAL LOW (ref 35.0–47.0)
Hemoglobin: 11.2 g/dL — ABNORMAL LOW (ref 12.0–16.0)
Lymphocytes Relative: 21 %
Lymphs Abs: 1.2 10*3/uL (ref 1.0–3.6)
MCH: 29.9 pg (ref 26.0–34.0)
MCHC: 32.6 g/dL (ref 32.0–36.0)
MCV: 91.6 fL (ref 80.0–100.0)
Monocytes Absolute: 0.4 10*3/uL (ref 0.2–0.9)
Monocytes Relative: 7 %
Neutro Abs: 4.1 10*3/uL (ref 1.4–6.5)
Neutrophils Relative %: 71 %
Platelets: 193 10*3/uL (ref 150–440)
RBC: 3.75 MIL/uL — ABNORMAL LOW (ref 3.80–5.20)
RDW: 14.3 % (ref 11.5–14.5)
WBC: 5.8 10*3/uL (ref 3.6–11.0)

## 2017-04-09 LAB — COMPREHENSIVE METABOLIC PANEL
ALT: 17 U/L (ref 14–54)
AST: 17 U/L (ref 15–41)
Albumin: 3.6 g/dL (ref 3.5–5.0)
Alkaline Phosphatase: 93 U/L (ref 38–126)
Anion gap: 5 (ref 5–15)
BUN: 18 mg/dL (ref 6–20)
CO2: 26 mmol/L (ref 22–32)
Calcium: 9.1 mg/dL (ref 8.9–10.3)
Chloride: 106 mmol/L (ref 101–111)
Creatinine, Ser: 1.24 mg/dL — ABNORMAL HIGH (ref 0.44–1.00)
GFR calc Af Amer: 46 mL/min — ABNORMAL LOW (ref >60)
GFR calc non Af Amer: 40 mL/min — ABNORMAL LOW (ref >60)
Glucose, Bld: 109 mg/dL — ABNORMAL HIGH (ref 65–99)
Potassium: 3.8 mmol/L (ref 3.5–5.1)
Sodium: 137 mmol/L (ref 135–145)
Total Bilirubin: 0.5 mg/dL (ref 0.3–1.2)
Total Protein: 7.7 g/dL (ref 6.5–8.1)

## 2017-04-09 NOTE — Progress Notes (Signed)
[facility]-  Cancer Center  Clinic day:  04/09/2017  Chief Complaint: Erica Jackson is a 83 y.o. female with stage I left breast cancer and a monoclonal gammopathy of unknown significance (MGUS) who is seen for 3 month assessment on Femara.  HPI:  The patient was last seen in the medical oncology clinic on 01/08/2017.  At that time, she denied any concerns.  She was tolerating her Femara well.  She appeared to have a monoclonal gammopathy of unknown significance (MGUS).  She had not had her bone survey.  Bone survey on 01/08/2017 revealed no lytic lesions.  Symptomatically, she denies any concerns.  She is tolerating her Femara well.  She denies any bone pain.  She has an itchy spot on her right leg.   Past Medical History:  Diagnosis Date  . Breast cancer ([facility]) 2017   Lower inner quadrant left breast with lumpectomy and rad tx  . Cataract   . Dysrhythmia    no problems in a long time  . GERD (gastroesophageal reflux disease)   . Headache   . Hypercholesterolemia   . Hypertension   . Personal history of radiation therapy 11/2016   for left breast ca    Past Surgical History:  Procedure Laterality Date  . ABDOMINAL HYSTERECTOMY    . BREAST BIOPSY Left 08/28/2016   invasive mammary ca  . BREAST LUMPECTOMY Left 09/2016   invasive mammary carcinoma with clear margins and rad tx  . BREAST LUMPECTOMY WITH SENTINEL LYMPH NODE BIOPSY Left 09/17/2016   Procedure: BREAST LUMPECTOMY WITH SENTINEL LYMPH NODE BX;  Surgeon: Ricarda Frame, MD;  Location: [facility] ORS;  Service: General;  Laterality: Left;  . EYE SURGERY Left 2015    Family History  Problem Relation Age of Onset  . Stroke Mother   . Cancer Sister   . Cancer Brother   . Cancer Daughter   . Cancer Brother   . Cancer Brother   . Cancer Brother   . Cancer Brother   . Cancer Brother   . Breast cancer Neg Hx    The patient notes longevity in her family.  Her aunt lived to 58.  Her great grandmother  lived to 1.  Her sister has had multiple myeloma x 10 years.  Social History:  reports that she quit smoking about 27 years ago. She smoked 1.00 pack per day. She quit smokeless tobacco use about 27 years ago. She reports that she does not drink alcohol or use drugs.  The patient previously smoked 1 pack/week for 5-10 years.  She stopped smoking in 1990.  The patient is accompanied by her daughter, Erica Jackson, a Teacher, early years/pre, today.   Allergies: No Known Allergies  Current Medications: Current Outpatient Prescriptions  Medication Sig Dispense Refill  . acetaminophen (TYLENOL) 650 MG CR tablet Take 1 tablet by mouth every 8 (eight) hours.    Marland Kitchen aspirin EC 81 MG tablet Take 81 mg by mouth daily.     Marland Kitchen atenolol (TENORMIN) 25 MG tablet TAKE ONE TABLET BY MOUTH ONCE DAILY    . docusate sodium (COLACE) 100 MG capsule Take 1 capsule (100 mg total) by mouth 2 (two) times daily. 10 capsule 0  . felodipine (PLENDIL) 5 MG 24 hr tablet Take 5 mg by mouth daily.     . isosorbide dinitrate (ISORDIL) 20 MG tablet Take 20 mg by mouth 2 (two) times daily.     Marland Kitchen letrozole (FEMARA) 2.5 MG tablet TAKE 1 TABLET BY MOUTH ONCE  DAILY 30 tablet 2  . lovastatin (MEVACOR) 10 MG tablet TAKE ONE TABLET BY MOUTH ONCE DAILY WITH DINNER    . omeprazole (PRILOSEC) 20 MG capsule Take 20 mg by mouth daily.     . potassium chloride SA (K-DUR,KLOR-CON) 20 MEQ tablet Take 20 mEq by mouth daily.     . propranolol (INDERAL) 20 MG tablet Take by mouth.    . cycloSPORINE (RESTASIS) 0.05 % ophthalmic emulsion Apply 1 drop to eye daily as needed.     No current facility-administered medications for this visit.     Review of Systems:  GENERAL:  Feels good.  No fevers or sweats.  Weight down 1 pound. PERFORMANCE STATUS (ECOG):  1 HEENT:  No visual changes, runny nose, sore throat, mouth sores or tenderness. Lungs: No shortness of breath or cough.  No hemoptysis. Cardiac:  No chest pain, palpitations, orthopnea, or PND. GI:  No  nausea, vomiting, diarrhea, constipation, melena or hematochezia.  Colonoscopy < 10 years ago.   GU:  No urgency, frequency, dysuria, or hematuria. Musculoskeletal:  No back pain.  No joint pain.  No muscle tenderness. Extremities:  No pain or swelling in left arm. Skin:  Itchy spot on right leg. No rashes or skin changes. Neuro:  Headache once in awhile.  No numbness or weakness, balance or coordination issues. Endocrine:  No diabetes, thyroid issues, hot flashes or night sweats. Psych:  No mood changes, depression or anxiety. Pain:  No focal pain.  Review of systems:  All other systems reviewed and found to be negative.  Physical Exam: Blood pressure 130/74, pulse 61, temperature (!) 96.5 F (35.8 C), temperature source Tympanic, resp. rate 20, weight 171 lb 13.6 oz (78 kg). GENERAL:  Well developed, well nourished, elderly woman sitting comfortably in the exam room in no acute distress. MENTAL STATUS:  Alert and oriented to person, place and time. HEAD:  Pearline Cables hair.  Normocephalic, atraumatic, face symmetric, no Cushingoid features. EYES:  Brown eyes.  Amblyopia right eye.  Pupils equal round and reactive to light and accomodation.  No conjunctivitis or scleral icterus. ENT:  Oropharynx clear without lesion.  Tongue normal.  Upper and lower dentures.  Mucous membranes moist.  RESPIRATORY:  Clear to auscultation without rales, wheezes or rhonchi. CARDIOVASCULAR:  Regular rate and rhythm without murmur, rub or gallop. BREAST:  Fibrocystic changes bilaterally.  Right breast without masses, skin changes or nipple discharge.  Left breast with superior arched incision with scarring superiorly.  No skin changes or nipple discharge.  ABDOMEN:  Soft, non-tender, with active bowel sounds, and no hepatosplenomegaly.  No masses. SKIN:  No rashes, ulcers or lesions. EXTREMITIES: No edema, no skin discoloration or tenderness.  No palpable cords. LYMPH NODES:  Left axillary ridge.  No palpable cervical,  supraclavicular, axillary or inguinal adenopathy  NEUROLOGICAL: Unremarkable. PSYCH:  Appropriate   Appointment on 04/09/2017  Component Date Value Ref Range Status  . WBC 04/09/2017 5.8  3.6 - 11.0 K/uL Final  . RBC 04/09/2017 3.75* 3.80 - 5.20 MIL/uL Final  . Hemoglobin 04/09/2017 11.2* 12.0 - 16.0 g/dL Final  . HCT 04/09/2017 34.4* 35.0 - 47.0 % Final  . MCV 04/09/2017 91.6  80.0 - 100.0 fL Final  . MCH 04/09/2017 29.9  26.0 - 34.0 pg Final  . MCHC 04/09/2017 32.6  32.0 - 36.0 g/dL Final  . RDW 04/09/2017 14.3  11.5 - 14.5 % Final  . Platelets 04/09/2017 193  150 - 440 K/uL Final  . Neutrophils Relative %  04/09/2017 71  % Final  . Neutro Abs 04/09/2017 4.1  1.4 - 6.5 K/uL Final  . Lymphocytes Relative 04/09/2017 21  % Final  . Lymphs Abs 04/09/2017 1.2  1.0 - 3.6 K/uL Final  . Monocytes Relative 04/09/2017 7  % Final  . Monocytes Absolute 04/09/2017 0.4  0.2 - 0.9 K/uL Final  . Eosinophils Relative 04/09/2017 1  % Final  . Eosinophils Absolute 04/09/2017 0.0  0 - 0.7 K/uL Final  . Basophils Relative 04/09/2017 0  % Final  . Basophils Absolute 04/09/2017 0.0  0 - 0.1 K/uL Final  . Sodium 04/09/2017 137  135 - 145 mmol/L Final  . Potassium 04/09/2017 3.8  3.5 - 5.1 mmol/L Final  . Chloride 04/09/2017 106  101 - 111 mmol/L Final  . CO2 04/09/2017 26  22 - 32 mmol/L Final  . Glucose, Bld 04/09/2017 109* 65 - 99 mg/dL Final  . BUN 04/09/2017 18  6 - 20 mg/dL Final  . Creatinine, Ser 04/09/2017 1.24* 0.44 - 1.00 mg/dL Final  . Calcium 04/09/2017 9.1  8.9 - 10.3 mg/dL Final  . Total Protein 04/09/2017 7.7  6.5 - 8.1 g/dL Final  . Albumin 04/09/2017 3.6  3.5 - 5.0 g/dL Final  . AST 04/09/2017 17  15 - 41 U/L Final  . ALT 04/09/2017 17  14 - 54 U/L Final  . Alkaline Phosphatase 04/09/2017 93  38 - 126 U/L Final  . Total Bilirubin 04/09/2017 0.5  0.3 - 1.2 mg/dL Final  . GFR calc non Af Amer 04/09/2017 40* >60 mL/min Final  . GFR calc Af Amer 04/09/2017 46* >60 mL/min Final    Comment: (NOTE) The eGFR has been calculated using the CKD EPI equation. This calculation has not been validated in all clinical situations. eGFR's persistently <60 mL/min signify possible Chronic Kidney Disease.   . Anion gap 04/09/2017 5  5 - 15 Final    Assessment:  MAKINZE JANI is a 83 y.o. female with stage I left breast cancer s/p lumpectomy and sentinel lymph node biopsy on 09/17/2016.  Pathology revealed a 1.0 cm grade II invasive mammary carcinoma of no special type.  Margins were negative.  There was no tumor in 1 sentinel lymph node.  Pathologic stage was T1bN0M0.  Mammogram and ultrasound on 08/16/2016 revealed a 1.1 x 0.6 x 0.8 cm mass at the 10 o'clock position.  Left breast biopsy on 08/28/2016 revealed a 0.9 cm grade II invasive mammary carcinoma.  Tumor was ER positive (> 90%), PR negative, and Her2 2+ (negative by FISH).  CA27.29 was 21.7 on 09/11/2016.  She completed radiation on 11/28/2016.  She began Femara on 12/11/2016.  Bone density study on 09/25/2016 was normal with a T score of 0.4 in the left femoral neck.   She has a history of an elevated protein (8.3 on 09/11/2016) with a normal albumen.  SPEP on 12/11/2016 revealed a 0.4 gm/dL monoclonal protein.  She has mild renal insufficiency (Cr 1.28; CrCl 45 ml/min).  Work-up on 12/18/2016 revealed Immunofixation revealed an IgG monoclonal protein with kappa light chain specificity.  IgG was 2081.  Free light chain ratio was 1.28 (normal).  Beta 2 microglobulin was 2.4 (normal).  24 hour urine revealed no monoclonal protein or free light chains.  Bone survey on 01/08/2017 revealed no lytic lesions.  M-spike has been followed:  0.4 gm/dL on 12/11/2016.  Symptomatically, she feels good.  Exam is stable.  Plan: 1.  Labs today: CBC with diff, CMP, CA27.29.  2.  Review interval bone survey.  No lytic lesions.  Patient has a MGUS.  Discuss following SPEP every 6 months.  3.  Continue Femara. 4.  RTC in 3 months for MD  assessment and labs (CBC with diff, CMP, CA27.29, SPEP).   Nolon Stalls, MD 04/09/2017, 10:49 AM

## 2017-04-09 NOTE — Progress Notes (Signed)
Patient offers no concerns today.  Patient does have an area on her right thigh she would like MD to assess.

## 2017-04-10 LAB — CANCER ANTIGEN 27.29: CA 27.29: 20.9 U/mL (ref 0.0–38.6)

## 2017-06-10 ENCOUNTER — Encounter: Payer: Self-pay | Admitting: Radiation Oncology

## 2017-06-10 ENCOUNTER — Other Ambulatory Visit: Payer: Self-pay | Admitting: Hematology and Oncology

## 2017-06-10 ENCOUNTER — Ambulatory Visit
Admission: RE | Admit: 2017-06-10 | Discharge: 2017-06-10 | Disposition: A | Discharge status: Home / Self Care | Payer: Medicare Other | Source: Ambulatory Visit | Attending: Radiation Oncology | Admitting: Radiation Oncology

## 2017-06-10 VITALS — BP 120/61 | HR 61 | Temp 96.9°F | Wt 170.9 lb

## 2017-06-10 DIAGNOSIS — Z79811 Long term (current) use of aromatase inhibitors: Secondary | ICD-10-CM | POA: Diagnosis not present

## 2017-06-10 DIAGNOSIS — Z923 Personal history of irradiation: Secondary | ICD-10-CM | POA: Diagnosis not present

## 2017-06-10 DIAGNOSIS — C50212 Malignant neoplasm of upper-inner quadrant of left female breast: Secondary | ICD-10-CM

## 2017-06-10 DIAGNOSIS — C50312 Malignant neoplasm of lower-inner quadrant of left female breast: Secondary | ICD-10-CM | POA: Diagnosis not present

## 2017-06-10 DIAGNOSIS — Z170 Estrogen receptor positive status [ER+]: Principal | ICD-10-CM

## 2017-06-10 NOTE — Progress Notes (Signed)
Radiation Oncology Follow up Note  Name: Erica Jackson   Date:   06/10/2017 MRN:  [id number] DOB: November 01, 1934    This 83 y.o. female presents to the clinic today for six-month follow-up status post whole breast radiation to her left breast for stage I invasive mammary carcinoma.  REFERRING PROVIDER: Sherrin Daisy, MD  HPI: patient is a 83 year old female now seen out 6 months having completed whole breast radiation to her left breast for stage I ER positive PR negative HER-2/neu negative invasive mammary carcinoma smoke status post wide local excision. She seen today in routine follow-up is doing well she specifically denies breast tenderness cough or bone pain. She is currently on Femara tolerating that well without side effect. She is scheduled for mammograms this month..  COMPLICATIONS OF TREATMENT: none  FOLLOW UP COMPLIANCE: keeps appointments   PHYSICAL EXAM:  BP 120/61   Pulse 61   Temp (!) 96.9 F (36.1 C)   Wt 170 lb 13.7 oz (77.5 kg)   BMI 28.43 kg/m  Lungs are clear to A&P cardiac examination essentially unremarkable with regular rate and rhythm. No dominant mass or nodularity is noted in either breast in 2 positions examined. Incision is well-healed. No axillary or supraclavicular adenopathy is appreciated. Cosmetic result is excellent. Well-developed well-nourished patient in NAD. HEENT reveals PERLA, EOMI, discs not visualized.  Oral cavity is clear. No oral mucosal lesions are identified. Neck is clear without evidence of cervical or supraclavicular adenopathy. Lungs are clear to A&P. Cardiac examination is essentially unremarkable with regular rate and rhythm without murmur rub or thrill. Abdomen is benign with no organomegaly or masses noted. Motor sensory and DTR levels are equal and symmetric in the upper and lower extremities. Cranial nerves II through XII are grossly intact. Proprioception is intact. No peripheral adenopathy or edema is identified. No motor or sensory  levels are noted. Crude visual fields are within normal range.  RADIOLOGY RESULTS: mammograms will be reviewed will be reviewed when available  PLAN: present time patient is doing well recovering nicely from her radiation therapy treatments. I'm please were overall progress. She continues on Femara without side effect. She is a rescheduled follow-up mammograms. Based on her advanced age and difficulty with transportation will start seeing her once a year basis.patient family know to call sooner with any concerns.  I would like to take this opportunity to thank you for allowing me to participate in the care of your patient.Armstead Peaks., MD

## 2017-07-09 ENCOUNTER — Other Ambulatory Visit: Payer: Medicare Other

## 2017-07-09 ENCOUNTER — Ambulatory Visit: Payer: Medicare Other | Admitting: Hematology and Oncology

## 2017-07-15 NOTE — Progress Notes (Signed)
[facility] day:  07/16/2017  Chief Complaint: Erica Jackson is a 83 y.o. female with stage I left breast cancer and a monoclonal gammopathy of unknown significance (MGUS) who is seen for 3 month assessment on Femara.  HPI:  The patient was last seen in the medical oncology clinic on 04/09/2017.  At that time, she denied any concerns.  She was tolerating her Femara well. WBC 5.8 with an [place] of 4100. Hemoglobin 11.2, hematocrit 34.4, platelets 193,000. CA 27.29 was 20.9  Mammogram on 03/11/2017 revealed no evidence of malignancy status post lumpectomy.  She is under the care of Dr. Adonis Huguenin (surgeon). Repeat mammogram and bilateral breast ultrasound scheduled for 08/07/2017.  She was seen in follow-up by radiation oncology on 06/10/2017. Notes reviewed from Dr. Baruch Gouty that indicated that he was pleased with her progress, and has plans to see her back in one year for routine follow-up.  Symptomatically, she is feeling "good". She has weakness in her LEFT upper extremity that has been persistent since her surgery.  She denies numbness and swelling to the arm. Weakness is appreciated when she picks up "heavy stuff". Patient has no other physical complaints. She continues on the Femara with no perceived side effects.  She denies interval infections.    Past Medical History:  Diagnosis Date  . Breast cancer ([place]) 2017   Lower inner quadrant left breast with lumpectomy and rad tx  . Cataract   . Dysrhythmia    no problems in a long time  . GERD (gastroesophageal reflux disease)   . Headache   . Hypercholesterolemia   . Hypertension   . Personal history of radiation therapy 11/2016   for left breast ca    Past Surgical History:  Procedure Laterality Date  . ABDOMINAL HYSTERECTOMY    . BREAST BIOPSY Left 08/28/2016   invasive mammary ca  . BREAST LUMPECTOMY Left 09/2016   invasive mammary carcinoma with clear margins and rad tx  . BREAST  LUMPECTOMY WITH SENTINEL LYMPH NODE BIOPSY Left 09/17/2016   Procedure: BREAST LUMPECTOMY WITH SENTINEL LYMPH NODE BX;  Surgeon: Clayburn Pert, MD;  Location: [facility] ORS;  Service: General;  Laterality: Left;  . EYE SURGERY Left 2015    Family History  Problem Relation Age of Onset  . Stroke Mother   . Cancer Sister   . Cancer Brother   . Cancer Daughter   . Cancer Brother   . Cancer Brother   . Cancer Brother   . Cancer Brother   . Cancer Brother   . Breast cancer Neg Hx    The patient notes longevity in her family.  Her aunt lived to 64.  Her great grandmother lived to 61.  Her sister has had multiple myeloma x 10 years.  Social History:  reports that she quit smoking about 27 years ago. She smoked 1.00 pack per day. She quit smokeless tobacco use about 27 years ago. She reports that she does not drink alcohol or use drugs.  The patient previously smoked 1 pack/week for 5-10 years.  She stopped smoking in 1990.  Her daughter, [name], is a Software engineer.  The patient is alone today.   Allergies: No Known Allergies  Current Medications: Current Outpatient Prescriptions  Medication Sig Dispense Refill  . acetaminophen (TYLENOL) 650 MG CR tablet Take 1 tablet by mouth every 8 (eight) hours.    Marland Kitchen aspirin EC 81 MG tablet Take 81 mg by mouth daily.     Marland Kitchen  atenolol (TENORMIN) 25 MG tablet TAKE ONE TABLET BY MOUTH ONCE DAILY    . cycloSPORINE (RESTASIS) 0.05 % ophthalmic emulsion Apply 1 drop to eye daily as needed.    . docusate sodium (COLACE) 100 MG capsule Take 1 capsule (100 mg total) by mouth 2 (two) times daily. 10 capsule 0  . felodipine (PLENDIL) 5 MG 24 hr tablet Take 5 mg by mouth daily.     . isosorbide dinitrate (ISORDIL) 20 MG tablet Take 20 mg by mouth 2 (two) times daily.     Marland Kitchen letrozole (FEMARA) 2.5 MG tablet TAKE 1 TABLET BY MOUTH ONCE DAILY 30 tablet 2  . lovastatin (MEVACOR) 10 MG tablet TAKE ONE TABLET BY MOUTH ONCE DAILY WITH DINNER    . omeprazole (PRILOSEC) 20 MG  capsule Take 20 mg by mouth daily.     . potassium chloride SA (K-DUR,KLOR-CON) 20 MEQ tablet Take 20 mEq by mouth daily.      No current facility-administered medications for this visit.     Review of Systems:  GENERAL:  Feels good.  No fevers or sweats.  Weight up 1 pound.  PERFORMANCE STATUS (ECOG):  1 HEENT:  No visual changes, runny nose, sore throat, mouth sores or tenderness. Lungs: No shortness of breath or cough.  No hemoptysis. Cardiac:  No chest pain, palpitations, orthopnea, or PND. GI:  No nausea, vomiting, diarrhea, constipation, melena or hematochezia.  Colonoscopy < 10 years ago.   GU:  No urgency, frequency, dysuria, or hematuria. Musculoskeletal:  Left arm "weak".  No back pain.  No joint pain.  No muscle tenderness. Extremities:  No pain or swelling in left arm. Skin:  No rashes or skin changes. Neuro:  Rare headache.  Weakness in left arm when lifting heavy things.  No focal numbness, balance or coordination issues. Endocrine:  No diabetes, thyroid issues, hot flashes or night sweats. Psych:  No mood changes, depression or anxiety. Pain:  No focal pain.  Review of systems:  All other systems reviewed and found to be negative.  Physical Exam: Blood pressure 138/71, pulse 60, temperature (!) 96.9 F (36.1 C), temperature source Tympanic, resp. rate 18, weight 172 lb 6.4 oz (78.2 kg). GENERAL:  Well developed, well nourished, elderly woman sitting comfortably in the exam room in no acute distress. MENTAL STATUS:  Alert and oriented to person, place and time. HEAD:  Wearing a cap.  Gray hair.  Normocephalic, atraumatic, face symmetric, no Cushingoid features. EYES:  Glasses.  Brown eyes.  Amblyopia right eye.  Pupils equal round and reactive to light and accomodation.  No conjunctivitis or scleral icterus. ENT:  Oropharynx clear without lesion.  Tongue normal.  Upper and lower dentures.  Mucous membranes moist.  RESPIRATORY:  Clear to auscultation without rales, wheezes  or rhonchi. CARDIOVASCULAR:  Regular rate and rhythm without murmur, rub or gallop. BREAST:  Right breast without masses, skin changes or nipple discharge.  Fibrocystic changes superiorly.  Left breast with superior arched incision with scarring superiorly.  Mild inferior edema.  Axillary scarring.  No skin changes or nipple discharge.  ABDOMEN:  Soft, non-tender, with active bowel sounds, and no hepatosplenomegaly.  No masses. SKIN:  No rashes, ulcers or lesions. EXTREMITIES: No edema, no skin discoloration or tenderness.  No palpable cords. LYMPH NODES:  Left axillary ridge.  No palpable cervical, supraclavicular, axillary or inguinal adenopathy  NEUROLOGICAL:   Alert & oriented, motor strength 5/5 upper extremities; sensation intact.  PSYCH:  Appropriate   Appointment on 07/16/2017  Component Date Value Ref Range Status  . WBC 07/16/2017 6.8  3.6 - 11.0 K/uL Final  . RBC 07/16/2017 3.77* 3.80 - 5.20 MIL/uL Final  . Hemoglobin 07/16/2017 11.4* 12.0 - 16.0 g/dL Final  . HCT 07/16/2017 34.7* 35.0 - 47.0 % Final  . MCV 07/16/2017 91.9  80.0 - 100.0 fL Final  . MCH 07/16/2017 30.3  26.0 - 34.0 pg Final  . MCHC 07/16/2017 32.9  32.0 - 36.0 g/dL Final  . RDW 07/16/2017 14.8* 11.5 - 14.5 % Final  . Platelets 07/16/2017 217  150 - 440 K/uL Final  . Neutrophils Relative % 07/16/2017 72  % Final  . Neutro Abs 07/16/2017 4.8  1.4 - 6.5 K/uL Final  . Lymphocytes Relative 07/16/2017 20  % Final  . Lymphs Abs 07/16/2017 1.3  1.0 - 3.6 K/uL Final  . Monocytes Relative 07/16/2017 8  % Final  . Monocytes Absolute 07/16/2017 0.5  0.2 - 0.9 K/uL Final  . Eosinophils Relative 07/16/2017 1  % Final  . Eosinophils Absolute 07/16/2017 0.0  0 - 0.7 K/uL Final  . Basophils Relative 07/16/2017 1  % Final  . Basophils Absolute 07/16/2017 0.0  0 - 0.1 K/uL Final  . Sodium 07/16/2017 138  135 - 145 mmol/L Final  . Potassium 07/16/2017 3.8  3.5 - 5.1 mmol/L Final  . Chloride 07/16/2017 106  101 - 111 mmol/L  Final  . CO2 07/16/2017 26  22 - 32 mmol/L Final  . Glucose, Bld 07/16/2017 127* 65 - 99 mg/dL Final  . BUN 07/16/2017 <5* 6 - 20 mg/dL Final  . Creatinine, Ser 07/16/2017 1.36* 0.44 - 1.00 mg/dL Final  . Calcium 07/16/2017 9.0  8.9 - 10.3 mg/dL Final  . Total Protein 07/16/2017 7.7  6.5 - 8.1 g/dL Final  . Albumin 07/16/2017 3.6  3.5 - 5.0 g/dL Final  . AST 07/16/2017 18  15 - 41 U/L Final  . ALT 07/16/2017 12* 14 - 54 U/L Final  . Alkaline Phosphatase 07/16/2017 102  38 - 126 U/L Final  . Total Bilirubin 07/16/2017 0.5  0.3 - 1.2 mg/dL Final  . GFR calc non Af Amer 07/16/2017 35* >60 mL/min Final  . GFR calc Af Amer 07/16/2017 41* >60 mL/min Final   Comment: (NOTE) The eGFR has been calculated using the CKD EPI equation. This calculation has not been validated in all clinical situations. eGFR's persistently <60 mL/min signify possible Chronic Kidney Disease.   . Anion gap 07/16/2017 6  5 - 15 Final    Assessment:  ARTEMISA SLADEK is a 83 y.o. female with stage I left breast cancer s/p lumpectomy and sentinel lymph node biopsy on 09/17/2016.  Pathology revealed a 1.0 cm grade II invasive mammary carcinoma of no special type.  Margins were negative.  There was no tumor in 1 sentinel lymph node.  Pathologic stage was T1bN0M0.  Mammogram and ultrasound on 08/16/2016 revealed a 1.1 x 0.6 x 0.8 cm mass at the 10 o'clock position.  Left breast biopsy on 08/28/2016 revealed a 0.9 cm grade II invasive mammary carcinoma.  Tumor was ER positive (> 90%), PR negative, and Her2 2+ (negative by FISH).    She completed radiation on 11/28/2016.  She began Femara on 12/11/2016.  CA27.29 has been followed: 21.7 on 09/11/2016 and 20.9 on 04/09/2017.  Bilateral diagnostic mammogram on 03/11/2017 which revealed no evidence of malignancy s/p lumpectomy.  Bone density study on 09/25/2016 was normal with a T score of 0.4 in the left  femoral neck.   She has a history of an elevated protein (8.3 on  09/11/2016) with a normal albumen.  SPEP on 12/11/2016 revealed a 0.4 gm/dL monoclonal protein.  She has mild renal insufficiency (Cr 1.28; CrCl 45 ml/min).  Work-up on 12/18/2016 revealed an immunofixation with an IgG monoclonal protein with kappa light chain specificity.  IgG was 2081.  Free light chain ratio was 1.28 (normal).  Beta 2 microglobulin was 2.4 (normal).  24 hour urine revealed no monoclonal protein or free light chains.  Bone survey on 01/08/2017 revealed no lytic lesions.  M-spike has been followed:  0.4 gm/dL on 12/11/2016.  Symptomatically, she feels good.  She notes LEFT upper arm weakness with lifting.  Neurologic exam is normal.  Exam is stable.   Plan: 1.  Labs today: CBC with diff, CMP, CA27.29, SPEP, immunoglobulin levels. 2.  Continue Femara. 3.  Anticipate next mammogram on 03/11/2018. 4.  Follow up with Dr. Adonis Huguenin re: mammogram and ultrasound scheduled in 08/2017- done.  Confirmed next mammogram in 03/2018. 5.  RTC in 3 months for MD assessment and labs (CBC with diff, CMP, CA27.29).   Honor Loh, NP 07/16/2017, 10:19 AM   I saw and evaluated the patient, participating in the key portions of the service and reviewing pertinent diagnostic studies and records.  I reviewed the nurse practitioner's note and agree with the findings and the plan.  The assessment and plan were discussed with the patient.  A few questions were asked by the patient and answered.   Lequita Asal, MD 07/16/2017,2:34 PM

## 2017-07-16 ENCOUNTER — Inpatient Hospital Stay: Payer: Medicare Other | Attending: Hematology and Oncology

## 2017-07-16 ENCOUNTER — Encounter: Payer: Self-pay | Admitting: Hematology and Oncology

## 2017-07-16 ENCOUNTER — Telehealth: Payer: Self-pay | Admitting: *Deleted

## 2017-07-16 ENCOUNTER — Other Ambulatory Visit: Payer: Self-pay | Admitting: *Deleted

## 2017-07-16 ENCOUNTER — Ambulatory Visit (HOSPITAL_BASED_OUTPATIENT_CLINIC_OR_DEPARTMENT_OTHER): Payer: Medicare Other | Admitting: Hematology and Oncology

## 2017-07-16 ENCOUNTER — Telehealth: Payer: Self-pay

## 2017-07-16 VITALS — BP 138/71 | HR 60 | Temp 96.9°F | Resp 18 | Wt 172.4 lb

## 2017-07-16 DIAGNOSIS — I10 Essential (primary) hypertension: Secondary | ICD-10-CM

## 2017-07-16 DIAGNOSIS — K219 Gastro-esophageal reflux disease without esophagitis: Secondary | ICD-10-CM | POA: Diagnosis not present

## 2017-07-16 DIAGNOSIS — Z79811 Long term (current) use of aromatase inhibitors: Secondary | ICD-10-CM | POA: Diagnosis not present

## 2017-07-16 DIAGNOSIS — C50212 Malignant neoplasm of upper-inner quadrant of left female breast: Secondary | ICD-10-CM

## 2017-07-16 DIAGNOSIS — Z853 Personal history of malignant neoplasm of breast: Secondary | ICD-10-CM

## 2017-07-16 DIAGNOSIS — Z79899 Other long term (current) drug therapy: Secondary | ICD-10-CM | POA: Insufficient documentation

## 2017-07-16 DIAGNOSIS — Z7982 Long term (current) use of aspirin: Secondary | ICD-10-CM | POA: Insufficient documentation

## 2017-07-16 DIAGNOSIS — R531 Weakness: Secondary | ICD-10-CM | POA: Insufficient documentation

## 2017-07-16 DIAGNOSIS — Z808 Family history of malignant neoplasm of other organs or systems: Secondary | ICD-10-CM

## 2017-07-16 DIAGNOSIS — Z170 Estrogen receptor positive status [ER+]: Secondary | ICD-10-CM

## 2017-07-16 DIAGNOSIS — D472 Monoclonal gammopathy: Secondary | ICD-10-CM | POA: Diagnosis not present

## 2017-07-16 DIAGNOSIS — C50312 Malignant neoplasm of lower-inner quadrant of left female breast: Secondary | ICD-10-CM

## 2017-07-16 DIAGNOSIS — Z87891 Personal history of nicotine dependence: Secondary | ICD-10-CM | POA: Diagnosis not present

## 2017-07-16 DIAGNOSIS — E7800 Pure hypercholesterolemia, unspecified: Secondary | ICD-10-CM

## 2017-07-16 LAB — COMPREHENSIVE METABOLIC PANEL
ALT: 12 U/L — ABNORMAL LOW (ref 14–54)
AST: 18 U/L (ref 15–41)
Albumin: 3.6 g/dL (ref 3.5–5.0)
Alkaline Phosphatase: 102 U/L (ref 38–126)
Anion gap: 6 (ref 5–15)
BUN: <5 mg/dL — ABNORMAL LOW (ref 6–20)
CO2: 26 mmol/L (ref 22–32)
Calcium: 9 mg/dL (ref 8.9–10.3)
Chloride: 106 mmol/L (ref 101–111)
Creatinine, Ser: 1.36 mg/dL — ABNORMAL HIGH (ref 0.44–1.00)
GFR calc Af Amer: 41 mL/min — ABNORMAL LOW (ref >60)
GFR calc non Af Amer: 35 mL/min — ABNORMAL LOW (ref >60)
Glucose, Bld: 127 mg/dL — ABNORMAL HIGH (ref 65–99)
Potassium: 3.8 mmol/L (ref 3.5–5.1)
Sodium: 138 mmol/L (ref 135–145)
Total Bilirubin: 0.5 mg/dL (ref 0.3–1.2)
Total Protein: 7.7 g/dL (ref 6.5–8.1)

## 2017-07-16 LAB — CBC WITH DIFFERENTIAL/PLATELET
Basophils Absolute: 0 10*3/uL (ref 0–0.1)
Basophils Relative: 1 %
Eosinophils Absolute: 0 10*3/uL (ref 0–0.7)
Eosinophils Relative: 1 %
HCT: 34.7 % — ABNORMAL LOW (ref 35.0–47.0)
Hemoglobin: 11.4 g/dL — ABNORMAL LOW (ref 12.0–16.0)
Lymphocytes Relative: 20 %
Lymphs Abs: 1.3 10*3/uL (ref 1.0–3.6)
MCH: 30.3 pg (ref 26.0–34.0)
MCHC: 32.9 g/dL (ref 32.0–36.0)
MCV: 91.9 fL (ref 80.0–100.0)
Monocytes Absolute: 0.5 10*3/uL (ref 0.2–0.9)
Monocytes Relative: 8 %
Neutro Abs: 4.8 10*3/uL (ref 1.4–6.5)
Neutrophils Relative %: 72 %
Platelets: 217 10*3/uL (ref 150–440)
RBC: 3.77 MIL/uL — ABNORMAL LOW (ref 3.80–5.20)
RDW: 14.8 % — ABNORMAL HIGH (ref 11.5–14.5)
WBC: 6.8 10*3/uL (ref 3.6–11.0)

## 2017-07-16 NOTE — Telephone Encounter (Signed)
Spoke with patient at this time. She was instructed to keep her Mammogram appointment on 08/07/17 @ 11: 20 am.  As this will be her Bilateral Mammogram one year.  The mammogram she had done in May was a 6 month follow up from her Lumpectomy.  She is to follow up with Dr.Woodham 08/18/17 @ 9 am.

## 2017-07-16 NOTE — Progress Notes (Signed)
Patient offers no complaints today. 

## 2017-07-16 NOTE — Telephone Encounter (Signed)
Spoke with Angie @ Dr. Reginal Lutes office regarding patient's scheduled mammogram.  The mammogram should be in one year.  Last one in May of this year.  The one that was scheduled for next month is in error.  Angie will correct it and call the patient.

## 2017-07-17 LAB — PROTEIN ELECTROPHORESIS, SERUM
A/G Ratio: 1 (ref 0.7–1.7)
Albumin ELP: 3.6 g/dL (ref 2.9–4.4)
Alpha-1-Globulin: 0.2 g/dL (ref 0.0–0.4)
Alpha-2-Globulin: 0.7 g/dL (ref 0.4–1.0)
Beta Globulin: 1.2 g/dL (ref 0.7–1.3)
Gamma Globulin: 1.7 g/dL (ref 0.4–1.8)
Globulin, Total: 3.7 g/dL (ref 2.2–3.9)
M-Spike, %: 0.6 g/dL — ABNORMAL HIGH
Total Protein ELP: 7.3 g/dL (ref 6.0–8.5)

## 2017-07-17 LAB — IGG, IGA, IGM
IgA: 225 mg/dL (ref 64–422)
IgG (Immunoglobin G), Serum: 1926 mg/dL — ABNORMAL HIGH (ref 700–1600)
IgM (Immunoglobulin M), Srm: 42 mg/dL (ref 26–217)

## 2017-07-17 LAB — CANCER ANTIGEN 27.29: CA 27.29: 21.3 U/mL (ref 0.0–38.6)

## 2017-08-04 HISTORY — PX: BREAST BIOPSY: SHX20

## 2017-08-07 ENCOUNTER — Ambulatory Visit
Admission: RE | Admit: 2017-08-07 | Discharge: 2017-08-07 | Disposition: A | Discharge status: Home / Self Care | Payer: Medicare Other | Source: Ambulatory Visit | Attending: General Surgery | Admitting: General Surgery

## 2017-08-07 ENCOUNTER — Other Ambulatory Visit: Payer: Self-pay | Admitting: General Surgery

## 2017-08-07 DIAGNOSIS — Z853 Personal history of malignant neoplasm of breast: Secondary | ICD-10-CM

## 2017-08-07 DIAGNOSIS — Z9889 Other specified postprocedural states: Secondary | ICD-10-CM | POA: Diagnosis not present

## 2017-08-07 DIAGNOSIS — N6310 Unspecified lump in the right breast, unspecified quadrant: Secondary | ICD-10-CM

## 2017-08-07 DIAGNOSIS — R928 Other abnormal and inconclusive findings on diagnostic imaging of breast: Secondary | ICD-10-CM

## 2017-08-07 DIAGNOSIS — Z923 Personal history of irradiation: Secondary | ICD-10-CM | POA: Insufficient documentation

## 2017-08-14 ENCOUNTER — Ambulatory Visit
Admission: RE | Admit: 2017-08-14 | Discharge: 2017-08-14 | Disposition: A | Discharge status: Home / Self Care | Payer: Medicare Other | Source: Ambulatory Visit | Attending: General Surgery | Admitting: General Surgery

## 2017-08-14 DIAGNOSIS — R928 Other abnormal and inconclusive findings on diagnostic imaging of breast: Secondary | ICD-10-CM

## 2017-08-14 DIAGNOSIS — N6310 Unspecified lump in the right breast, unspecified quadrant: Secondary | ICD-10-CM

## 2017-08-14 DIAGNOSIS — N6312 Unspecified lump in the right breast, upper inner quadrant: Secondary | ICD-10-CM | POA: Insufficient documentation

## 2017-08-15 ENCOUNTER — Other Ambulatory Visit: Payer: Self-pay

## 2017-08-15 LAB — SURGICAL PATHOLOGY

## 2017-08-18 ENCOUNTER — Ambulatory Visit (INDEPENDENT_AMBULATORY_CARE_PROVIDER_SITE_OTHER): Payer: Medicare Other | Admitting: General Surgery

## 2017-08-18 ENCOUNTER — Encounter: Payer: Self-pay | Admitting: General Surgery

## 2017-08-18 VITALS — BP 98/60 | HR 67 | Temp 97.9°F | Ht 65.0 in | Wt 173.2 lb

## 2017-08-18 DIAGNOSIS — Z853 Personal history of malignant neoplasm of breast: Secondary | ICD-10-CM | POA: Diagnosis not present

## 2017-08-18 NOTE — Progress Notes (Signed)
Outpatient Surgical Follow Up  08/18/2017  Erica Jackson is an 83 y.o. female.   Chief Complaint  Patient presents with  . 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 She had a repeat imaging which showed a concerning feature in the opposite breast which was recently biopsied. Labs results of that returned benign. She reports no changes in self exams. She denies any fevers, chills, nausea, vomiting, chest pain, shortness of breath, diarrhea, constipation. She reports everything else is of her usual state and that she is doing very well.  Past Medical History:  Diagnosis Date  . Breast cancer ([place]) 2017   Lower inner quadrant left breast with lumpectomy and rad tx  . Cataract   . Dysrhythmia    no problems in a long time  . GERD (gastroesophageal reflux disease)   . Headache   . Hypercholesterolemia   . Hypertension   . Personal history of radiation therapy 11/2016   for left breast ca    Past Surgical History:  Procedure Laterality Date  . ABDOMINAL HYSTERECTOMY    . BREAST BIOPSY Left 08/28/2016   invasive mammary ca  . BREAST EXCISIONAL BIOPSY Left 09/17/2016   lt breast ca  . BREAST LUMPECTOMY Left 09/2016   invasive mammary carcinoma with clear margins and rad tx  . BREAST LUMPECTOMY WITH SENTINEL LYMPH NODE BIOPSY Left 09/17/2016   Procedure: BREAST LUMPECTOMY WITH SENTINEL LYMPH NODE BX;  Surgeon: Clayburn Pert, MD;  Location: [facility] ORS;  Service: General;  Laterality: Left;  . EYE SURGERY Left 2015    Family History  Problem Relation Age of Onset  . Stroke Mother   . Cancer Sister   . Cancer Brother   . Cancer Daughter   . Cancer Brother   . Cancer Brother   . Cancer Brother   . Cancer Brother   . Cancer Brother   . Breast cancer Neg Hx     Social History:  reports that she  quit smoking about 27 years ago. She smoked 1.00 pack per day. She quit smokeless tobacco use about 27 years ago. She reports that she does not drink alcohol or use drugs.  Allergies: No Known Allergies  Medications reviewed.    ROS A multipoint review of systems was completed, all pertinent positives and negatives are documented in the history of present illness and remainder are negative   BP 98/60   Pulse 67   Temp 97.9 F (36.6 C) (Oral)   Ht 5\' 5"  (1.651 m)   Wt 78.6 kg (173 lb 3.2 oz)   BMI 28.82 kg/m   Physical Exam Gen.: No acute distress Breast: Bilateral breast examined. Visible previous biopsy site in the right breast without any abnormal finding on exam of the right breast or axilla. Left breast prior lumpectomy site visualized that is healed well with no concerning features on exam to the breast or axilla. Chest: Clear to auscultation  heart: Regular rate and rhythm Abdomen: Soft and nontender    No results found for this or any previous visit (from the past 48 hour(s)). No results found.  Assessment/Plan:  1. History of breast cancer 83 year old female with a history of left-sided breast cancer. Doing very well. Discussed continuing self breast exams and to follow up immediately should she have a new finding. Otherwise she will  follow-up in clinic in 6 months with a repeat mammogram.  A total of 15 minutes was used on this encounter with greater than 50% of it used for counseling or coordination of care.     Clayburn Pert, MD FACS General Surgeon  08/18/2017,9:51 AM

## 2017-08-18 NOTE — Patient Instructions (Addendum)
We will see you back in 6 months for your mammogram and follow-up appointment with Dr. Adonis Huguenin  Please continue to do your monthly self exams and if you find a new lump or any concerns at all, call our office immediately.   Breast Self-Awareness Introduction Breast self-awareness means:  Knowing how your breasts look.  Knowing how your breasts feel.  Checking your breasts every month for changes.  Telling your doctor if you notice a change in your breasts. Breast self-awareness allows you to notice a breast problem early while it is still small. How to do a breast self-exam One way to learn what is normal for your breasts and to check for changes is to do a breast self-exam. To do a breast self-exam: Look for Changes  1. Take off all the clothes above your waist. 2. Stand in front of a mirror in a room with good lighting. 3. Put your hands on your hips. 4. Push your hands down. 5. Look at your breasts and nipples in the mirror to see if one breast or nipple looks different than the other. Check to see if:  The shape of one breast is different.  The size of one breast is different.  There are wrinkles, dips, and bumps in one breast and not the other. 6. Look at each breast for changes in your skin, such as:  Redness.  Scaly areas. 7. Look for changes in your nipples, such as:  Liquid around the nipples.  Bleeding.  Dimpling.  Redness.  A change in where the nipples are. Feel for Changes 1. Lie on your back on the floor. 2. Feel each breast. To do this, follow these steps:  Pick a breast to feel.  Put the arm closest to that breast above your head.  Use your other arm to feel the nipple area of your breast. Feel the area with the pads of your three middle fingers by making small circles with your fingers. For the first circle, press lightly. For the second circle, press harder. For the third circle, press even harder.  Keep making circles with your fingers at  the light, harder, and even harder pressures as you move down your breast. Stop when you feel your ribs.  Move your fingers a little toward the center of your body.  Start making circles with your fingers again, this time going up until you reach your collarbone.  Keep making up and down circles until you reach your armpit. Remember to keep using the three pressures.  Feel the other breast in the same way. 3. Sit or stand in the shower or tub. 4. With soapy water on your skin, feel each breast the same way you did in step 2, when you were lying on the floor. Write Down What You Find  After doing the self-exam, write down:  What is normal for each breast.  Any changes you find in each breast.  When you last had your period. How often should I check my breasts? Check your breasts every month. If you are breastfeeding, the best time to check them is after you feed your baby or after you use a breast pump. If you get periods, the best time to check your breasts is 5-7 days after your period is over. When should I see my doctor? See your doctor if you notice:  A change in shape or size of your breasts or nipples.  A change in the skin of your breast or nipples, such as  red or scaly skin.  Unusual fluid coming from your nipples.  A lump or thick area that was not there before.  Pain in your breasts.  Anything that concerns you. This information is not intended to replace advice given to you by your health care provider. Make sure you discuss any questions you have with your health care provider. Document Released: 04/08/2008 Document Revised: 03/28/2016 Document Reviewed: 09/10/2015  2017 Elsevier

## 2017-08-22 ENCOUNTER — Telehealth: Payer: Self-pay

## 2017-08-22 ENCOUNTER — Other Ambulatory Visit: Payer: Self-pay

## 2017-08-22 DIAGNOSIS — Z853 Personal history of malignant neoplasm of breast: Secondary | ICD-10-CM

## 2017-08-22 NOTE — Telephone Encounter (Signed)
Call made to [facility] at this time. Asked to scheduled a 6 month mammogram follow up for patient. Spoke with Roselyn Reef and she was able to get patient scheduled for 4/18 at 10:20AM. Will call patient and advise.   Call made to patient at this time advised her of her 6 month mammogram follow up on 4/18 at 10:20AM. I advised patient that I will contact her with her office visit follow up once our schedule allows. Patient verbalized understanding.

## 2017-09-14 ENCOUNTER — Other Ambulatory Visit: Payer: Self-pay | Admitting: Hematology and Oncology

## 2017-09-14 DIAGNOSIS — Z170 Estrogen receptor positive status [ER+]: Principal | ICD-10-CM

## 2017-09-14 DIAGNOSIS — C50212 Malignant neoplasm of upper-inner quadrant of left female breast: Secondary | ICD-10-CM

## 2017-10-15 ENCOUNTER — Other Ambulatory Visit: Payer: Self-pay | Admitting: Hematology and Oncology

## 2017-10-15 ENCOUNTER — Inpatient Hospital Stay: Payer: Medicare Other | Attending: Hematology and Oncology

## 2017-10-15 ENCOUNTER — Inpatient Hospital Stay (HOSPITAL_BASED_OUTPATIENT_CLINIC_OR_DEPARTMENT_OTHER): Payer: Medicare Other | Admitting: Hematology and Oncology

## 2017-10-15 VITALS — BP 136/75 | HR 67 | Temp 98.1°F | Resp 20 | Wt 174.4 lb

## 2017-10-15 DIAGNOSIS — Z923 Personal history of irradiation: Secondary | ICD-10-CM | POA: Diagnosis not present

## 2017-10-15 DIAGNOSIS — Z87891 Personal history of nicotine dependence: Secondary | ICD-10-CM | POA: Insufficient documentation

## 2017-10-15 DIAGNOSIS — Z808 Family history of malignant neoplasm of other organs or systems: Secondary | ICD-10-CM | POA: Diagnosis not present

## 2017-10-15 DIAGNOSIS — E7800 Pure hypercholesterolemia, unspecified: Secondary | ICD-10-CM | POA: Insufficient documentation

## 2017-10-15 DIAGNOSIS — I10 Essential (primary) hypertension: Secondary | ICD-10-CM | POA: Insufficient documentation

## 2017-10-15 DIAGNOSIS — Z853 Personal history of malignant neoplasm of breast: Secondary | ICD-10-CM

## 2017-10-15 DIAGNOSIS — C50212 Malignant neoplasm of upper-inner quadrant of left female breast: Secondary | ICD-10-CM

## 2017-10-15 DIAGNOSIS — Z7982 Long term (current) use of aspirin: Secondary | ICD-10-CM

## 2017-10-15 DIAGNOSIS — Z79818 Long term (current) use of other agents affecting estrogen receptors and estrogen levels: Secondary | ICD-10-CM

## 2017-10-15 DIAGNOSIS — D472 Monoclonal gammopathy: Secondary | ICD-10-CM

## 2017-10-15 DIAGNOSIS — M1990 Unspecified osteoarthritis, unspecified site: Secondary | ICD-10-CM | POA: Diagnosis not present

## 2017-10-15 DIAGNOSIS — R531 Weakness: Secondary | ICD-10-CM | POA: Insufficient documentation

## 2017-10-15 DIAGNOSIS — K219 Gastro-esophageal reflux disease without esophagitis: Secondary | ICD-10-CM

## 2017-10-15 DIAGNOSIS — C50512 Malignant neoplasm of lower-outer quadrant of left female breast: Secondary | ICD-10-CM | POA: Insufficient documentation

## 2017-10-15 DIAGNOSIS — Z79899 Other long term (current) drug therapy: Secondary | ICD-10-CM | POA: Insufficient documentation

## 2017-10-15 DIAGNOSIS — Z170 Estrogen receptor positive status [ER+]: Secondary | ICD-10-CM

## 2017-10-15 LAB — COMPREHENSIVE METABOLIC PANEL
ALT: 11 U/L — ABNORMAL LOW (ref 14–54)
AST: 20 U/L (ref 15–41)
Albumin: 3.6 g/dL (ref 3.5–5.0)
Alkaline Phosphatase: 97 U/L (ref 38–126)
Anion gap: 7 (ref 5–15)
BUN: 14 mg/dL (ref 6–20)
CO2: 22 mmol/L (ref 22–32)
Calcium: 8.9 mg/dL (ref 8.9–10.3)
Chloride: 108 mmol/L (ref 101–111)
Creatinine, Ser: 1.37 mg/dL — ABNORMAL HIGH (ref 0.44–1.00)
GFR calc Af Amer: 41 mL/min — ABNORMAL LOW (ref >60)
GFR calc non Af Amer: 35 mL/min — ABNORMAL LOW (ref >60)
Glucose, Bld: 145 mg/dL — ABNORMAL HIGH (ref 65–99)
Potassium: 3.6 mmol/L (ref 3.5–5.1)
Sodium: 137 mmol/L (ref 135–145)
Total Bilirubin: 0.2 mg/dL — ABNORMAL LOW (ref 0.3–1.2)
Total Protein: 7.9 g/dL (ref 6.5–8.1)

## 2017-10-15 LAB — CBC WITH DIFFERENTIAL/PLATELET
Basophils Absolute: 0 10*3/uL (ref 0–0.1)
Basophils Relative: 0 %
Eosinophils Absolute: 0 10*3/uL (ref 0–0.7)
Eosinophils Relative: 1 %
HCT: 34.6 % — ABNORMAL LOW (ref 35.0–47.0)
Hemoglobin: 11.3 g/dL — ABNORMAL LOW (ref 12.0–16.0)
Lymphocytes Relative: 24 %
Lymphs Abs: 1.5 10*3/uL (ref 1.0–3.6)
MCH: 30 pg (ref 26.0–34.0)
MCHC: 32.8 g/dL (ref 32.0–36.0)
MCV: 91.5 fL (ref 80.0–100.0)
Monocytes Absolute: 0.4 10*3/uL (ref 0.2–0.9)
Monocytes Relative: 7 %
Neutro Abs: 4.2 10*3/uL (ref 1.4–6.5)
Neutrophils Relative %: 68 %
Platelets: 217 10*3/uL (ref 150–440)
RBC: 3.78 MIL/uL — ABNORMAL LOW (ref 3.80–5.20)
RDW: 14.6 % — ABNORMAL HIGH (ref 11.5–14.5)
WBC: 6.2 10*3/uL (ref 3.6–11.0)

## 2017-10-15 NOTE — Progress Notes (Signed)
Patient c/o pain in her right wrist and left elbow.  Otherwise, no complaints today.

## 2017-10-15 NOTE — Progress Notes (Signed)
[facility] day:  10/15/2017  Chief Complaint: Erica Jackson is a 83 y.o. female with stage I left breast cancer and a monoclonal gammopathy of unknown significance (MGUS) who is seen for 3 month assessment on Femara.  HPI:  The patient was last seen in the medical oncology clinic on 07/16/2017.  At that time, she felt good.  She noted LEFT upper arm weakness with lifting.  Neurologic exam was normal.  Exam was stable.  Hematocrit was 34.7 with a hemoglobin of 11.4.  Creatinine was 1.36.  SPEP revealed 0.6 gm/dL monoclonal protein.  CA27.29 was normal.  During the interm, she notes arthritis in her right wrist and left elbow.  She denies any breast concerns.   Past Medical History:  Diagnosis Date  . Breast cancer ([place]) 2017   Lower inner quadrant left breast with lumpectomy and rad tx  . Cataract   . Dysrhythmia    no problems in a long time  . GERD (gastroesophageal reflux disease)   . Headache   . Hypercholesterolemia   . Hypertension   . Personal history of radiation therapy 11/2016   for left breast ca    Past Surgical History:  Procedure Laterality Date  . ABDOMINAL HYSTERECTOMY    . BREAST BIOPSY Left 08/28/2016   invasive mammary ca  . BREAST EXCISIONAL BIOPSY Left 09/17/2016   lt breast ca  . BREAST LUMPECTOMY Left 09/2016   invasive mammary carcinoma with clear margins and rad tx  . BREAST LUMPECTOMY WITH SENTINEL LYMPH NODE BIOPSY Left 09/17/2016   Procedure: BREAST LUMPECTOMY WITH SENTINEL LYMPH NODE BX;  Surgeon: Clayburn Pert, MD;  Location: [facility] ORS;  Service: General;  Laterality: Left;  . EYE SURGERY Left 2015    Family History  Problem Relation Age of Onset  . Stroke Mother   . Cancer Sister   . Cancer Brother   . Cancer Daughter   . Cancer Brother   . Cancer Brother   . Cancer Brother   . Cancer Brother   . Cancer Brother   . Breast cancer Neg Hx    The patient notes longevity in her family.  Her  aunt lived to 28.  Her great grandmother lived to 60.  Her sister has had multiple myeloma x 10 years.  Social History:  reports that she quit smoking about 28 years ago. She smoked 1.00 pack per day. She quit smokeless tobacco use about 28 years ago. She reports that she does not drink alcohol or use drugs.  The patient previously smoked 1 pack/week for 5-10 years.  She stopped smoking in 1990.  Her daughter, [name], is a Software engineer.  The patient is alone today.   Allergies: No Known Allergies  Current Medications: Current Outpatient Medications  Medication Sig Dispense Refill  . acetaminophen (TYLENOL) 650 MG CR tablet Take 1 tablet by mouth every 8 (eight) hours.    Marland Kitchen aspirin EC 81 MG tablet Take 81 mg by mouth daily.     Marland Kitchen atenolol (TENORMIN) 25 MG tablet TAKE ONE TABLET BY MOUTH ONCE DAILY    . cycloSPORINE (RESTASIS) 0.05 % ophthalmic emulsion Apply 1 drop to eye daily as needed.    . docusate sodium (COLACE) 100 MG capsule Take 1 capsule (100 mg total) by mouth 2 (two) times daily. 10 capsule 0  . felodipine (PLENDIL) 5 MG 24 hr tablet Take 5 mg by mouth daily.     . isosorbide dinitrate (ISORDIL) 20  MG tablet Take 20 mg by mouth 2 (two) times daily.     Marland Kitchen letrozole (FEMARA) 2.5 MG tablet TAKE 1 TABLET BY MOUTH ONCE DAILY 90 tablet 0  . lovastatin (MEVACOR) 10 MG tablet TAKE ONE TABLET BY MOUTH ONCE DAILY WITH DINNER    . omeprazole (PRILOSEC) 20 MG capsule Take 20 mg by mouth daily.     . potassium chloride SA (K-DUR,KLOR-CON) 20 MEQ tablet Take 20 mEq by mouth daily.      No current facility-administered medications for this visit.     Review of Systems:  GENERAL:  Feels "fairr".  No fevers or sweats.  Weight up 2 pounds.  PERFORMANCE STATUS (ECOG):  1 HEENT:  No visual changes, runny nose, sore throat, mouth sores or tenderness. Lungs: No shortness of breath or cough.  No hemoptysis. Cardiac:  No chest pain, palpitations, orthopnea, or PND. GI:  No nausea, vomiting,  diarrhea, constipation, melena or hematochezia.  Colonoscopy < 10 years ago.   GU:  No urgency, frequency, dysuria, or hematuria. Musculoskeletal:  Left arm "weak" (no change).  No back pain.  Arthritis in right wrist and left elbow.  No muscle tenderness. Extremities:  No pain or swelling in left arm. Skin:  No rashes or skin changes. Neuro:  Weakness in left arm when lifting heavy things.  No headache, focal numbness, balance or coordination issues. Endocrine:  No diabetes, thyroid issues, hot flashes or night sweats. Psych:  No mood changes, depression or anxiety. Pain:  No focal pain.  Review of systems:  All other systems reviewed and found to be negative.  Physical Exam: Blood pressure 136/75, pulse 67, temperature 98.1 F (36.7 C), temperature source Tympanic, resp. rate 20, weight 174 lb 6.1 oz (79.1 kg). GENERAL:  Well developed, well nourished, elderly woman sitting comfortably in the exam room in no acute distress. MENTAL STATUS:  Alert and oriented to person, place and time. HEAD:  Wearing a pink cap.  Gray hair.  Normocephalic, atraumatic, face symmetric, no Cushingoid features. EYES:  Glasses.  Brown eyes.  Amblyopia right eye.  Pupils equal round and reactive to light and accomodation.  No conjunctivitis or scleral icterus. ENT:  Oropharynx clear without lesion.  Tongue normal.  Upper and lower dentures.  Mucous membranes moist.  RESPIRATORY:  Clear to auscultation without rales, wheezes or rhonchi. CARDIOVASCULAR:  Regular rate and rhythm without murmur, rub or gallop. BREAST:  Right breast without masses, skin changes or nipple discharge.  Fibrocystic changes superiorly.  Left breast with superior arched incision with scarring superiorly.  Mild inferior edema.  Axillary scarring.  No skin changes or nipple discharge.  ABDOMEN:  Soft, non-tender, with active bowel sounds, and no hepatosplenomegaly.  No masses. SKIN:  No rashes, ulcers or lesions. EXTREMITIES: No edema, no skin  discoloration or tenderness.  No palpable cords. LYMPH NODES:  Left axillary ridge.  No palpable cervical, supraclavicular, axillary or inguinal adenopathy  NEUROLOGICAL:   Alert & oriented, motor strength 5/5 upper extremities; sensation intact.  PSYCH:  Appropriate   Appointment on 10/15/2017  Component Date Value Ref Range Status  . CA 27.29 10/15/2017 22.0  0.0 - 38.6 U/mL Final   Comment: (NOTE) [employer]/ACS methodology Performed At: [facility] [place], [place] [id number] Rush Farmer MD [id number]   . Sodium 10/15/2017 137  135 - 145 mmol/L Final  . Potassium 10/15/2017 3.6  3.5 - 5.1 mmol/L Final  . Chloride 10/15/2017 108  101 - 111 mmol/L Final  .  CO2 10/15/2017 22  22 - 32 mmol/L Final  . Glucose, Bld 10/15/2017 145* 65 - 99 mg/dL Final  . BUN 10/15/2017 14  6 - 20 mg/dL Final  . Creatinine, Ser 10/15/2017 1.37* 0.44 - 1.00 mg/dL Final  . Calcium 10/15/2017 8.9  8.9 - 10.3 mg/dL Final  . Total Protein 10/15/2017 7.9  6.5 - 8.1 g/dL Final  . Albumin 10/15/2017 3.6  3.5 - 5.0 g/dL Final  . AST 10/15/2017 20  15 - 41 U/L Final  . ALT 10/15/2017 11* 14 - 54 U/L Final  . Alkaline Phosphatase 10/15/2017 97  38 - 126 U/L Final  . Total Bilirubin 10/15/2017 0.2* 0.3 - 1.2 mg/dL Final  . GFR calc non Af Amer 10/15/2017 35* >60 mL/min Final  . GFR calc Af Amer 10/15/2017 41* >60 mL/min Final   Comment: (NOTE) The eGFR has been calculated using the CKD EPI equation. This calculation has not been validated in all clinical situations. eGFR's persistently <60 mL/min signify possible Chronic Kidney Disease.   . Anion gap 10/15/2017 7  5 - 15 Final  . WBC 10/15/2017 6.2  3.6 - 11.0 K/uL Final  . RBC 10/15/2017 3.78* 3.80 - 5.20 MIL/uL Final  . Hemoglobin 10/15/2017 11.3* 12.0 - 16.0 g/dL Final  . HCT 10/15/2017 34.6* 35.0 - 47.0 % Final  . MCV 10/15/2017 91.5  80.0 - 100.0 fL Final  . MCH 10/15/2017 30.0  26.0 - 34.0 pg Final  . MCHC 10/15/2017  32.8  32.0 - 36.0 g/dL Final  . RDW 10/15/2017 14.6* 11.5 - 14.5 % Final  . Platelets 10/15/2017 217  150 - 440 K/uL Final  . Neutrophils Relative % 10/15/2017 68  % Final  . Neutro Abs 10/15/2017 4.2  1.4 - 6.5 K/uL Final  . Lymphocytes Relative 10/15/2017 24  % Final  . Lymphs Abs 10/15/2017 1.5  1.0 - 3.6 K/uL Final  . Monocytes Relative 10/15/2017 7  % Final  . Monocytes Absolute 10/15/2017 0.4  0.2 - 0.9 K/uL Final  . Eosinophils Relative 10/15/2017 1  % Final  . Eosinophils Absolute 10/15/2017 0.0  0 - 0.7 K/uL Final  . Basophils Relative 10/15/2017 0  % Final  . Basophils Absolute 10/15/2017 0.0  0 - 0.1 K/uL Final    Assessment:  Erica Jackson is a 83 y.o. female with stage I left breast cancer s/p lumpectomy and sentinel lymph node biopsy on 09/17/2016.  Pathology revealed a 1.0 cm grade II invasive mammary carcinoma of no special type.  Margins were negative.  There was no tumor in 1 sentinel lymph node.  Pathologic stage was T1bN0M0.  Mammogram and ultrasound on 08/16/2016 revealed a 1.1 x 0.6 x 0.8 cm mass at the 10 o'clock position.  Left breast biopsy on 08/28/2016 revealed a 0.9 cm grade II invasive mammary carcinoma.  Tumor was ER positive (> 90%), PR negative, and Her2 2+ (negative by FISH).    She completed radiation on 11/28/2016.  She began Femara on 12/11/2016.  CA27.29 has been followed: 21.7 on 09/11/2016, 20.9 on 04/09/2017, 21.3 on 07/16/2017, and 22 on 10/15/2017.  Bilateral diagnostic mammogram on 03/11/2017 which revealed no evidence of malignancy s/p lumpectomy.  Bone density study on 09/25/2016 was normal with a T score of 0.4 in the left femoral neck.   She has a history of an elevated protein (8.3 on 09/11/2016) with a normal albumen.  SPEP on 12/11/2016 revealed a 0.4 gm/dL monoclonal protein.  She has mild renal insufficiency (  Cr 1.28; CrCl 45 ml/min).  Work-up on 12/18/2016 revealed an immunofixation with an IgG monoclonal protein with kappa light chain  specificity.  IgG was 2081.  Free light chain ratio was 1.28 (normal).  Beta 2 microglobulin was 2.4 (normal).  24 hour urine revealed no monoclonal protein or free light chains.  Bone survey on 01/08/2017 revealed no lytic lesions.  M-spike has been followed:  0.4 gm/dL on 12/11/2016 and 0.6 gm/dL on 07/16/2017.  IgG was 2041 on 12/18/2016 and 1926 on 07/16/2017.  Symptomatically, she feels "fair".  She notes arthritis in her right wrist and left elbow.  Neurologic exam is normal.  Exam is stable.   Plan: 1.  Labs today: CBC with diff, CMP, CA27.29. 2.  Continue Femara. 3.  Anticipate next mammogram on 03/11/2018. 4.  RTC in 3 months for MD assessment and labs (CBC with diff, CMP, CA27.29, SPEP).   Lequita Asal, MD 10/15/2017,3:35 PM

## 2017-10-16 LAB — CA 27.29 (SERIAL MONITOR): CA 27.29: 22 U/mL (ref 0.0–38.6)

## 2017-10-19 ENCOUNTER — Encounter: Payer: Self-pay | Admitting: Hematology and Oncology

## 2017-12-02 ENCOUNTER — Other Ambulatory Visit: Payer: Self-pay | Admitting: Hematology and Oncology

## 2017-12-02 DIAGNOSIS — Z170 Estrogen receptor positive status [ER+]: Principal | ICD-10-CM

## 2017-12-02 DIAGNOSIS — C50212 Malignant neoplasm of upper-inner quadrant of left female breast: Secondary | ICD-10-CM

## 2018-01-13 NOTE — Progress Notes (Addendum)
[place] [facility] day:  01/14/2018  Chief Complaint: Erica Jackson is a 84 y.o. female with stage I left breast cancer and a monoclonal gammopathy of unknown significance (MGUS) who is seen for 3 month assessment on Femara.    HPI:  The patient was last seen in the medical oncology clinic on 10/15/2017.  At that time, patient was doing well overall.  She complained of arthritis in her right wrist and left elbow.  She denied any breast concerns.  Exam was unremarkable. WBC 6.2 with ANC of 4200.  Hemoglobin 11.3, hematocrit 34.6, and platelets 217,000.  Continued elevation in her creatinine; result 1.37. CA27.29 stable at 22.0.  She continued Femara with no perceived side effects.  Patient is scheduled to have a  follow-up right diagnostic mammogram and breast ultrasound on 02/19/2018.   Symptomatically, patient continues to do well. She has no acute physical complaints today. Patient has chronic arthritic pain in her RIGHT wrist and LEFT elbow. She states, "It doesn't bother me too bad though". Pain is rated 7/10 today. Patient notes that she has been trying to exercise more.   Patient verbalizes no concerns with regards to her breasts. She has not experienced any B symptoms or recent infections. Patient continues on her Femara as prescribed. Patient performs monthly self breast examinations as recommended.   Patient eating well. Her weight remains stable.    Past Medical History:  Diagnosis Date  . Breast cancer ([place]) 2017   Lower inner quadrant left breast with lumpectomy and rad tx  . Cataract   . Dysrhythmia    no problems in a long time  . GERD (gastroesophageal reflux disease)   . Headache   . Hypercholesterolemia   . Hypertension   . Personal history of radiation therapy 11/2016   for left breast ca    Past Surgical History:  Procedure Laterality Date  . ABDOMINAL HYSTERECTOMY    . BREAST BIOPSY Left 08/28/2016   invasive mammary ca  .  BREAST EXCISIONAL BIOPSY Left 09/17/2016   lt breast ca  . BREAST LUMPECTOMY Left 09/2016   invasive mammary carcinoma with clear margins and rad tx  . BREAST LUMPECTOMY WITH SENTINEL LYMPH NODE BIOPSY Left 09/17/2016   Procedure: BREAST LUMPECTOMY WITH SENTINEL LYMPH NODE BX;  Surgeon: Clayburn Pert, MD;  Location: [facility] ORS;  Service: General;  Laterality: Left;  . EYE SURGERY Left 2015    Family History  Problem Relation Age of Onset  . Stroke Mother   . Cancer Sister   . Cancer Brother   . Cancer Daughter   . Cancer Brother   . Cancer Brother   . Cancer Brother   . Cancer Brother   . Cancer Brother   . Breast cancer Neg Hx    The patient notes longevity in her family.  Her aunt lived to 63.  Her great grandmother lived to 36.  Her sister has had multiple myeloma x 10 years.  Social History:  reports that she quit smoking about 28 years ago. She smoked 1.00 pack per day. She quit smokeless tobacco use about 28 years ago. She reports that she does not drink alcohol or use drugs.  The patient previously smoked 1 pack/week for 5-10 years.  She stopped smoking in 1990.  Her daughter, [name], is a Software engineer.  The patient is alone today.   Allergies: No Known Allergies  Current Medications: Current Outpatient Medications  Medication Sig Dispense Refill  . acetaminophen (  TYLENOL) 650 MG CR tablet Take 1 tablet by mouth every 8 (eight) hours.    Marland Kitchen aspirin EC 81 MG tablet Take 81 mg by mouth daily.     Marland Kitchen atenolol (TENORMIN) 25 MG tablet TAKE ONE TABLET BY MOUTH ONCE DAILY    . cycloSPORINE (RESTASIS) 0.05 % ophthalmic emulsion Apply 1 drop to eye daily as needed.    . docusate sodium (COLACE) 100 MG capsule Take 1 capsule (100 mg total) by mouth 2 (two) times daily. 10 capsule 0  . felodipine (PLENDIL) 5 MG 24 hr tablet Take 5 mg by mouth daily.     . isosorbide dinitrate (ISORDIL) 20 MG tablet Take 20 mg by mouth 2 (two) times daily.     Marland Kitchen letrozole (FEMARA) 2.5 MG tablet TAKE  1 TABLET BY MOUTH ONCE DAILY 90 tablet 0  . lovastatin (MEVACOR) 10 MG tablet TAKE ONE TABLET BY MOUTH ONCE DAILY WITH DINNER    . omeprazole (PRILOSEC) 20 MG capsule Take 20 mg by mouth daily.     . potassium chloride SA (K-DUR,KLOR-CON) 20 MEQ tablet Take 20 mEq by mouth daily.      No current facility-administered medications for this visit.     Review of Systems:  GENERAL:  Feels "ok".  No fevers or sweats.  Weight stable.  PERFORMANCE STATUS (ECOG):  1 HEENT:  No visual changes, runny nose, sore throat, mouth sores or tenderness. Lungs: No shortness of breath or cough.  No hemoptysis. Cardiac:  No chest pain, palpitations, orthopnea, or PND. GI:  No nausea, vomiting, diarrhea, constipation, melena or hematochezia.  Colonoscopy < 10 years ago.   GU:  No urgency, frequency, dysuria, or hematuria. Musculoskeletal:  Left arm "weak" (no change).  No back pain.  Arthritis in right wrist and left elbow. Muscle "soreness" in BUE secondary to recent exercise.  Extremities:  BUE "soreness".  No swelling in left arm. Skin:  No rashes or skin changes. Neuro:  Weakness in left arm when lifting heavy things.  No headache, focal numbness, balance or coordination issues. Endocrine:  No diabetes, thyroid issues, hot flashes or night sweats. Psych:  No mood changes, depression or anxiety. Pain:  7/10 - bilateral upper extremities (exercise induced) Review of systems:  All other systems reviewed and found to be negative.  Physical Exam: Blood pressure (!) 145/76, pulse (!) 59, temperature (!) 97.1 F (36.2 C), temperature source Tympanic, resp. rate 18, weight 174 lb 2.6 oz (79 kg). GENERAL:  Well developed, well nourished, elderly woman sitting comfortably in the exam room in no acute distress. MENTAL STATUS:  Alert and oriented to person, place and time. HEAD:  Pearline Cables hair.  Normocephalic, atraumatic, face symmetric, no Cushingoid features. EYES:  Glasses.  Brown eyes.  Amblyopia right eye.  Pupils  equal round and reactive to light and accomodation.  No conjunctivitis or scleral icterus. ENT:  Oropharynx clear without lesion.  Tongue normal.  Upper and lower dentures.  Mucous membranes moist.  RESPIRATORY:  Clear to auscultation without rales, wheezes or rhonchi. CARDIOVASCULAR:  Regular rate and rhythm without murmur, rub or gallop. BREAST:  Right breast without masses, skin changes or nipple discharge.  Fibrocystic changes superiorly.  Left breast with superior arched incision with scarring superiorly.  Mild inferior edema.  Axillary scarring.  No skin changes or nipple discharge.  ABDOMEN:  Soft, non-tender, with active bowel sounds, and no hepatosplenomegaly.  No masses. SKIN:  No rashes, ulcers or lesions. EXTREMITIES: No edema, no skin discoloration or  tenderness.  No palpable cords. LYMPH NODES:  Left axillary ridge.  No palpable cervical, supraclavicular, axillary or inguinal adenopathy  NEUROLOGICAL:   Alert & oriented, motor strength 5/5 upper extremities; sensation intact.  PSYCH:  Appropriate   Appointment on 01/14/2018  Component Date Value Ref Range Status  . Sodium 01/14/2018 136  135 - 145 mmol/L Final  . Potassium 01/14/2018 4.1  3.5 - 5.1 mmol/L Final  . Chloride 01/14/2018 102  101 - 111 mmol/L Final  . CO2 01/14/2018 26  22 - 32 mmol/L Final  . Glucose, Bld 01/14/2018 114* 65 - 99 mg/dL Final  . BUN 01/14/2018 18  6 - 20 mg/dL Final  . Creatinine, Ser 01/14/2018 1.34* 0.44 - 1.00 mg/dL Final  . Calcium 01/14/2018 9.1  8.9 - 10.3 mg/dL Final  . Total Protein 01/14/2018 8.4* 6.5 - 8.1 g/dL Final  . Albumin 01/14/2018 3.7  3.5 - 5.0 g/dL Final  . AST 01/14/2018 16  15 - 41 U/L Final  . ALT 01/14/2018 13* 14 - 54 U/L Final  . Alkaline Phosphatase 01/14/2018 113  38 - 126 U/L Final  . Total Bilirubin 01/14/2018 0.4  0.3 - 1.2 mg/dL Final  . GFR calc non Af Amer 01/14/2018 36* >60 mL/min Final  . GFR calc Af Amer 01/14/2018 42* >60 mL/min Final   Comment:  (NOTE) The eGFR has been calculated using the CKD EPI equation. This calculation has not been validated in all clinical situations. eGFR's persistently <60 mL/min signify possible Chronic Kidney Disease.   Erica Jackson gap 01/14/2018 8  5 - 15 Final   Performed at [facility], [address] [place], [place] [address]  . WBC 01/14/2018 6.6  3.6 - 11.0 K/uL Final  . RBC 01/14/2018 3.98  3.80 - 5.20 MIL/uL Final  . Hemoglobin 01/14/2018 11.9* 12.0 - 16.0 g/dL Final  . HCT 01/14/2018 35.8  35.0 - 47.0 % Final  . MCV 01/14/2018 90.1  80.0 - 100.0 fL Final  . MCH 01/14/2018 29.8  26.0 - 34.0 pg Final  . MCHC 01/14/2018 33.1  32.0 - 36.0 g/dL Final  . RDW 01/14/2018 14.8* 11.5 - 14.5 % Final  . Platelets 01/14/2018 225  150 - 440 K/uL Final  . Neutrophils Relative % 01/14/2018 75  % Final  . Neutro Abs 01/14/2018 4.9  1.4 - 6.5 K/uL Final  . Lymphocytes Relative 01/14/2018 18  % Final  . Lymphs Abs 01/14/2018 1.2  1.0 - 3.6 K/uL Final  . Monocytes Relative 01/14/2018 7  % Final  . Monocytes Absolute 01/14/2018 0.5  0.2 - 0.9 K/uL Final  . Eosinophils Relative 01/14/2018 1  % Final  . Eosinophils Absolute 01/14/2018 0.0  0 - 0.7 K/uL Final  . Basophils Relative 01/14/2018 1  % Final  . Basophils Absolute 01/14/2018 0.0  0 - 0.1 K/uL Final   Performed at [facility], [address] [place], [place] [address]    Assessment:  ROSANNA BICKLE is a 84 y.o. female with stage I left breast cancer s/p lumpectomy and sentinel lymph node biopsy on 09/17/2016.  Pathology revealed a 1.0 cm grade II invasive mammary carcinoma of no special type.  Margins were negative.  There was no tumor in 1 sentinel lymph node.  Pathologic stage was T1bN0M0.  Mammogram and ultrasound on 08/16/2016 revealed a 1.1 x 0.6 x 0.8 cm mass at the 10 o'clock position.  Left breast biopsy on 08/28/2016 revealed a 0.9 cm grade  II invasive mammary carcinoma.  Tumor was ER positive (> 90%), PR negative, and  Her2 2+ (negative by FISH).    She completed radiation on 11/28/2016.  She began Femara on 12/11/2016.  CA27.29 has been followed: 21.7 on 09/11/2016, 20.9 on 04/09/2017, 21.3 on 07/16/2017, and 22 on 10/15/2017.  Bilateral diagnostic mammogram on 03/11/2017 which revealed no evidence of malignancy s/p lumpectomy.  Bone density study on 09/25/2016 was normal with a T score of 0.4 in the left femoral neck.   She has a history of an elevated protein (8.3 on 09/11/2016) with a normal albumin.  SPEP on 12/11/2016 revealed a 0.4 gm/dL monoclonal protein.  She has mild renal insufficiency (Cr 1.28; CrCl 45 ml/min).  Work-up on 12/18/2016 revealed an immunofixation with an IgG monoclonal protein with kappa light chain specificity.  IgG was 2081.  Free light chain ratio was 1.28 (normal).  Beta 2 microglobulin was 2.4 (normal).  24 hour urine revealed no monoclonal protein or free light chains.  Bone survey on 01/08/2017 revealed no lytic lesions.  M-spike has been followed:  0.4 gm/dL on 12/11/2016 and 0.6 gm/dL on 07/16/2017.  IgG was 2041 on 12/18/2016 and 1926 on 07/16/2017.  Symptomatically, she feels "ok".  She notes arthritis in her right wrist and left elbow. Her arms are "sore" related to recent exercise.  Exam is stable. Hemoglobin 11.9, hematocrit 35.8, and platelets 225,000. BUN 18 with a creatinine 1.34 (CrCl 33.6 mL/min).  Plan: 1.  Labs today: CBC with diff, CMP, CA27.29, SPEP.  2.  Continue Femara. 3.  Continue to follow-up with surgery Adonis Huguenin) as previously scheduled.   4.  Mammogram and ultrasound have been scheduled for 02/19/2018 by Dr. Adonis Huguenin. 5.  RTC in 3 months for MD assessment and labs (CBC with diff, CMP, CA27.29, SPEP).   Honor Loh, NP 01/14/18, 9:48 AM

## 2018-01-14 ENCOUNTER — Inpatient Hospital Stay: Payer: Medicare Other

## 2018-01-14 ENCOUNTER — Inpatient Hospital Stay: Payer: Medicare Other | Attending: Hematology and Oncology | Admitting: Urgent Care

## 2018-01-14 VITALS — BP 145/76 | HR 59 | Temp 97.1°F | Resp 18 | Wt 174.2 lb

## 2018-01-14 DIAGNOSIS — M19031 Primary osteoarthritis, right wrist: Secondary | ICD-10-CM | POA: Diagnosis not present

## 2018-01-14 DIAGNOSIS — Z170 Estrogen receptor positive status [ER+]: Secondary | ICD-10-CM

## 2018-01-14 DIAGNOSIS — M19022 Primary osteoarthritis, left elbow: Secondary | ICD-10-CM

## 2018-01-14 DIAGNOSIS — C50212 Malignant neoplasm of upper-inner quadrant of left female breast: Secondary | ICD-10-CM

## 2018-01-14 DIAGNOSIS — D472 Monoclonal gammopathy: Secondary | ICD-10-CM

## 2018-01-14 LAB — CBC WITH DIFFERENTIAL/PLATELET
Basophils Absolute: 0 10*3/uL (ref 0–0.1)
Basophils Relative: 1 %
Eosinophils Absolute: 0 10*3/uL (ref 0–0.7)
Eosinophils Relative: 1 %
HCT: 35.8 % (ref 35.0–47.0)
Hemoglobin: 11.9 g/dL — ABNORMAL LOW (ref 12.0–16.0)
Lymphocytes Relative: 18 %
Lymphs Abs: 1.2 10*3/uL (ref 1.0–3.6)
MCH: 29.8 pg (ref 26.0–34.0)
MCHC: 33.1 g/dL (ref 32.0–36.0)
MCV: 90.1 fL (ref 80.0–100.0)
Monocytes Absolute: 0.5 10*3/uL (ref 0.2–0.9)
Monocytes Relative: 7 %
Neutro Abs: 4.9 10*3/uL (ref 1.4–6.5)
Neutrophils Relative %: 75 %
Platelets: 225 10*3/uL (ref 150–440)
RBC: 3.98 MIL/uL (ref 3.80–5.20)
RDW: 14.8 % — ABNORMAL HIGH (ref 11.5–14.5)
WBC: 6.6 10*3/uL (ref 3.6–11.0)

## 2018-01-14 LAB — COMPREHENSIVE METABOLIC PANEL
ALT: 13 U/L — ABNORMAL LOW (ref 14–54)
AST: 16 U/L (ref 15–41)
Albumin: 3.7 g/dL (ref 3.5–5.0)
Alkaline Phosphatase: 113 U/L (ref 38–126)
Anion gap: 8 (ref 5–15)
BUN: 18 mg/dL (ref 6–20)
CO2: 26 mmol/L (ref 22–32)
Calcium: 9.1 mg/dL (ref 8.9–10.3)
Chloride: 102 mmol/L (ref 101–111)
Creatinine, Ser: 1.34 mg/dL — ABNORMAL HIGH (ref 0.44–1.00)
GFR calc Af Amer: 42 mL/min — ABNORMAL LOW (ref >60)
GFR calc non Af Amer: 36 mL/min — ABNORMAL LOW (ref >60)
Glucose, Bld: 114 mg/dL — ABNORMAL HIGH (ref 65–99)
Potassium: 4.1 mmol/L (ref 3.5–5.1)
Sodium: 136 mmol/L (ref 135–145)
Total Bilirubin: 0.4 mg/dL (ref 0.3–1.2)
Total Protein: 8.4 g/dL — ABNORMAL HIGH (ref 6.5–8.1)

## 2018-01-14 NOTE — Progress Notes (Signed)
Patient states she has some exertional SOB.  Also c/o arms being sore.  States she has been exercising a little bit. Otherwise, no complaints.

## 2018-01-15 ENCOUNTER — Telehealth: Payer: Self-pay | Admitting: *Deleted

## 2018-01-15 LAB — PROTEIN ELECTROPHORESIS, SERUM
A/G Ratio: 0.9 (ref 0.7–1.7)
Albumin ELP: 3.5 g/dL (ref 2.9–4.4)
Alpha-1-Globulin: 0.2 g/dL (ref 0.0–0.4)
Alpha-2-Globulin: 0.8 g/dL (ref 0.4–1.0)
Beta Globulin: 1.2 g/dL (ref 0.7–1.3)
Gamma Globulin: 1.9 g/dL — ABNORMAL HIGH (ref 0.4–1.8)
Globulin, Total: 4.1 g/dL — ABNORMAL HIGH (ref 2.2–3.9)
M-Spike, %: 0.4 g/dL — ABNORMAL HIGH
Total Protein ELP: 7.6 g/dL (ref 6.0–8.5)

## 2018-01-15 LAB — CA 27.29 (SERIAL MONITOR): CA 27.29: 26.4 U/mL (ref 0.0–38.6)

## 2018-01-15 NOTE — Telephone Encounter (Signed)
Called patient and reviewed labwork.  Patient states she is feeling good.  She was outside, "working on her fishing rod".

## 2018-02-19 ENCOUNTER — Other Ambulatory Visit: Payer: Self-pay | Admitting: General Surgery

## 2018-02-19 ENCOUNTER — Telehealth: Payer: Self-pay

## 2018-02-19 ENCOUNTER — Ambulatory Visit
Admission: RE | Admit: 2018-02-19 | Discharge: 2018-02-19 | Disposition: A | Discharge status: Home / Self Care | Payer: Medicare Other | Source: Ambulatory Visit | Attending: General Surgery | Admitting: General Surgery

## 2018-02-19 DIAGNOSIS — N6312 Unspecified lump in the right breast, upper inner quadrant: Secondary | ICD-10-CM | POA: Insufficient documentation

## 2018-02-19 DIAGNOSIS — R928 Other abnormal and inconclusive findings on diagnostic imaging of breast: Secondary | ICD-10-CM

## 2018-02-19 DIAGNOSIS — Z853 Personal history of malignant neoplasm of breast: Secondary | ICD-10-CM | POA: Diagnosis not present

## 2018-02-19 DIAGNOSIS — N6310 Unspecified lump in the right breast, unspecified quadrant: Secondary | ICD-10-CM

## 2018-02-19 NOTE — Telephone Encounter (Signed)
Left message for patient to return call regarding 02/27/18 appointment with Dr.Davis @ 11: am to review mammogram.

## 2018-02-23 ENCOUNTER — Other Ambulatory Visit: Payer: Self-pay | Admitting: Surgery

## 2018-02-23 DIAGNOSIS — N6310 Unspecified lump in the right breast, unspecified quadrant: Secondary | ICD-10-CM

## 2018-02-23 DIAGNOSIS — R928 Other abnormal and inconclusive findings on diagnostic imaging of breast: Secondary | ICD-10-CM

## 2018-02-24 ENCOUNTER — Telehealth: Payer: Self-pay

## 2018-02-24 NOTE — Telephone Encounter (Signed)
Spoke with [name] patients daughter. Confirmed appointments below.  [facility] 02/27/18  Dr.Davis 03/09/18 @ 2 pm  [name] was asked to call Aldona Bar to confirm 02/27/18 appointment @ [phone].

## 2018-02-27 ENCOUNTER — Ambulatory Visit
Admission: RE | Admit: 2018-02-27 | Discharge: 2018-02-27 | Disposition: A | Discharge status: Home / Self Care | Payer: Medicare Other | Source: Ambulatory Visit | Attending: Surgery | Admitting: Surgery

## 2018-02-27 ENCOUNTER — Ambulatory Visit: Payer: Self-pay | Admitting: Surgery

## 2018-02-27 DIAGNOSIS — N6310 Unspecified lump in the right breast, unspecified quadrant: Secondary | ICD-10-CM

## 2018-02-27 DIAGNOSIS — R928 Other abnormal and inconclusive findings on diagnostic imaging of breast: Secondary | ICD-10-CM

## 2018-02-27 DIAGNOSIS — N6312 Unspecified lump in the right breast, upper inner quadrant: Secondary | ICD-10-CM | POA: Insufficient documentation

## 2018-03-02 LAB — SURGICAL PATHOLOGY

## 2018-03-04 ENCOUNTER — Telehealth: Payer: Self-pay

## 2018-03-04 HISTORY — PX: BREAST BIOPSY: SHX20

## 2018-03-04 NOTE — Telephone Encounter (Signed)
Patient's daughter Lynnda Shields called and wanted Breast biopsy results on mother. Per Dr.Pabon - negative for atypia or malignancy. Patient very happy. Reminded to keep f/u appointment with Dr.Davis 03/09/18.

## 2018-03-09 ENCOUNTER — Ambulatory Visit: Payer: Medicare Other | Admitting: Surgery

## 2018-03-09 ENCOUNTER — Encounter: Payer: Self-pay | Admitting: Surgery

## 2018-03-09 VITALS — BP 105/58 | HR 66 | Temp 97.7°F | Ht 62.0 in | Wt 170.0 lb

## 2018-03-09 DIAGNOSIS — Z853 Personal history of malignant neoplasm of breast: Secondary | ICD-10-CM | POA: Diagnosis not present

## 2018-03-09 NOTE — Patient Instructions (Signed)
We will call you with your appointment date and time for your mammogram and ultrasound. We will also give you your appointment with Dr. Rosana Hoes to go over results.

## 2018-03-09 NOTE — Progress Notes (Signed)
Surgical Clinic Progress/Follow-up Note   HPI:  84 y.o. 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 Patient reports occasional sharp Left axillary pain since her lumpectomy with sentinel lymph node biopsy, denies any appreciable breast masses, nipple discharge, fever/chills, N/V, weight loss/gain, CP, or SOB.  Review of Systems:  Constitutional: denies any other weight loss, fever, chills, or sweats  Eyes: denies any other vision changes, history of eye injury  ENT: denies sore throat, hearing problems  Respiratory: denies shortness of breath, wheezing  Cardiovascular: denies chest pain, palpitations  Gastrointestinal: denies abdominal pain, N/V, or diarrhea Musculoskeletal: denies any other joint pains or cramps  Skin: Denies any other rashes or skin discolorations  Neurological: denies any other headache, dizziness, weakness  Psychiatric: denies any other depression, anxiety  All other review of systems: otherwise negative   Vital Signs:  BP (!) 105/58   Pulse 66   Temp 97.7 F (36.5 C) (Oral)   Ht 5\' 2"  (1.575 m)   Wt 170 lb (77.1 kg)   BMI 31.09 kg/m    Physical Exam:  Constitutional:  -- Overweight non-obese body habitus  -- Awake, alert, and oriented x3  Eyes:  -- Pupils equally round and reactive to light  -- No scleral icterus  Ear, nose, throat:  -- No jugular venous distension  -- No nasal drainage, bleeding Pulmonary:  -- No crackles -- Equal breath sounds bilaterally -- Breathing non-labored at rest Cardiovascular:  -- S1, S2 present  -- No pericardial rubs Breasts: -- Easily palpable non-tender 2 cm firm mobile Right breast mass at ~12:30 o'clock location 3 cm from Right nipple with no appreciable nipple discharge or Right axillary lymphadenopathy --  Well-healed Left breast supra-areolar (mostly circumareolar) linear post-surgical incisional scar, non-tender without additionally palpable mass(es), nipple discharge, or lymphadenopathy with mild Left axillary tenderness to palpation  Gastrointestinal:  -- Soft, nontender, non-distended, no guarding/rebound  -- No abdominal masses appreciated, pulsatile or otherwise  Musculoskeletal / Integumentary:  -- Wounds or skin discoloration: None appreciated except as described above (breasts)  -- Extremities: B/L UE and LE FROM, hands and feet warm, no edema Neurologic:  -- Motor function: intact and symmetric  -- Sensation: intact and symmetric   Imaging:  Right Breast Ultrasound (02/19/2018) Mammographically, there are no new suspicious masses,  areas of architectural distortion or clustered microcalcifications. The previously seen mostly circumscribed subcentimeter circumscribed mass in the right breast upper slightly inner quadrant, middle depth is  redemonstrated. The nodule appears slightly irregular mammographically.  Post-biopsy marker is seen immediately adjacent to this mass.  Targeted ultrasound is performed, showing right breast 12:30  o'clock 3 cm from the nipple the slightly taller than wide mass with obscured borders which measures 0.6 x 0.7 x 0.7 cm. No evidence  of right axillary lymphadenopathy.  ADDENDUM: This [pathology] was found to be concordant with Dr. Kateri Plummer  impression and notes. Recommendation: Return to annual bilateral  diagnostic mammogram which is due in October 2019.  Pathology (02/27/2018): BREAST, RIGHT 12:30 3CMFN; ULTRASOUND-GUIDED BIOPSY:  - HYPOCELLULAR, CIRCUMSCRIBED HYALINIZED NODULE, SEE COMMENT.  - NEGATIVE FOR ATYPIA AND MALIGNANCY.  A hyalinized fibroadenoma is favored.  Assessment:  84 y.o. yo Female with a problem list including...  Patient Active Problem List   Diagnosis Date Noted  . History of breast cancer 12/19/2016  .  Monoclonal gammopathy  of unknown significance (MGUS) 12/11/2016  . Malignant neoplasm of upper-inner quadrant of left breast in female, estrogen receptor positive ([place])   . Breast cancer, left breast ([place]) 08/04/2016  . Chronic renal insufficiency 03/02/2014  . Coronary disease 03/02/2014  . DM2 (diabetes mellitus, type 2) ([place]) 03/02/2014  . GERD (gastroesophageal reflux disease) 03/02/2014  . HTN (hypertension) 03/02/2014  . 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  Plan:   - results of imaging and pathology discussed  - if any new/enlarged mass or concern similarly, consider excision vs re-biopsy  - considering pathology and imaging results, repeat bilateral diagnostic mammogram in 6 months, followed by annual imaging thereafter if no concerning results otherwise  - return to clinic in 6 months, instructed to call office if any questions or concerns  All of the above recommendations were discussed with the patient and patient's daughter, and all of patient's and family's questions were answered to their expressed satisfaction.  -- Marilynne Drivers Rosana Hoes, MD, [place]: [facility] Partnering for exceptional care. Office: [phone]

## 2018-03-13 ENCOUNTER — Other Ambulatory Visit: Payer: Self-pay | Admitting: Hematology and Oncology

## 2018-03-13 DIAGNOSIS — Z170 Estrogen receptor positive status [ER+]: Principal | ICD-10-CM

## 2018-03-13 DIAGNOSIS — C50212 Malignant neoplasm of upper-inner quadrant of left female breast: Secondary | ICD-10-CM

## 2018-04-15 ENCOUNTER — Inpatient Hospital Stay: Payer: Medicare Other | Attending: Hematology and Oncology | Admitting: Hematology and Oncology

## 2018-04-15 ENCOUNTER — Other Ambulatory Visit: Payer: Self-pay

## 2018-04-15 ENCOUNTER — Inpatient Hospital Stay: Payer: Medicare Other

## 2018-04-15 ENCOUNTER — Encounter: Payer: Self-pay | Admitting: Hematology and Oncology

## 2018-04-15 VITALS — BP 126/65 | HR 57 | Temp 97.6°F | Resp 20 | Ht 62.0 in | Wt 167.5 lb

## 2018-04-15 DIAGNOSIS — D472 Monoclonal gammopathy: Secondary | ICD-10-CM | POA: Diagnosis not present

## 2018-04-15 DIAGNOSIS — Z170 Estrogen receptor positive status [ER+]: Principal | ICD-10-CM

## 2018-04-15 DIAGNOSIS — M19022 Primary osteoarthritis, left elbow: Secondary | ICD-10-CM | POA: Diagnosis not present

## 2018-04-15 DIAGNOSIS — C50212 Malignant neoplasm of upper-inner quadrant of left female breast: Secondary | ICD-10-CM | POA: Insufficient documentation

## 2018-04-15 DIAGNOSIS — M19031 Primary osteoarthritis, right wrist: Secondary | ICD-10-CM

## 2018-04-15 DIAGNOSIS — Z79811 Long term (current) use of aromatase inhibitors: Secondary | ICD-10-CM | POA: Diagnosis not present

## 2018-04-15 DIAGNOSIS — N289 Disorder of kidney and ureter, unspecified: Secondary | ICD-10-CM | POA: Insufficient documentation

## 2018-04-15 DIAGNOSIS — Z923 Personal history of irradiation: Secondary | ICD-10-CM | POA: Diagnosis not present

## 2018-04-15 DIAGNOSIS — Z87891 Personal history of nicotine dependence: Secondary | ICD-10-CM | POA: Diagnosis not present

## 2018-04-15 LAB — CBC WITH DIFFERENTIAL/PLATELET
BASOS PCT: 1 %
Basophils Absolute: 0.1 10*3/uL (ref 0–0.1)
Eosinophils Absolute: 0 10*3/uL (ref 0–0.7)
Eosinophils Relative: 1 %
HCT: 36.6 % (ref 35.0–47.0)
Hemoglobin: 12.1 g/dL (ref 12.0–16.0)
Lymphocytes Relative: 19 %
Lymphs Abs: 1.3 10*3/uL (ref 1.0–3.6)
MCH: 29.4 pg (ref 26.0–34.0)
MCHC: 32.9 g/dL (ref 32.0–36.0)
MCV: 89.5 fL (ref 80.0–100.0)
Monocytes Absolute: 0.4 10*3/uL (ref 0.2–0.9)
Monocytes Relative: 7 %
Neutro Abs: 4.9 10*3/uL (ref 1.4–6.5)
Neutrophils Relative %: 72 %
Platelets: 235 10*3/uL (ref 150–440)
RBC: 4.1 MIL/uL (ref 3.80–5.20)
RDW: 15 % — AB (ref 11.5–14.5)
WBC: 6.7 10*3/uL (ref 3.6–11.0)

## 2018-04-15 LAB — COMPREHENSIVE METABOLIC PANEL
ALT: 12 U/L — ABNORMAL LOW (ref 14–54)
AST: 18 U/L (ref 15–41)
Albumin: 3.8 g/dL (ref 3.5–5.0)
Alkaline Phosphatase: 108 U/L (ref 38–126)
Anion gap: 10 (ref 5–15)
BUN: 15 mg/dL (ref 6–20)
CALCIUM: 9.3 mg/dL (ref 8.9–10.3)
CO2: 23 mmol/L (ref 22–32)
Chloride: 106 mmol/L (ref 101–111)
Creatinine, Ser: 1.36 mg/dL — ABNORMAL HIGH (ref 0.44–1.00)
GFR calc Af Amer: 41 mL/min — ABNORMAL LOW (ref >60)
GFR calc non Af Amer: 35 mL/min — ABNORMAL LOW (ref >60)
GLUCOSE: 102 mg/dL — AB (ref 65–99)
Potassium: 3.8 mmol/L (ref 3.5–5.1)
SODIUM: 139 mmol/L (ref 135–145)
Total Bilirubin: 0.5 mg/dL (ref 0.3–1.2)
Total Protein: 8.4 g/dL — ABNORMAL HIGH (ref 6.5–8.1)

## 2018-04-15 NOTE — Progress Notes (Signed)
[facility] day:  04/15/2018   Chief Complaint: Erica Jackson is a 84 y.o. female with stage I left breast cancer and a monoclonal gammopathy of unknown significance (MGUS) who is seen for 3 month assessment on Femara.    HPI:  The patient was last seen in the medical oncology clinic by Honor Loh, NP on 01/14/2018.  At that time,  she felt "ok".  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 Her arms were "sore" related to recent exercise.  Exam was stable. Hemoglobin  Was 11.9, hematocrit 35.8, and platelets 225,000. BUN was 18 with a creatinine 1.34 (CrCl 33.6 mL/min).  Right sided mammogram and ultrasound on 02/19/2018 revealed a 0.6 x 0.7 x 0.7 cm mass at the 12:30 position 3 cm from the nipple.  Ultrasound guided biopsy recommended.  Right breast biopsy and clip placement on 02/27/2018 revealed a hypocellular, circumscribed hyalanized nodule.  There was no atypia or malignancy.  She was seen by Dr. Tama High on 03/09/2018.  She noted occasional sharp left axillary pain since her lumpectomy.  Exam revealed an easily palpable non-tender firm mobile right breast mass.  Pathology was reviewed.  She was scheduled for 6 month follow-up mammogram.  During the interim, patient continues to do well. She has no acute complaints. Patient denies that she has experienced any B symptoms. She denies any interval infections. Patient does not verbalize any concerns with regards to her breasts today. Patient  does perform monthly self breast examinations as recommended. Patient continues to have pain in her LEFT axilla following her surgery.  Patient maintains an adequate appetite, and notes that she is eating well. Weight, compared to her last visit to the clinic, has decreased by 7 pounds.   Patient denies pain in the clinic today.   Past Medical History:  Diagnosis Date  . Breast cancer ([place]) 2017   Lower inner quadrant left breast with lumpectomy  and rad tx  . Breast cancer, left breast ([place]) 08/04/2016   Overview:  0.9 cm grade II invasive mammary carcinoma. Tumor was ER positive (> 90%), PR negative, and Her2 2+ (FISH negative)  . Cataract   . Dysrhythmia    no problems in a long time  . GERD (gastroesophageal reflux disease)   . Headache   . Hypercholesterolemia   . Hypertension   . Personal history of radiation therapy 11/2016   for left breast ca    Past Surgical History:  Procedure Laterality Date  . ABDOMINAL HYSTERECTOMY    . BREAST BIOPSY Left 08/28/2016   invasive mammary ca  . BREAST BIOPSY Right 2018  . BREAST EXCISIONAL BIOPSY Left 09/17/2016   lt breast ca  . BREAST LUMPECTOMY Left 09/2016   invasive mammary carcinoma with clear margins and rad tx  . BREAST LUMPECTOMY WITH SENTINEL LYMPH NODE BIOPSY Left 09/17/2016   Procedure: BREAST LUMPECTOMY WITH SENTINEL LYMPH NODE BX;  Surgeon: Clayburn Pert, MD;  Location: [facility] ORS;  Service: General;  Laterality: Left;  . EYE SURGERY Left 2015    Family History  Problem Relation Age of Onset  . Stroke Mother   . Cancer Sister   . Cancer Brother   . Cancer Daughter   . Cancer Brother   . Cancer Brother   . Cancer Brother   . Cancer Brother   . Cancer Brother   . Breast cancer Neg Hx    The patient notes longevity in her family.  Her aunt lived to 63.  Her great grandmother lived to 56.  Her sister has had multiple myeloma x 10 years.  Social History:  reports that she quit smoking about 28 years ago. She smoked 1.00 pack per day. She quit smokeless tobacco use about 28 years ago. She reports that she does not drink alcohol or use drugs.  The patient previously smoked 1 pack/week for 5-10 years.  She stopped smoking in 1990.  Her daughter, [name], is a Software engineer.  The patient is alone today.   Allergies: No Known Allergies  Current Medications: Current Outpatient Medications  Medication Sig Dispense Refill  . acetaminophen (TYLENOL) 650 MG CR tablet  Take 1 tablet by mouth every 8 (eight) hours.    Marland Kitchen aspirin EC 81 MG tablet Take 81 mg by mouth daily.     Marland Kitchen atenolol (TENORMIN) 25 MG tablet TAKE ONE TABLET BY MOUTH ONCE DAILY    . felodipine (PLENDIL) 5 MG 24 hr tablet Take 5 mg by mouth daily.     . isosorbide dinitrate (ISORDIL) 20 MG tablet Take 20 mg by mouth 2 (two) times daily.     Marland Kitchen letrozole (FEMARA) 2.5 MG tablet TAKE 1 TABLET BY MOUTH ONCE DAILY 90 tablet 0  . lovastatin (MEVACOR) 10 MG tablet TAKE ONE TABLET BY MOUTH ONCE DAILY WITH DINNER    . omeprazole (PRILOSEC) 20 MG capsule Take 20 mg by mouth daily.     . potassium chloride SA (K-DUR,KLOR-CON) 20 MEQ tablet Take 20 mEq by mouth daily.      No current facility-administered medications for this visit.     Review of Systems  Constitutional: Positive for weight loss (down 7 pounds). Negative for diaphoresis, fever and malaise/fatigue.       "I feel pretty good".  No problems.  HENT: Negative.   Eyes: Negative.   Respiratory: Negative for cough, hemoptysis, sputum production and shortness of breath.   Cardiovascular: Negative for chest pain, palpitations, orthopnea, leg swelling and PND.  Gastrointestinal: Negative for abdominal pain, blood in stool, constipation, diarrhea, melena, nausea and vomiting.  Genitourinary: Negative for dysuria, frequency, hematuria and urgency.  Musculoskeletal: Positive for joint pain (RIGHT wrist and LEFT elbow). Negative for back pain, falls and myalgias.       Pain in LEFT axilla s/p lumpectomy. LEFT upper extremity weakness (stable).   Skin: Negative for itching and rash.  Neurological: Negative for dizziness, tremors, weakness and headaches.  Endo/Heme/Allergies: Does not bruise/bleed easily.  Psychiatric/Behavioral: Negative for depression, memory loss and suicidal ideas. The patient is not nervous/anxious and does not have insomnia.   All other systems reviewed and are negative.  Performance status (ECOG): 1 - Symptomatic but  completely ambulatory  Physical Exam: Blood pressure 126/65, pulse (!) 57, temperature 97.6 F (36.4 C), temperature source Tympanic, resp. rate 20, height _0  (1.575 m), weight 167 lb 8.8 oz (76 kg). GENERAL:  Well developed, well nourished, elderly woman sitting comfortably in the exam room in no acute distress. MENTAL STATUS:  Alert and oriented to person, place and time. HEAD:  Pearline Cables hair pulled up.  Normocephalic, atraumatic, face symmetric, no Cushingoid features. EYES:  Glasses.  Brown eyes.  Amblyopia right eye.  Pupils equal round and reactive to light and accomodation.  No conjunctivitis or scleral icterus. ENT:  Oropharynx clear without lesion.  Tongue normal.  Dentures.  Mucous membranes moist.  RESPIRATORY:  Clear to auscultation without rales, wheezes or rhonchi. CARDIOVASCULAR:  Regular rate and rhythm without murmur,  rub or gallop. BREAST:  Right breast without masses, skin changes or nipple discharge.  Fibrocystic changes.  Fingertip nodule at 12:30 position in right breast.  Left breast with inferior edema. No masses, skin changes or nipple discharge. ABDOMEN:  Soft, non-tender, with active bowel sounds, and no hepatosplenomegaly.  No masses. SKIN:  No rashes, ulcers or lesions. EXTREMITIES: No edema, no skin discoloration or tenderness.  No palpable cords. LYMPH NODES: No palpable cervical, supraclavicular, axillary or inguinal adenopathy  NEUROLOGICAL: Unremarkable.  Upper extremity strength symmetric. PSYCH:  Appropriate.    Appointment on 04/15/2018  Component Date Value Ref Range Status  . Sodium 04/15/2018 139  135 - 145 mmol/L Final  . Potassium 04/15/2018 3.8  3.5 - 5.1 mmol/L Final  . Chloride 04/15/2018 106  101 - 111 mmol/L Final  . CO2 04/15/2018 23  22 - 32 mmol/L Final  . Glucose, Bld 04/15/2018 102* 65 - 99 mg/dL Final  . BUN 04/15/2018 15  6 - 20 mg/dL Final  . Creatinine, Ser 04/15/2018 1.36* 0.44 - 1.00 mg/dL Final  . Calcium 04/15/2018 9.3  8.9 -  10.3 mg/dL Final  . Total Protein 04/15/2018 8.4* 6.5 - 8.1 g/dL Final  . Albumin 04/15/2018 3.8  3.5 - 5.0 g/dL Final  . AST 04/15/2018 18  15 - 41 U/L Final  . ALT 04/15/2018 12* 14 - 54 U/L Final  . Alkaline Phosphatase 04/15/2018 108  38 - 126 U/L Final  . Total Bilirubin 04/15/2018 0.5  0.3 - 1.2 mg/dL Final  . GFR calc non Af Amer 04/15/2018 35* >60 mL/min Final  . GFR calc Af Amer 04/15/2018 41* >60 mL/min Final   Comment: (NOTE) The eGFR has been calculated using the CKD EPI equation. This calculation has not been validated in all clinical situations. eGFR's persistently <60 mL/min signify possible Chronic Kidney Disease.   Georgiann Hahn gap 04/15/2018 10  5 - 15 Final   Performed at [facility], [address] [place], [place] [address]  . WBC 04/15/2018 6.7  3.6 - 11.0 K/uL Final  . RBC 04/15/2018 4.10  3.80 - 5.20 MIL/uL Final  . Hemoglobin 04/15/2018 12.1  12.0 - 16.0 g/dL Final  . HCT 04/15/2018 36.6  35.0 - 47.0 % Final  . MCV 04/15/2018 89.5  80.0 - 100.0 fL Final  . MCH 04/15/2018 29.4  26.0 - 34.0 pg Final  . MCHC 04/15/2018 32.9  32.0 - 36.0 g/dL Final  . RDW 04/15/2018 15.0* 11.5 - 14.5 % Final  . Platelets 04/15/2018 235  150 - 440 K/uL Final  . Neutrophils Relative % 04/15/2018 72  % Final  . Neutro Abs 04/15/2018 4.9  1.4 - 6.5 K/uL Final  . Lymphocytes Relative 04/15/2018 19  % Final  . Lymphs Abs 04/15/2018 1.3  1.0 - 3.6 K/uL Final  . Monocytes Relative 04/15/2018 7  % Final  . Monocytes Absolute 04/15/2018 0.4  0.2 - 0.9 K/uL Final  . Eosinophils Relative 04/15/2018 1  % Final  . Eosinophils Absolute 04/15/2018 0.0  0 - 0.7 K/uL Final  . Basophils Relative 04/15/2018 1  % Final  . Basophils Absolute 04/15/2018 0.1  0 - 0.1 K/uL Final   Performed at [facility], [address], [place], [place] [address]    Assessment:  Erica Jackson is a 84 y.o. female with stage I left breast cancer s/p lumpectomy and sentinel lymph node biopsy  on 09/17/2016.  Pathology revealed a 1.0 cm grade  II invasive mammary carcinoma of no special type.  Margins were negative.  There was no tumor in 1 sentinel lymph node.  Pathologic stage was [id number].  Mammogram and ultrasound on 08/16/2016 revealed a 1.1 x 0.6 x 0.8 cm mass at the 10 o'clock position.  Left breast biopsy on 08/28/2016 revealed a 0.9 cm grade II invasive mammary carcinoma.  Tumor was ER positive (> 90%), PR negative, and Her2 2+ (negative by FISH).    She completed radiation on 11/28/2016.  She began Femara on 12/11/2016.  CA27.29 has been followed: 21.7 on 09/11/2016, 20.9 on 04/09/2017, 21.3 on 07/16/2017, 22 on 10/15/2017, 26.4 on 01/14/2018, and 24.1 on 04/15/2018.  Bilateral diagnostic mammogram on 03/11/2017 which revealed no evidence of malignancy s/p lumpectomy.    Right sided mammogram and ultrasound on 02/19/2018 revealed a 0.6 x 0.7 x 0.7 cm mass at the 12:30 position 3 cm from the nipple.  Ultrasound guided biopsy right breast biopsy and clip placement on 02/27/2018 revealed a hypocellular, circumscribed hyalanized nodule with no atypia or malignancy.  Bone density study on 09/25/2016 was normal with a T score of 0.4 in the left femoral neck.   She has a history of an elevated protein (8.3 on 09/11/2016) with a normal albumin.  SPEP on 12/11/2016 revealed a 0.4 gm/dL monoclonal protein.  She has mild renal insufficiency (Cr 1.28; CrCl 45 ml/min).  Work-up on 12/18/2016 revealed an immunofixation with an IgG monoclonal protein with kappa light chain specificity.  IgG was 2081.  Free light chain ratio was 1.28 (normal).  Beta 2 microglobulin was 2.4 (normal).  24 hour urine revealed no monoclonal protein or free light chains.  Bone survey on 01/08/2017 revealed no lytic lesions.  M-spike has been followed (gm/dL):  0.4 on 12/11/2016, 0.6 on 07/16/2017, and 0.4 on 01/14/2018.  IgG was 2041 on 12/18/2016 and 1926 on 07/16/2017.  Symptomatically, she feels "good".  Exam  reveals a palpable right breast nodule (biopsy negative).  Hemoglobin 12.1, hematocrit 36.6, and platelets 235,000.  Creatinine is 1.36.  Plan: 1.  Labs today: CBC with diff, CMP, CA27.29. 2.  Review interval mammogram and biopsy. 3.  Discuss at next tumor board.  4.  Continue Femara. 5.  Anticipate next mammogram on 08/21/2018. 6.  Schedule bone density on 09/25/2018. 7.  RTC in 4 months for MD assessment and labs (CBC with diff, CMP, CA27.29, SPEP).  Addendum:  Right breast biopsy and mammogram findings are concordant.   Honor Loh, NP 04/15/18, 10:42 AM   I saw and evaluated the patient, participating in the key portions of the service and reviewing pertinent diagnostic studies and records.  I reviewed the nurse practitioner's note and agree with the findings and the plan.  The assessment and plan were discussed with the patient.  Multiple questions were asked by the patient and answered.   Nolon Stalls, MD 04/15/2018,10:42 AM

## 2018-04-16 LAB — CA 27.29 (SERIAL MONITOR): CA 27.29: 24.1 U/mL (ref 0.0–38.6)

## 2018-06-14 ENCOUNTER — Other Ambulatory Visit: Payer: Self-pay | Admitting: Hematology and Oncology

## 2018-06-14 DIAGNOSIS — C50212 Malignant neoplasm of upper-inner quadrant of left female breast: Secondary | ICD-10-CM

## 2018-06-14 DIAGNOSIS — Z170 Estrogen receptor positive status [ER+]: Principal | ICD-10-CM

## 2018-07-01 ENCOUNTER — Encounter: Payer: Self-pay | Admitting: Radiation Oncology

## 2018-07-01 ENCOUNTER — Ambulatory Visit
Admission: RE | Admit: 2018-07-01 | Discharge: 2018-07-01 | Disposition: A | Discharge status: Home / Self Care | Payer: Medicare Other | Source: Ambulatory Visit | Attending: Radiation Oncology | Admitting: Radiation Oncology

## 2018-07-01 ENCOUNTER — Other Ambulatory Visit: Payer: Self-pay

## 2018-07-01 VITALS — BP 124/68 | HR 59 | Wt 163.7 lb

## 2018-07-01 DIAGNOSIS — Z170 Estrogen receptor positive status [ER+]: Secondary | ICD-10-CM | POA: Diagnosis not present

## 2018-07-01 DIAGNOSIS — Z79811 Long term (current) use of aromatase inhibitors: Secondary | ICD-10-CM | POA: Insufficient documentation

## 2018-07-01 DIAGNOSIS — Z923 Personal history of irradiation: Secondary | ICD-10-CM | POA: Diagnosis not present

## 2018-07-01 DIAGNOSIS — C50312 Malignant neoplasm of lower-inner quadrant of left female breast: Secondary | ICD-10-CM | POA: Insufficient documentation

## 2018-07-01 NOTE — Progress Notes (Signed)
Radiation Oncology Follow up Note  Name: Erica Jackson   Date:   07/01/2018 MRN:  [id number] DOB: 12/08/1933    This 84 y.o. female presents to the clinic today for one half year follow-up status post whole breast radiation to her left breast for stage I invasive mammary carcinoma.  REFERRING PROVIDER: Sherrin Daisy, MD  HPI: patient is an 84 year old female now seen out 1-1/2 years having completed whole breast radiation to her left breast for stage I ER positive PR negative invasive mammary carcinoma. She is seen today in routine follow-up and is doing well. She specifically denies breast tenderness cough or bone pain..she is currently on Femara tolerating that well without side effect.follow-up mammograms which I have reviewed showed no evidence of change or malignancy in the left breast. She did have a small lesion noted in her right breast which underwent ultrasound guided biopsy which was negative for malignancy or atypia.  COMPLICATIONS OF TREATMENT: none  FOLLOW UP COMPLIANCE: keeps appointments   PHYSICAL EXAM:  BP 124/68   Pulse (!) 59   Wt 163 lb 11.1 oz (74.2 kg)   BMI 29.94 kg/m  Lungs are clear to A&P cardiac examination essentially unremarkable with regular rate and rhythm. No dominant mass or nodularity is noted in either breast in 2 positions examined. Incision is well-healed. No axillary or supraclavicular adenopathy is appreciated. Cosmetic result is excellent.Well-developed well-nourished patient in NAD. HEENT reveals PERLA, EOMI, discs not visualized.  Oral cavity is clear. No oral mucosal lesions are identified. Neck is clear without evidence of cervical or supraclavicular adenopathy. Lungs are clear to A&P. Cardiac examination is essentially unremarkable with regular rate and rhythm without murmur rub or thrill. Abdomen is benign with no organomegaly or masses noted. Motor sensory and DTR levels are equal and symmetric in the upper and lower extremities. Cranial nerves  II through XII are grossly intact. Proprioception is intact. No peripheral adenopathy or edema is identified. No motor or sensory levels are noted. Crude visual fields are within normal range.  RADIOLOGY RESULTS: mammograms and ultrasound reviewed  PLAN: the present time patient continues to do well with no evidence of disease. She continues follow-up mammograms. She continues on Femara without side effect. I'm please were overall progress. I've asked to see her back in 1 year for follow-up. Patient knows to call with any concerns.  I would like to take this opportunity to thank you for allowing me to participate in the care of your patient.Noreene Filbert, MD

## 2018-07-13 ENCOUNTER — Other Ambulatory Visit: Payer: Self-pay

## 2018-07-13 DIAGNOSIS — C50912 Malignant neoplasm of unspecified site of left female breast: Secondary | ICD-10-CM

## 2018-07-13 DIAGNOSIS — Z170 Estrogen receptor positive status [ER+]: Principal | ICD-10-CM

## 2018-07-13 DIAGNOSIS — C50312 Malignant neoplasm of lower-inner quadrant of left female breast: Secondary | ICD-10-CM

## 2018-08-12 ENCOUNTER — Encounter: Payer: Self-pay | Admitting: Hematology and Oncology

## 2018-08-12 ENCOUNTER — Inpatient Hospital Stay: Payer: Medicare Other | Attending: Hematology and Oncology

## 2018-08-12 ENCOUNTER — Inpatient Hospital Stay: Payer: Medicare Other | Admitting: Hematology and Oncology

## 2018-08-12 VITALS — BP 120/45 | HR 57 | Temp 97.7°F | Resp 18 | Ht 62.0 in | Wt 163.7 lb

## 2018-08-12 DIAGNOSIS — R634 Abnormal weight loss: Secondary | ICD-10-CM | POA: Insufficient documentation

## 2018-08-12 DIAGNOSIS — Z87891 Personal history of nicotine dependence: Secondary | ICD-10-CM | POA: Insufficient documentation

## 2018-08-12 DIAGNOSIS — C50212 Malignant neoplasm of upper-inner quadrant of left female breast: Secondary | ICD-10-CM | POA: Diagnosis not present

## 2018-08-12 DIAGNOSIS — D472 Monoclonal gammopathy: Secondary | ICD-10-CM | POA: Insufficient documentation

## 2018-08-12 DIAGNOSIS — Z170 Estrogen receptor positive status [ER+]: Secondary | ICD-10-CM | POA: Diagnosis not present

## 2018-08-12 DIAGNOSIS — Z79899 Other long term (current) drug therapy: Secondary | ICD-10-CM | POA: Diagnosis not present

## 2018-08-12 LAB — COMPREHENSIVE METABOLIC PANEL
ALK PHOS: 111 U/L (ref 38–126)
ALT: 12 U/L (ref 0–44)
AST: 16 U/L (ref 15–41)
Albumin: 3.7 g/dL (ref 3.5–5.0)
Anion gap: 3 — ABNORMAL LOW (ref 5–15)
BUN: 17 mg/dL (ref 8–23)
CHLORIDE: 107 mmol/L (ref 98–111)
CO2: 26 mmol/L (ref 22–32)
Calcium: 9.1 mg/dL (ref 8.9–10.3)
Creatinine, Ser: 1.54 mg/dL — ABNORMAL HIGH (ref 0.44–1.00)
GFR calc non Af Amer: 30 mL/min — ABNORMAL LOW (ref >60)
GFR, EST AFRICAN AMERICAN: 35 mL/min — AB (ref >60)
Glucose, Bld: 112 mg/dL — ABNORMAL HIGH (ref 70–99)
Potassium: 3.9 mmol/L (ref 3.5–5.1)
SODIUM: 136 mmol/L (ref 135–145)
Total Bilirubin: 0.7 mg/dL (ref 0.3–1.2)
Total Protein: 8.6 g/dL — ABNORMAL HIGH (ref 6.5–8.1)

## 2018-08-12 LAB — CBC WITH DIFFERENTIAL/PLATELET
Abs Immature Granulocytes: 0.02 10*3/uL (ref 0.00–0.07)
Basophils Absolute: 0 10*3/uL (ref 0.0–0.1)
Basophils Relative: 1 %
Eosinophils Absolute: 0 10*3/uL (ref 0.0–0.5)
Eosinophils Relative: 0 %
HCT: 37.8 % (ref 36.0–46.0)
Hemoglobin: 11.8 g/dL — ABNORMAL LOW (ref 12.0–15.0)
Immature Granulocytes: 0 %
LYMPHS PCT: 18 %
Lymphs Abs: 1.1 10*3/uL (ref 0.7–4.0)
MCH: 29.1 pg (ref 26.0–34.0)
MCHC: 31.2 g/dL (ref 30.0–36.0)
MCV: 93.3 fL (ref 80.0–100.0)
MONOS PCT: 7 %
Monocytes Absolute: 0.4 10*3/uL (ref 0.1–1.0)
NRBC: 0 % (ref 0.0–0.2)
Neutro Abs: 4.8 10*3/uL (ref 1.7–7.7)
Neutrophils Relative %: 74 %
PLATELETS: 208 10*3/uL (ref 150–400)
RBC: 4.05 MIL/uL (ref 3.87–5.11)
RDW: 14.2 % (ref 11.5–15.5)
WBC: 6.5 10*3/uL (ref 4.0–10.5)

## 2018-08-12 LAB — TSH: TSH: 2.057 u[IU]/mL (ref 0.350–4.500)

## 2018-08-12 NOTE — Progress Notes (Signed)
[facility] day:  08/12/2018   Chief Complaint: Erica Jackson is a 84 y.o. female with stage I left breast cancer and a monoclonal gammopathy of unknown significance (MGUS) who is seen for 4 month assessment on Femara.    HPI:  The patient was last seen in the medical oncology clinic on 04/15/2018.  At that time,  she felt "good".  Exam revealed a palpable right breast nodule (biopsy negative).  Hemoglobin was 12.1, hematocrit 36.6, and platelets 235,000.  Creatinine was 1.36.  CA27.29 was normal.  During the interim, patient is doing well. She denies any acute concerns. Patient is feeling generally well. Patient denies that she has experienced any B symptoms. She denies any interval infections.  Patient denies nausea, vomiting, and diarrhea. She continues to have arthritic pains and stiffness in her hands. She states, "They told me that it was from holding my iPad". She has pain in her ankles with no associated swelling.   Patient does not verbalize any concerns with regards to her breasts today. Patient performs monthly self breast examinations as recommended. Patient continues on her endocrine therapy (letrozole) with no perceived side effects.   Patient advises that she maintains an adequate appetite. She is eating well. Weight today is 163 lb 11.1 oz (74.2 kg), which compared to her last visit to the clinic, represents a 4 pound decrease. Patient is not overly concerned with her weight loss.   Patient denies pain in the clinic today.   Past Medical History:  Diagnosis Date  . Breast cancer ([place]) 2017   Lower inner quadrant left breast with lumpectomy and rad tx  . Breast cancer, left breast ([place]) 08/04/2016   Overview:  0.9 cm grade II invasive mammary carcinoma. Tumor was ER positive (> 90%), PR negative, and Her2 2+ (FISH negative)  . Cataract   . Dysrhythmia    no problems in a long time  . GERD (gastroesophageal reflux disease)   .  Headache   . Hypercholesterolemia   . Hypertension   . Personal history of radiation therapy 11/2016   for left breast ca    Past Surgical History:  Procedure Laterality Date  . ABDOMINAL HYSTERECTOMY    . BREAST BIOPSY Left 08/28/2016   invasive mammary ca  . BREAST BIOPSY Right 2018  . BREAST EXCISIONAL BIOPSY Left 09/17/2016   lt breast ca  . BREAST LUMPECTOMY Left 09/2016   invasive mammary carcinoma with clear margins and rad tx  . BREAST LUMPECTOMY WITH SENTINEL LYMPH NODE BIOPSY Left 09/17/2016   Procedure: BREAST LUMPECTOMY WITH SENTINEL LYMPH NODE BX;  Surgeon: Clayburn Pert, MD;  Location: [facility] ORS;  Service: General;  Laterality: Left;  . EYE SURGERY Left 2015    Family History  Problem Relation Age of Onset  . Stroke Mother   . Cancer Sister   . Cancer Brother   . Cancer Daughter   . Cancer Brother   . Cancer Brother   . Cancer Brother   . Cancer Brother   . Cancer Brother   . Breast cancer Neg Hx    The patient notes longevity in her family.  Her aunt lived to 31.  Her great grandmother lived to 29.  Her sister has had multiple myeloma x 10 years.  Social History:  reports that she quit smoking about 28 years ago. She smoked 1.00 pack per day. She quit smokeless tobacco use about 28 years ago. She reports that she does  not drink alcohol or use drugs.  The patient previously smoked 1 pack/week for 5-10 years.  She stopped smoking in 1990.  Her daughter, [name], is a Software engineer.  The patient is alone today.   Allergies: No Known Allergies  Current Medications: Current Outpatient Medications  Medication Sig Dispense Refill  . aspirin EC 81 MG tablet Take 81 mg by mouth daily.     Marland Kitchen atenolol (TENORMIN) 25 MG tablet TAKE ONE TABLET BY MOUTH ONCE DAILY    . felodipine (PLENDIL) 5 MG 24 hr tablet Take 5 mg by mouth daily.     . isosorbide dinitrate (ISORDIL) 20 MG tablet Take 20 mg by mouth 2 (two) times daily.     Marland Kitchen letrozole (FEMARA) 2.5 MG tablet TAKE 1  TABLET BY MOUTH ONCE DAILY 90 tablet 0  . lovastatin (MEVACOR) 10 MG tablet TAKE ONE TABLET BY MOUTH ONCE DAILY WITH DINNER    . omeprazole (PRILOSEC) 20 MG capsule Take 20 mg by mouth daily.     . potassium chloride SA (K-DUR,KLOR-CON) 20 MEQ tablet Take 20 mEq by mouth daily.     Marland Kitchen acetaminophen (TYLENOL) 650 MG CR tablet Take 1 tablet by mouth every 8 (eight) hours.     No current facility-administered medications for this visit.     Review of Systems  Constitutional: Positive for weight loss (down 4 pounds). Negative for diaphoresis, fever and malaise/fatigue.       "I'm doing good".  HENT: Negative.  Negative for congestion, ear discharge, ear pain, nosebleeds and sore throat.   Eyes: Negative.  Negative for double vision, photophobia, pain and redness.  Respiratory: Negative.  Negative for cough, hemoptysis, sputum production and shortness of breath.   Cardiovascular: Negative.  Negative for chest pain, palpitations, orthopnea, leg swelling and PND.  Gastrointestinal: Negative.  Negative for abdominal pain, blood in stool, constipation, diarrhea, melena, nausea and vomiting.  Genitourinary: Negative.  Negative for dysuria, frequency, hematuria and urgency.  Musculoskeletal: Positive for joint pain (OA; pain in ankles. Stiffness in hands - "it's from holding my iPad"). Negative for back pain, falls and myalgias.  Skin: Negative for itching and rash.  Neurological: Negative for dizziness, tremors, sensory change, speech change, focal weakness, weakness and headaches.  Endo/Heme/Allergies: Does not bruise/bleed easily.  Psychiatric/Behavioral: Negative for depression and memory loss. The patient is not nervous/anxious and does not have insomnia.   All other systems reviewed and are negative.  Performance status (ECOG): 1 - Symptomatic but completely ambulatory  Vital Signs BP (!) 120/45 (BP Location: Right Arm, Patient Position: Sitting)   Pulse (!) 57   Temp 97.7 F (36.5 C)  (Tympanic)   Resp 18   Ht _0  (1.575 m)   Wt 163 lb 11.1 oz (74.2 kg)   SpO2 100%   BMI 29.94 kg/m   Physical Exam  Constitutional: She is oriented to person, place, and time and well-developed, well-nourished, and in no distress.  HENT:  Head: Normocephalic and atraumatic.  Mouth/Throat: Oropharynx is clear and moist and mucous membranes are normal.  Wearing a cap.  Gray hair.  Eyes: Pupils are equal, round, and reactive to light. Conjunctivae and EOM are normal. No scleral icterus.  Glasses.  Brown eyes.  Amblyopia in RIGHT eye.   Neck: Normal range of motion. Neck supple. No JVD present.  Cardiovascular: Normal rate, regular rhythm, normal heart sounds and intact distal pulses. Exam reveals no gallop and no friction rub.  No murmur heard. Pulmonary/Chest: Effort normal and breath  sounds normal. No respiratory distress. She has no wheezes. She has no rales. Right breast exhibits skin change (Fibrocystic changes.  Chronic fingertip nodule at the 12:30 position). Right breast exhibits no inverted nipple, no mass, no nipple discharge and no tenderness. Left breast exhibits inverted nipple, mass and skin change (inferior edema). Left breast exhibits no nipple discharge and no tenderness. Breasts are symmetrical.  Abdominal: Soft. Bowel sounds are normal. She exhibits no distension and no mass. There is no tenderness. There is no rebound and no guarding.  Musculoskeletal: Normal range of motion. She exhibits no edema or tenderness.  Lymphadenopathy:    She has no cervical adenopathy.    She has no axillary adenopathy.       Right: No inguinal and no supraclavicular adenopathy present.       Left: No inguinal and no supraclavicular adenopathy present.  Neurological: She is alert and oriented to person, place, and time.  Skin: Skin is warm and dry. No rash noted. No erythema.  Psychiatric: Mood, affect and judgment normal.  Nursing note and vitals reviewed.   Appointment on 08/12/2018   Component Date Value Ref Range Status  . Sodium 08/12/2018 136  135 - 145 mmol/L Final  . Potassium 08/12/2018 3.9  3.5 - 5.1 mmol/L Final  . Chloride 08/12/2018 107  98 - 111 mmol/L Final  . CO2 08/12/2018 26  22 - 32 mmol/L Final  . Glucose, Bld 08/12/2018 112* 70 - 99 mg/dL Final  . BUN 08/12/2018 17  8 - 23 mg/dL Final  . Creatinine, Ser 08/12/2018 1.54* 0.44 - 1.00 mg/dL Final  . Calcium 08/12/2018 9.1  8.9 - 10.3 mg/dL Final  . Total Protein 08/12/2018 8.6* 6.5 - 8.1 g/dL Final  . Albumin 08/12/2018 3.7  3.5 - 5.0 g/dL Final  . AST 08/12/2018 16  15 - 41 U/L Final  . ALT 08/12/2018 12  0 - 44 U/L Final  . Alkaline Phosphatase 08/12/2018 111  38 - 126 U/L Final  . Total Bilirubin 08/12/2018 0.7  0.3 - 1.2 mg/dL Final  . GFR calc non Af Amer 08/12/2018 30* >60 mL/min Final  . GFR calc Af Amer 08/12/2018 35* >60 mL/min Final   Comment: (NOTE) The eGFR has been calculated using the CKD EPI equation. This calculation has not been validated in all clinical situations. eGFR's persistently <60 mL/min signify possible Chronic Kidney Disease.   Georgiann Hahn gap 08/12/2018 3* 5 - 15 Final   Performed at [facility], [address], [place], [place] [address]  . WBC 08/12/2018 6.5  4.0 - 10.5 K/uL Final  . RBC 08/12/2018 4.05  3.87 - 5.11 MIL/uL Final  . Hemoglobin 08/12/2018 11.8* 12.0 - 15.0 g/dL Final  . HCT 08/12/2018 37.8  36.0 - 46.0 % Final  . MCV 08/12/2018 93.3  80.0 - 100.0 fL Final  . MCH 08/12/2018 29.1  26.0 - 34.0 pg Final  . MCHC 08/12/2018 31.2  30.0 - 36.0 g/dL Final  . RDW 08/12/2018 14.2  11.5 - 15.5 % Final  . Platelets 08/12/2018 208  150 - 400 K/uL Final  . nRBC 08/12/2018 0.0  0.0 - 0.2 % Final  . Neutrophils Relative % 08/12/2018 74  % Final  . Neutro Abs 08/12/2018 4.8  1.7 - 7.7 K/uL Final  . Lymphocytes Relative 08/12/2018 18  % Final  . Lymphs Abs 08/12/2018 1.1  0.7 - 4.0 K/uL Final  . Monocytes Relative 08/12/2018 7  % Final  . Monocytes  Absolute 08/12/2018 0.4  0.1 - 1.0 K/uL Final  . Eosinophils Relative 08/12/2018 0  % Final  . Eosinophils Absolute 08/12/2018 0.0  0.0 - 0.5 K/uL Final  . Basophils Relative 08/12/2018 1  % Final  . Basophils Absolute 08/12/2018 0.0  0.0 - 0.1 K/uL Final  . Immature Granulocytes 08/12/2018 0  % Final  . Abs Immature Granulocytes 08/12/2018 0.02  0.00 - 0.07 K/uL Final   Performed at [facility], [address] [place], [place] [address]    Assessment:  Erica Jackson is a 84 y.o. female with stage I left breast cancer s/p lumpectomy and sentinel lymph node biopsy on 09/17/2016.  Pathology revealed a 1.0 cm grade II invasive mammary carcinoma of no special type.  Margins were negative.  There was no tumor in 1 sentinel lymph node.  Pathologic stage was T1bN0M0.  Mammogram and ultrasound on 08/16/2016 revealed a 1.1 x 0.6 x 0.8 cm mass at the 10 o'clock position.  Left breast biopsy on 08/28/2016 revealed a 0.9 cm grade II invasive mammary carcinoma.  Tumor was ER positive (> 90%), PR negative, and Her2 2+ (negative by FISH).    She completed radiation on 11/28/2016.  She began Femara on 12/11/2016.  CA27.29 has been followed: 21.7 on 09/11/2016, 20.9 on 04/09/2017, 21.3 on 07/16/2017, 22 on 10/15/2017, 26.4 on 01/14/2018, 24.1 on 04/15/2018, and 21.8 on 08/12/2018.  Bilateral diagnostic mammogram on 03/11/2017 which revealed no evidence of malignancy s/p lumpectomy.    Right sided mammogram and ultrasound on 02/19/2018 revealed a 0.6 x 0.7 x 0.7 cm mass at the 12:30 position 3 cm from the nipple.  Ultrasound guided biopsy right breast biopsy and clip placement on 02/27/2018 revealed a hypocellular, circumscribed hyalanized nodule with no atypia or malignancy.  Bone density study on 09/25/2016 was normal with a T score of 0.4 in the left femoral neck.   She has a monoclonal gammopathy of unknown significance (MGUS).  SPEP on 12/11/2016 revealed a 0.4 gm/dL monoclonal protein.   She has mild renal insufficiency (Cr 1.28; CrCl 45 ml/min).  Work-up on 12/18/2016 revealed an immunofixation with an IgG monoclonal protein with kappa light chain specificity.  IgG was 2081.  Free light chain ratio was 1.28 (normal).  Beta 2 microglobulin was 2.4 (normal).  24 hour urine revealed no monoclonal protein or free light chains.  Bone survey on 01/08/2017 revealed no lytic lesions.  M-spike has been followed (gm/dL):  0.4 on 12/11/2016, 0.6 on 07/16/2017, 0.4 on 01/14/2018, and 0.6 on 08/12/2018.  IgG was 2041 on 12/18/2016 and 1926 on 07/16/2017.  Symptomatically, she is doing well.  She denies any acute complaints.  Patient complains of chronic pain in her ankles and hands related to known osteoarthritis.  She denies B symptoms, recurrent infections, or breast concerns.  Exam is grossly unremarkable.  BUN 17 and creatinine 1.54 (CrCl 26.6 mL/min).  M spike is 0.6 (previously 0.4 g/dL).  Plan: 1. Labs today:  CBC with diff, CMP, CA27.29, SPEP, TSH 2. Stage I left breast cancer  Doing well.  No breast concerns.  Tumor marker stable.  Continues on letrozole with no perceived side effects.  Bilateral mammogram scheduled for 08/26/2018. 3. Monoclonal gammopathy of unknown significance (MGUS)  SPEP revealed a monoclonal protein spike of 0.6 g/dL (previously 0.4 g/dL).  Stable.  We will continue to monitor. 4. Weight loss  Her weight today is 163 lb 11.1 oz (74.2 kg). Body mass index is 29.94 kg/m.  Patient is  not overly concerned with weight loss.  We will continue to monitor. 5. Health maintenance  Bone density scheduled for 09/28/2018. 6. RTC in 4 months for MD assessment and labs (CBC with diff, CMP, CA27.29, SPEP).  Honor Loh, NP 08/12/18, 11:37 AM   I saw and evaluated the patient, participating in the key portions of the service and reviewing pertinent diagnostic studies and records.  I reviewed the nurse practitioner's note and agree with the findings and the  plan.  The assessment and plan were discussed with the patient.  Multiple questions were asked by the patient and answered.   Nolon Stalls, MD 08/12/2018,11:37 AM

## 2018-08-12 NOTE — Progress Notes (Signed)
Patient has been c/o light headed x 1 week / no dizziness noted today

## 2018-08-13 LAB — PROTEIN ELECTROPHORESIS, SERUM
A/G Ratio: 0.8 (ref 0.7–1.7)
ALBUMIN ELP: 3.4 g/dL (ref 2.9–4.4)
Alpha-1-Globulin: 0.2 g/dL (ref 0.0–0.4)
Alpha-2-Globulin: 0.8 g/dL (ref 0.4–1.0)
Beta Globulin: 1.3 g/dL (ref 0.7–1.3)
Gamma Globulin: 2 g/dL — ABNORMAL HIGH (ref 0.4–1.8)
Globulin, Total: 4.3 g/dL — ABNORMAL HIGH (ref 2.2–3.9)
M-Spike, %: 0.6 g/dL — ABNORMAL HIGH
TOTAL PROTEIN ELP: 7.7 g/dL (ref 6.0–8.5)

## 2018-08-13 LAB — CANCER ANTIGEN 27.29: CAN 27.29: 21.8 U/mL (ref 0.0–38.6)

## 2018-08-26 ENCOUNTER — Ambulatory Visit
Admission: RE | Admit: 2018-08-26 | Discharge: 2018-08-26 | Disposition: A | Discharge status: Home / Self Care | Payer: Medicare Other | Source: Ambulatory Visit | Attending: Surgery | Admitting: Surgery

## 2018-08-26 DIAGNOSIS — C50312 Malignant neoplasm of lower-inner quadrant of left female breast: Secondary | ICD-10-CM | POA: Diagnosis present

## 2018-08-26 DIAGNOSIS — Z170 Estrogen receptor positive status [ER+]: Secondary | ICD-10-CM | POA: Insufficient documentation

## 2018-09-03 ENCOUNTER — Other Ambulatory Visit: Payer: Self-pay | Admitting: Hematology and Oncology

## 2018-09-03 DIAGNOSIS — Z170 Estrogen receptor positive status [ER+]: Principal | ICD-10-CM

## 2018-09-03 DIAGNOSIS — C50212 Malignant neoplasm of upper-inner quadrant of left female breast: Secondary | ICD-10-CM

## 2018-09-08 ENCOUNTER — Ambulatory Visit (INDEPENDENT_AMBULATORY_CARE_PROVIDER_SITE_OTHER): Payer: Medicare Other | Admitting: Surgery

## 2018-09-08 ENCOUNTER — Encounter: Payer: Self-pay | Admitting: Surgery

## 2018-09-08 ENCOUNTER — Other Ambulatory Visit: Payer: Self-pay

## 2018-09-08 VITALS — BP 128/67 | HR 73 | Temp 97.7°F | Resp 14 | Ht 64.0 in | Wt 168.0 lb

## 2018-09-08 DIAGNOSIS — C50312 Malignant neoplasm of lower-inner quadrant of left female breast: Secondary | ICD-10-CM | POA: Diagnosis not present

## 2018-09-08 DIAGNOSIS — Z853 Personal history of malignant neoplasm of breast: Secondary | ICD-10-CM | POA: Diagnosis not present

## 2018-09-08 DIAGNOSIS — Z170 Estrogen receptor positive status [ER+]: Secondary | ICD-10-CM | POA: Diagnosis not present

## 2018-09-08 NOTE — Patient Instructions (Addendum)
The patient is aware to call back for any questions or new concerns.  The patient has been asked to return to the office in one year with a bilateral screening mammogram.  

## 2018-09-08 NOTE — Progress Notes (Signed)
S[facility] Clinic Progress/Follow-up Note   HPI:  84 y.o. Female presents to clinic for follow-up evaluation and discussion of recent bilateral diagnostic mammogram, now 2 years s/p Left breast lumpectomy with sentinel lymph node biopsy Adonis Huguenin, 09/2016) for biopsy-proven Left breast invasive adenocarcinoma. Patient reports Left breast "tenderness", though explicitly denies any breast or axillary pain and likewise denies any palpable breast mass or nipple discharge. She otherwise denies any recent fever/chills, unintentional weight loss/gain, N/V, CP, or SOB.  Review of Systems:  Constitutional: denies any other weight loss, fever, chills, or sweats  Eyes: denies any other vision changes, history of eye injury  ENT: denies sore throat, hearing problems  Respiratory: denies shortness of breath, wheezing  Cardiovascular: denies chest pain, palpitations Breasts: breast pain, Left axillary "tenderness", breast masses, and nipple discharge as per interval history Gastrointestinal: denies abdominal pain, N/V, or diarrhea Musculoskeletal: denies any other joint pains or cramps  Skin: Denies any other rashes or skin discolorations  Neurological: denies any other headache, dizziness, weakness  Psychiatric: denies any other depression, anxiety  All other review of systems: otherwise negative   Vital Signs:  BP 128/67   Pulse 73   Temp 97.7 F (36.5 C) (Skin)   Resp 14   Ht 5\' 4"  (1.626 m)   Wt 168 lb (76.2 kg)   SpO2 98%   BMI 28.84 kg/m    Physical Exam:  Constitutional:  -- Normal body habitus  -- Awake, alert, and oriented x3  Eyes:  -- Pupils equally round and reactive to light  -- No scleral icterus  Ear, nose, throat:  -- No jugular venous distension  -- No nasal drainage, bleeding Pulmonary:  -- No crackles -- Equal breath sounds bilaterally -- Breathing non-labored at rest Cardiovascular:  -- S1, S2 present  -- No pericardial rubs  Breasts: -- Well-healed non-tender  non-erythematous Left post-surgical linear scar -- Unchanged non-tender non-erythematous soft spongy skin tag above Left nipple -- B/L no palpable breast mass, nipple discharge, or axillary lymphadenopathy Gastrointestinal:  -- Soft, nontender, non-distended, no guarding/rebound  -- No abdominal masses appreciated, pulsatile or otherwise  Musculoskeletal / Integumentary:  -- Wounds or skin discoloration: None appreciated except as described above (Breasts) -- Extremities: B/L UE and LE FROM, hands and feet warm, no edema  Neurologic:  -- Motor function: intact and symmetric  -- Sensation: intact and symmetric   Imaging:  Bilateral Diagnostic Mammogram (08/26/2018) ACR Breast Density Category C:  heterogeneously dense, which may obscure small masses. CC and MLO views of bilateral breasts, spot tangential view of left breast are submitted. Stable postsurgical and postradiation changes are identified in the left breast. No suspicious abnormalities identified bilaterally. BI-RADS CATEGORY  2: Benign.  Assessment:  84 y.o. yo Female with a problem list including...  Patient Active Problem List   Diagnosis Date Noted  . History of breast cancer 12/19/2016  . Monoclonal gammopathy of unknown significance (MGUS) 12/11/2016  . Malignant neoplasm of upper-inner quadrant of left breast in female, estrogen receptor positive ([place])   . Chronic renal insufficiency 03/02/2014  . Coronary disease 03/02/2014  . DM2 (diabetes mellitus, type 2) ([place]) 03/02/2014  . GERD (gastroesophageal reflux disease) 03/02/2014  . HTN (hypertension) 03/02/2014  . Hypercholesterolemia 03/02/2014    presents to clinic for follow-up evaluation and discussion of recent bilateral diagnostic mammogram, doing well , now 2 years s/p Left breast lumpectomy with sentinel lymph node biopsy Adonis Huguenin, 09/2016) for biopsy-proven Left breast invasive adenocarcinoma.  Plan:   - results of  recent mammogram discussed   - all of  patient's questions were answered to her expressed satisfaction  - may resume annual mammogram with post-mammogram exam/follow-up  - instructed to call office if any changes, questions, or concerns  All of the above recommendations were discussed with the patient, and all of patient's questions were answered to her expressed satisfaction.  -- Marilynne Drivers Rosana Hoes, MD, [place]: [facility] - Partnering for exceptional care. Office: [phone]

## 2018-09-28 ENCOUNTER — Ambulatory Visit
Admission: RE | Admit: 2018-09-28 | Discharge: 2018-09-28 | Disposition: A | Discharge status: Home / Self Care | Payer: Medicare Other | Source: Ambulatory Visit | Attending: Urgent Care | Admitting: Urgent Care

## 2018-09-28 DIAGNOSIS — D472 Monoclonal gammopathy: Secondary | ICD-10-CM | POA: Diagnosis present

## 2018-09-28 DIAGNOSIS — C50212 Malignant neoplasm of upper-inner quadrant of left female breast: Secondary | ICD-10-CM

## 2018-09-28 DIAGNOSIS — Z79811 Long term (current) use of aromatase inhibitors: Secondary | ICD-10-CM | POA: Diagnosis present

## 2018-09-28 DIAGNOSIS — Z170 Estrogen receptor positive status [ER+]: Secondary | ICD-10-CM | POA: Diagnosis present

## 2018-10-15 IMAGING — MG 2D DIGITAL DIAGNOSTIC BILATERAL MAMMOGRAM WITH CAD AND ADJUNCT T
8 of 14 series · 8 of 30 positions shown · non-contrast
Comparison: Previous exam(s).

CLINICAL DATA: Status post left lumpectomy and radiation therapy
for breast cancer in 5416.

EXAM:
2D DIGITAL DIAGNOSTIC BILATERAL MAMMOGRAM WITH CAD AND ADJUNCT TOMO
ULTRASOUND RIGHT BREAST

[L MLO]
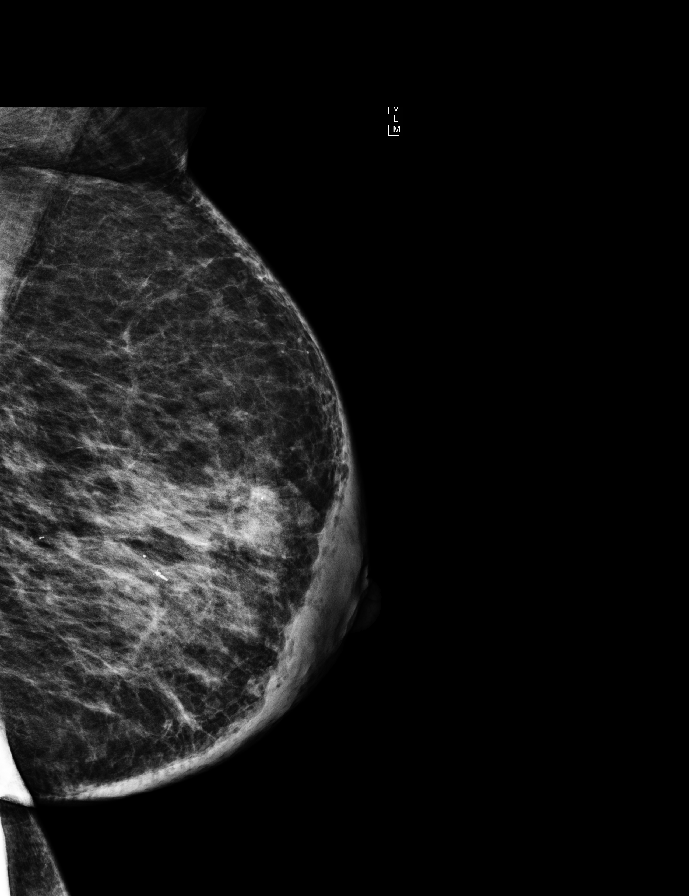

[L CC (1 of 2)]
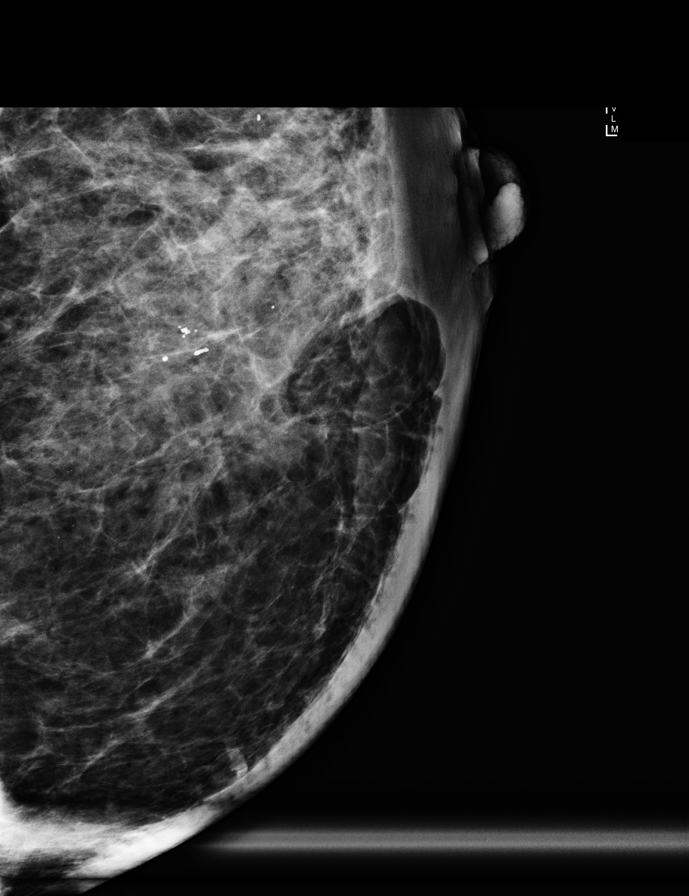

[L MLO synth-2D]
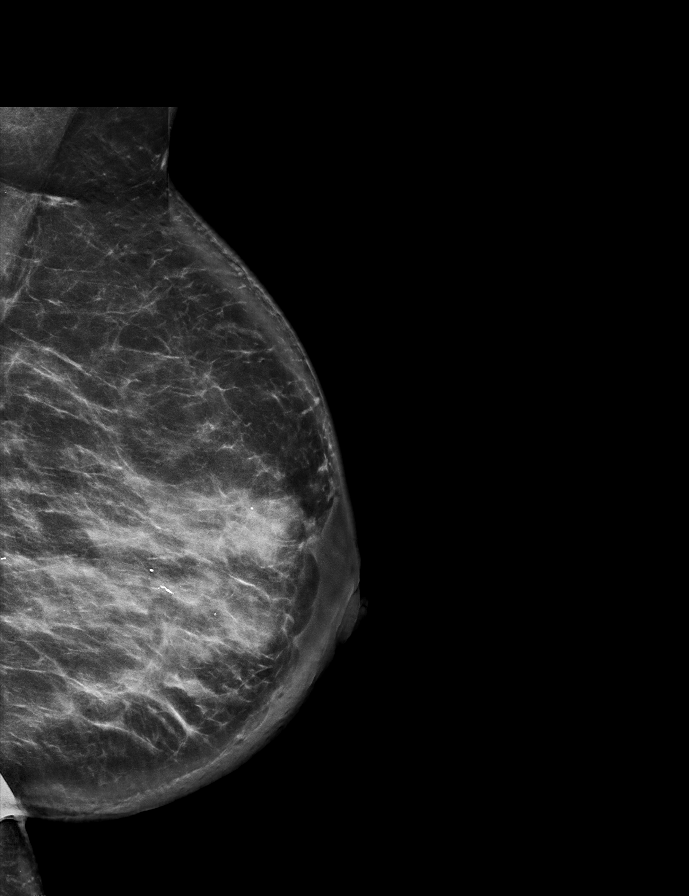

[L CC (2 of 2)]
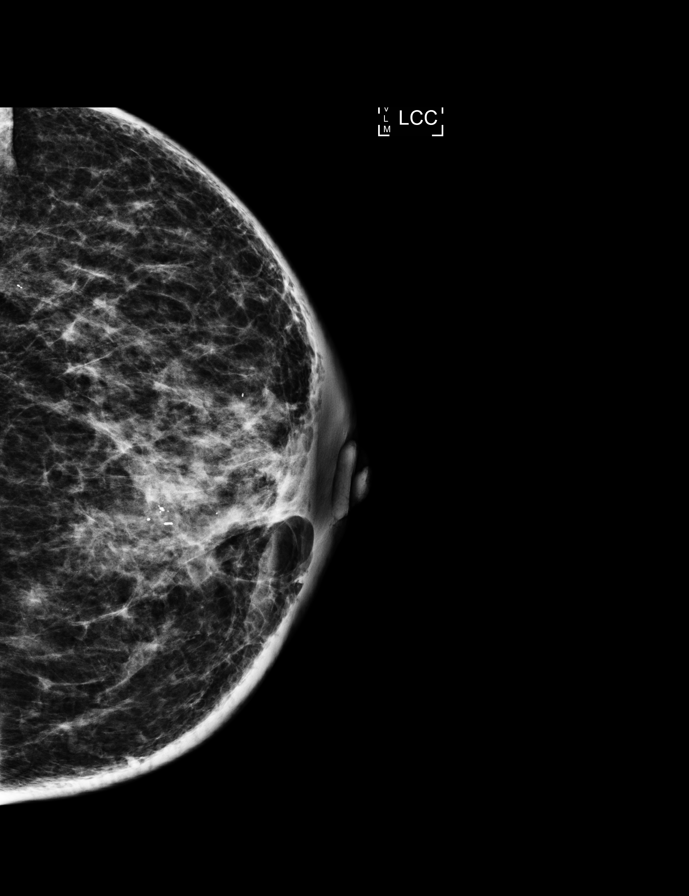

[R MLO synth-2D]
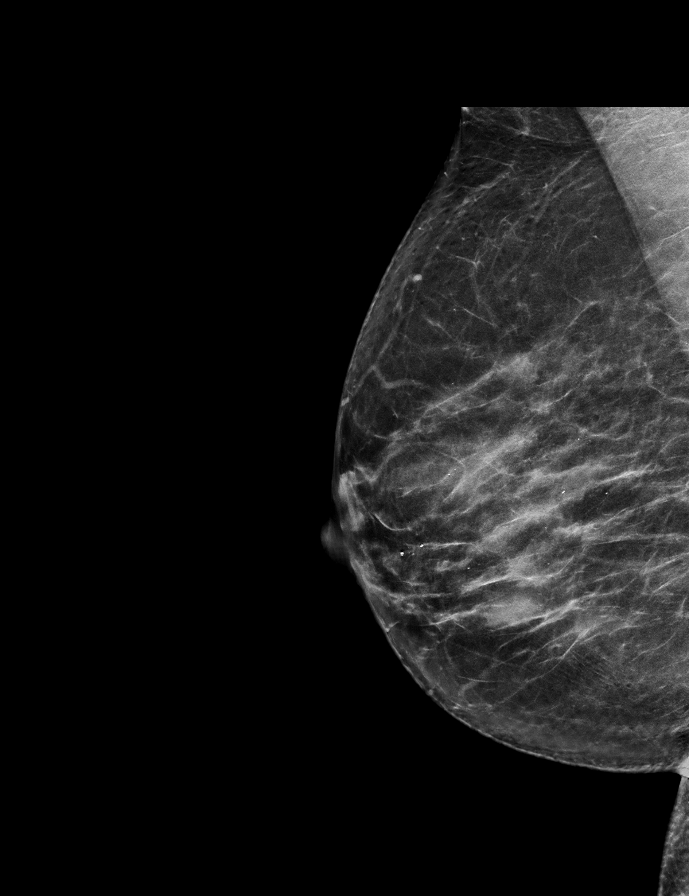

[R CC]
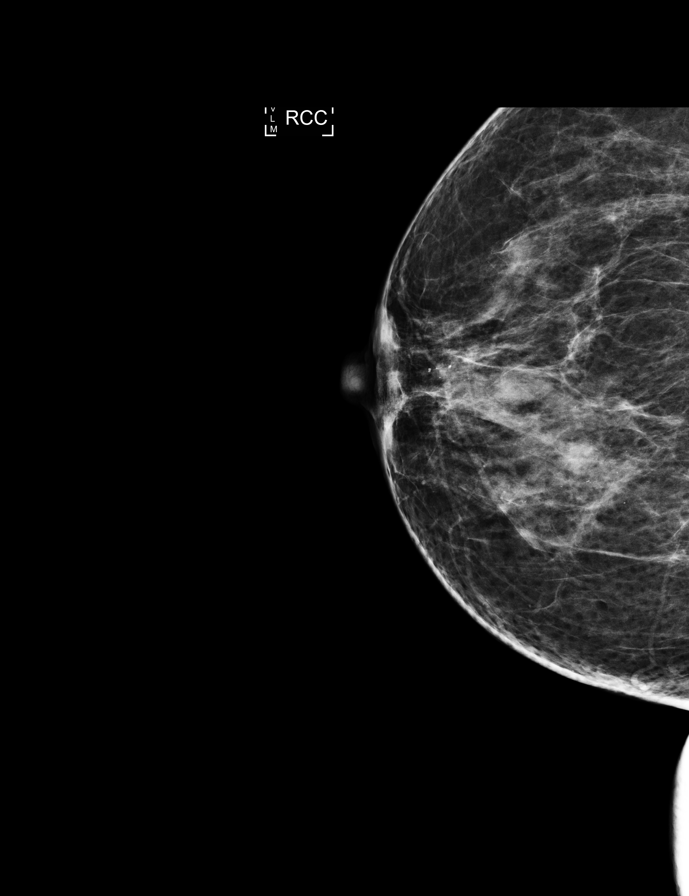

[R CC synth-2D]
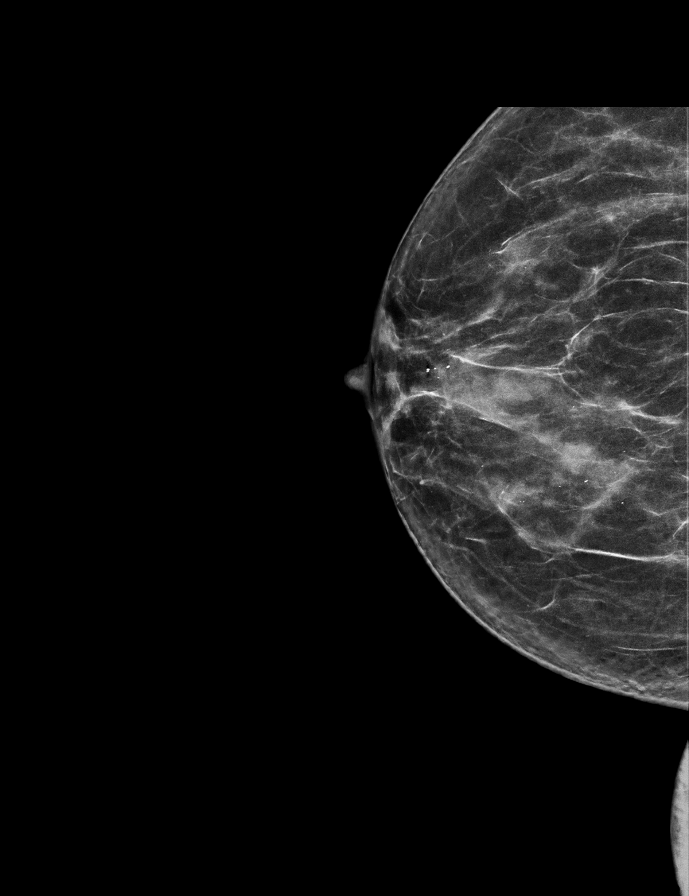

[L CC synth-2D]
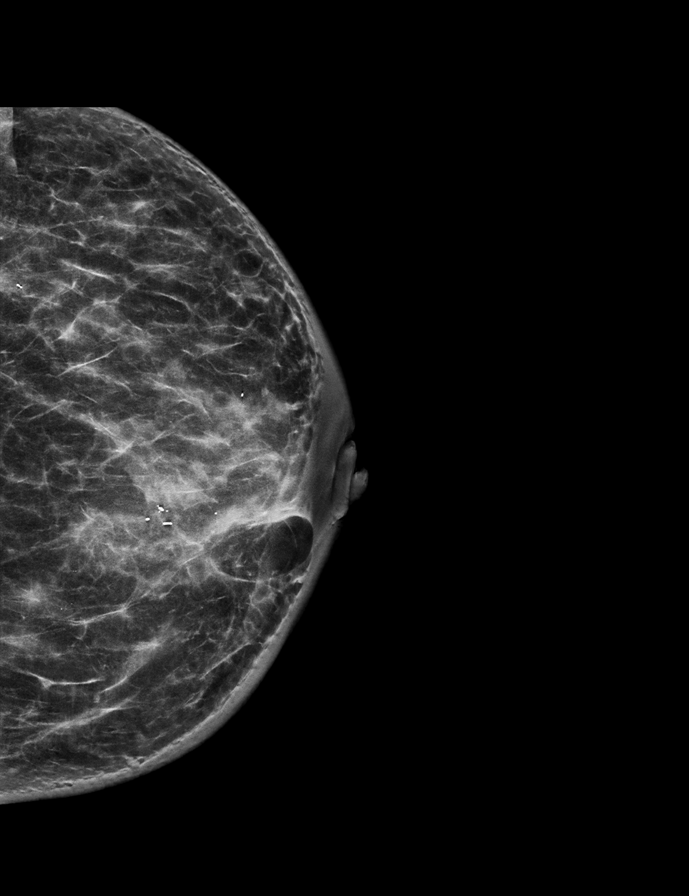

[8 of 30 positions shown; findings below may reference images not displayed]

ACR Breast Density Category c: The breast tissue is heterogeneously
dense, which may obscure small masses.
FINDINGS: Post lumpectomy and postradiation changes on the left with no
interval findings suspicious for malignancy on the left. There is an
interval 9 mm oval, circumscribed mass in the upper-outer quadrant
of the right breast in the middle third. An oval, circumscribed mass
in the 6 o'clock position of the right breast is unchanged over many
years, compatible with a benign process.

Mammographic images were processed with CAD.

On physical exam, no mass is palpable in the upper inner right
breast or right axilla.

Targeted ultrasound is performed, showing a 7 x 6 x 6 mm oval,
horizontally oriented, circumscribed, hypoechoic mass in the 12:30
o'clock position of the right breast, 3 cm from the nipple. This
exhibits some posterior acoustical shadowing. No internal blood flow
was seen with color Doppler.

Ultrasound of the right axilla demonstrated normal appearing right
axillary lymph nodes.
IMPRESSION: Interval 7 mm indeterminate mass in the 12:30 o'clock position of
the right breast.

RECOMMENDATION:
Ultrasound-guided core needle biopsy of the new 7 mm mass in the
12:30 o'clock position of the right breast. This will be scheduled
in consultation with Dr. Grimanis.

I have discussed the findings and recommendations with the patient.
Results were also provided in writing at the conclusion of the
visit. If applicable, a reminder letter will be sent to the patient
regarding the next appointment.

BI-RADS CATEGORY  4: Suspicious.

## 2018-12-11 ENCOUNTER — Other Ambulatory Visit: Payer: Self-pay | Admitting: Hematology and Oncology

## 2018-12-11 DIAGNOSIS — Z170 Estrogen receptor positive status [ER+]: Principal | ICD-10-CM

## 2018-12-11 DIAGNOSIS — C50212 Malignant neoplasm of upper-inner quadrant of left female breast: Secondary | ICD-10-CM

## 2018-12-16 ENCOUNTER — Inpatient Hospital Stay: Payer: Medicare Other

## 2018-12-16 ENCOUNTER — Inpatient Hospital Stay: Payer: Medicare Other | Attending: Hematology and Oncology | Admitting: Hematology and Oncology

## 2018-12-16 ENCOUNTER — Encounter: Payer: Self-pay | Admitting: Hematology and Oncology

## 2018-12-16 VITALS — BP 109/62 | HR 58 | Resp 18 | Ht 64.0 in | Wt 169.0 lb

## 2018-12-16 DIAGNOSIS — C50212 Malignant neoplasm of upper-inner quadrant of left female breast: Secondary | ICD-10-CM | POA: Insufficient documentation

## 2018-12-16 DIAGNOSIS — D472 Monoclonal gammopathy: Secondary | ICD-10-CM | POA: Diagnosis not present

## 2018-12-16 DIAGNOSIS — Z170 Estrogen receptor positive status [ER+]: Secondary | ICD-10-CM

## 2018-12-16 DIAGNOSIS — Z79811 Long term (current) use of aromatase inhibitors: Secondary | ICD-10-CM | POA: Insufficient documentation

## 2018-12-16 DIAGNOSIS — Z87891 Personal history of nicotine dependence: Secondary | ICD-10-CM

## 2018-12-16 LAB — COMPREHENSIVE METABOLIC PANEL
ALT: 10 U/L (ref 0–44)
AST: 15 U/L (ref 15–41)
Albumin: 3.5 g/dL (ref 3.5–5.0)
Alkaline Phosphatase: 107 U/L (ref 38–126)
Anion gap: 6 (ref 5–15)
BUN: 14 mg/dL (ref 8–23)
CO2: 27 mmol/L (ref 22–32)
Calcium: 9.1 mg/dL (ref 8.9–10.3)
Chloride: 108 mmol/L (ref 98–111)
Creatinine, Ser: 1.28 mg/dL — ABNORMAL HIGH (ref 0.44–1.00)
GFR calc Af Amer: 45 mL/min — ABNORMAL LOW (ref >60)
GFR calc non Af Amer: 39 mL/min — ABNORMAL LOW (ref >60)
Glucose, Bld: 135 mg/dL — ABNORMAL HIGH (ref 70–99)
Potassium: 3.8 mmol/L (ref 3.5–5.1)
Sodium: 141 mmol/L (ref 135–145)
Total Bilirubin: 0.6 mg/dL (ref 0.3–1.2)
Total Protein: 7.9 g/dL (ref 6.5–8.1)

## 2018-12-16 LAB — CBC WITH DIFFERENTIAL/PLATELET
Abs Immature Granulocytes: 0.03 10*3/uL (ref 0.00–0.07)
Basophils Absolute: 0 10*3/uL (ref 0.0–0.1)
Basophils Relative: 1 %
Eosinophils Absolute: 0 10*3/uL (ref 0.0–0.5)
Eosinophils Relative: 1 %
HCT: 35.7 % — ABNORMAL LOW (ref 36.0–46.0)
Hemoglobin: 11.1 g/dL — ABNORMAL LOW (ref 12.0–15.0)
Immature Granulocytes: 1 %
Lymphocytes Relative: 25 %
Lymphs Abs: 1.4 10*3/uL (ref 0.7–4.0)
MCH: 29.5 pg (ref 26.0–34.0)
MCHC: 31.1 g/dL (ref 30.0–36.0)
MCV: 94.9 fL (ref 80.0–100.0)
Monocytes Absolute: 0.5 10*3/uL (ref 0.1–1.0)
Monocytes Relative: 8 %
Neutro Abs: 3.7 10*3/uL (ref 1.7–7.7)
Neutrophils Relative %: 64 %
Platelets: 201 10*3/uL (ref 150–400)
RBC: 3.76 MIL/uL — ABNORMAL LOW (ref 3.87–5.11)
RDW: 14.2 % (ref 11.5–15.5)
WBC: 5.6 10*3/uL (ref 4.0–10.5)
nRBC: 0 % (ref 0.0–0.2)

## 2018-12-16 NOTE — Progress Notes (Signed)
Patient c/o feeling light headed and dizziness but states it is nothing new, Orthostatic has been done on the patient.

## 2018-12-16 NOTE — Progress Notes (Signed)
[facility] day:  12/16/2018   Chief Complaint: Erica Jackson is a 85 y.o. female with stage I left breast cancer and a monoclonal gammopathy of unknown significance (MGUS) who is seen for 4 month assessment on Femara.    HPI:  The patient was last seen in the medical oncology clinic on 08/12/2018.  At that time, she was doing well.  She denied any acute complaints.  Patient complained of chronic pain in her ankles and hands related to known osteoarthritis.  She denied B symptoms, recurrent infections, or breast concerns.  Exam was grossly unremarkable.  BUN was 17 and creatinine 1.54 (CrCl 26.6 mL/min).  M spike was 0.6 (previously 0.4 g/dL).  Bilateral diagnostic mammogram on 08/26/2018 revealed no evidence of malignancy.  Bone density was normal on 09/28/2018 with a T score of 0.7 in the AP spine L1-L2, 0.1 in the left femoral neck, and 0.9 in the right forearm radius.  Symptomatically, she feels well today.  She complains of vertigo symptoms; no associated orthostatic changes. Of note, patient presents BRADYcardic with a HR in the 50s. She is on daily beta blocker (atenolol) therapy.  She otherwise  feels generally well. Patient denies that she has experienced any B symptoms. She denies any interval infections.  Patient does not verbalize any concerns with regards to her breasts.  Patient performs monthly self breast examinations as recommended. She remains on her endocrine therapy (letrozole) as prescribed. She denies any associated side effects.   Chronic arthritic pain in hands and feet persists. She attributes pain in her hands to "holding her iPad".   Patient advises that she maintains an adequate appetite. She is eating well. Weight today is 168 lb 15.7 oz (76.6 kg), which compared to her last visit to the clinic, represents a 5 pound increase.   Patient denies pain in the clinic today.   Past Medical History:  Diagnosis Date  . Breast  cancer ([place]) 2017   Lower inner quadrant left breast with lumpectomy and rad tx  . Breast cancer, left breast ([place]) 08/04/2016   Overview:  0.9 cm grade II invasive mammary carcinoma. Tumor was ER positive (> 90%), PR negative, and Her2 2+ (FISH negative)  . Cataract   . Dysrhythmia    no problems in a long time  . GERD (gastroesophageal reflux disease)   . Headache   . Hypercholesterolemia   . Hypertension   . Malignant neoplasm of upper-inner quadrant of left breast in female, estrogen receptor positive ([place])   . Personal history of radiation therapy 11/2016   for left breast ca    Past Surgical History:  Procedure Laterality Date  . ABDOMINAL HYSTERECTOMY    . BREAST BIOPSY Left 08/28/2016   invasive mammary ca  . BREAST BIOPSY Right 08/2017   HYALINIZED STROMA  . BREAST BIOPSY Right 03/2018   HYPOCELLULAR HYALINIZED NODULE  . BREAST EXCISIONAL BIOPSY Left 09/17/2016   lt breast ca  . BREAST LUMPECTOMY Left 09/2016   invasive mammary carcinoma with clear margins and rad tx  . BREAST LUMPECTOMY WITH SENTINEL LYMPH NODE BIOPSY Left 09/17/2016   Procedure: BREAST LUMPECTOMY WITH SENTINEL LYMPH NODE BX;  Surgeon: Clayburn Pert, MD;  Location: [facility] ORS;  Service: General;  Laterality: Left;  . EYE SURGERY Left 2015  . OOPHORECTOMY      Family History  Problem Relation Age of Onset  . Stroke Mother   . Cancer Sister   . Cancer Brother   .  Cancer Daughter   . Cancer Brother   . Cancer Brother   . Cancer Brother   . Cancer Brother   . Cancer Brother   . Breast cancer Neg Hx    The patient notes longevity in her family.  Her aunt lived to 60.  Her great grandmother lived to 61.  Her sister has had multiple myeloma x 10 years.  Social History:  reports that she quit smoking about 29 years ago. She smoked 1.00 pack per day. She quit smokeless tobacco use about 29 years ago. She reports that she does not drink alcohol or use drugs.  The patient previously smoked 1  pack/week for 5-10 years.  She stopped smoking in 1990.  Her daughter, Erica Jackson, is a Software engineer.  The patient is alone today.   Allergies: No Known Allergies  Current Medications: Current Outpatient Medications  Medication Sig Dispense Refill  . aspirin EC 81 MG tablet Take 81 mg by mouth daily.     Marland Kitchen atenolol (TENORMIN) 25 MG tablet TAKE ONE TABLET BY MOUTH ONCE DAILY    . felodipine (PLENDIL) 5 MG 24 hr tablet Take 5 mg by mouth daily.     . isosorbide dinitrate (ISORDIL) 20 MG tablet Take 20 mg by mouth 2 (two) times daily.     Marland Kitchen letrozole (FEMARA) 2.5 MG tablet TAKE 1 TABLET BY MOUTH ONCE DAILY 90 tablet 0  . lovastatin (MEVACOR) 10 MG tablet TAKE ONE TABLET BY MOUTH ONCE DAILY WITH DINNER    . omeprazole (PRILOSEC) 20 MG capsule Take 20 mg by mouth daily.     . potassium chloride SA (K-DUR,KLOR-CON) 20 MEQ tablet Take 20 mEq by mouth daily.     Marland Kitchen acetaminophen (TYLENOL) 650 MG CR tablet Take 1 tablet by mouth every 8 (eight) hours.     No current facility-administered medications for this visit.     Review of Systems  Constitutional: Negative for chills, diaphoresis, fever, malaise/fatigue and weight loss (up 5 pounds).       "I feel good today".  HENT: Negative.  Negative for congestion, ear pain, nosebleeds, sinus pain and sore throat.   Eyes: Negative.  Negative for blurred vision, double vision, photophobia, pain and redness.  Respiratory: Negative.  Negative for cough, hemoptysis, sputum production and shortness of breath.   Cardiovascular: Negative.  Negative for chest pain, palpitations, orthopnea, leg swelling and PND.  Gastrointestinal: Negative.  Negative for abdominal pain, blood in stool, constipation, diarrhea, heartburn, melena, nausea and vomiting.  Genitourinary: Negative.  Negative for dysuria, frequency, hematuria and urgency.  Musculoskeletal: Positive for joint pain (OA; pain in ankles. Stiffness in hands - "it's from holding my iPad"). Negative for back pain,  falls, myalgias and neck pain.  Skin: Negative.  Negative for itching and rash.  Neurological: Negative.  Negative for dizziness, tremors, sensory change, speech change, focal weakness, weakness and headaches.  Endo/Heme/Allergies: Negative.  Does not bruise/bleed easily.  Psychiatric/Behavioral: Negative.  Negative for depression and memory loss. The patient is not nervous/anxious and does not have insomnia.   All other systems reviewed and are negative.  Performance status (ECOG): 1  Vital Signs BP 109/62 (BP Location: Left Arm, Patient Position: Sitting, Cuff Size: Large)   Pulse (!) 58   Resp 18   Ht 5' 4"  (1.626 m)   Wt 168 lb 15.7 oz (76.6 kg)   SpO2 100%   BMI 29.01 kg/m   Physical Exam  Constitutional: She is oriented to person, place, and time and  well-developed, well-nourished, and in no distress. No distress.  HENT:  Head: Normocephalic and atraumatic.  Mouth/Throat: Oropharynx is clear and moist and mucous membranes are normal. No oropharyngeal exudate.  Wearing a black cap.  Gray hair.  Eyes: Pupils are equal, round, and reactive to light. Conjunctivae and EOM are normal. No scleral icterus.  Glasses.  Brown eyes.  Amblyopia in RIGHT eye.   Neck: Normal range of motion. Neck supple. No JVD present.  Cardiovascular: Regular rhythm and normal heart sounds. Bradycardia present. Exam reveals no gallop and no friction rub.  No murmur heard. Pulmonary/Chest: Effort normal and breath sounds normal. No respiratory distress. She has no wheezes. She has no rales. Right breast exhibits skin change (stable fiibrocystic changes; chronic fingertip scarring at 12:30 position). Right breast exhibits no inverted nipple, no mass, no nipple discharge and no tenderness. Left breast exhibits inverted nipple and skin change (inferior post-operative and post radiation changes). Left breast exhibits no mass, no nipple discharge and no tenderness. Breasts are symmetrical.  Abdominal: Soft. Bowel  sounds are normal. She exhibits no distension and no mass. There is no abdominal tenderness. There is no rebound and no guarding.  Musculoskeletal: Normal range of motion.        General: No tenderness, deformity or edema.  Lymphadenopathy:    She has no cervical adenopathy.    She has no axillary adenopathy.       Right: No inguinal and no supraclavicular adenopathy present.       Left: No inguinal and no supraclavicular adenopathy present.  Neurological: She is alert and oriented to person, place, and time.  Skin: Skin is warm and dry. No rash noted. She is not diaphoretic. No erythema. No pallor.  Psychiatric: Mood, affect and judgment normal.  Nursing note and vitals reviewed.   Appointment on 12/16/2018  Component Date Value Ref Range Status  . Sodium 12/16/2018 141  135 - 145 mmol/L Final  . Potassium 12/16/2018 3.8  3.5 - 5.1 mmol/L Final  . Chloride 12/16/2018 108  98 - 111 mmol/L Final  . CO2 12/16/2018 27  22 - 32 mmol/L Final  . Glucose, Bld 12/16/2018 135* 70 - 99 mg/dL Final  . BUN 12/16/2018 14  8 - 23 mg/dL Final  . Creatinine, Ser 12/16/2018 1.28* 0.44 - 1.00 mg/dL Final  . Calcium 12/16/2018 9.1  8.9 - 10.3 mg/dL Final  . Total Protein 12/16/2018 7.9  6.5 - 8.1 g/dL Final  . Albumin 12/16/2018 3.5  3.5 - 5.0 g/dL Final  . AST 12/16/2018 15  15 - 41 U/L Final  . ALT 12/16/2018 10  0 - 44 U/L Final  . Alkaline Phosphatase 12/16/2018 107  38 - 126 U/L Final  . Total Bilirubin 12/16/2018 PENDING  0.3 - 1.2 mg/dL Incomplete  . GFR calc non Af Amer 12/16/2018 39* >60 mL/min Final  . GFR calc Af Amer 12/16/2018 45* >60 mL/min Final  . Anion gap 12/16/2018 6  5 - 15 Final   Performed at [facility], [address], [place], [place] [address]  . WBC 12/16/2018 5.6  4.0 - 10.5 K/uL Final  . RBC 12/16/2018 3.76* 3.87 - 5.11 MIL/uL Final  . Hemoglobin 12/16/2018 11.1* 12.0 - 15.0 g/dL Final  . HCT 12/16/2018 35.7* 36.0 - 46.0 % Final  . MCV 12/16/2018 94.9   80.0 - 100.0 fL Final  . MCH 12/16/2018 29.5  26.0 - 34.0 pg Final  . MCHC 12/16/2018 31.1  30.0 - 36.0  g/dL Final  . RDW 12/16/2018 14.2  11.5 - 15.5 % Final  . Platelets 12/16/2018 201  150 - 400 K/uL Final  . nRBC 12/16/2018 0.0  0.0 - 0.2 % Final  . Neutrophils Relative % 12/16/2018 64  % Final  . Neutro Abs 12/16/2018 3.7  1.7 - 7.7 K/uL Final  . Lymphocytes Relative 12/16/2018 25  % Final  . Lymphs Abs 12/16/2018 1.4  0.7 - 4.0 K/uL Final  . Monocytes Relative 12/16/2018 8  % Final  . Monocytes Absolute 12/16/2018 0.5  0.1 - 1.0 K/uL Final  . Eosinophils Relative 12/16/2018 1  % Final  . Eosinophils Absolute 12/16/2018 0.0  0.0 - 0.5 K/uL Final  . Basophils Relative 12/16/2018 1  % Final  . Basophils Absolute 12/16/2018 0.0  0.0 - 0.1 K/uL Final  . Immature Granulocytes 12/16/2018 1  % Final  . Abs Immature Granulocytes 12/16/2018 0.03  0.00 - 0.07 K/uL Final   Performed at [facility], [address] [place],  [address]    Assessment:  Erica Jackson is a 85 y.o. female with stage I left breast cancer s/p lumpectomy and sentinel lymph node biopsy on 09/17/2016.  Pathology revealed a 1.0 cm grade II invasive mammary carcinoma of no special type.  Margins were negative.  There was no tumor in 1 sentinel lymph node.  Pathologic stage was [id number].  Mammogram and ultrasound on 08/16/2016 revealed a 1.1 x 0.6 x 0.8 cm mass at the 10 o'clock position.  Left breast biopsy on 08/28/2016 revealed a 0.9 cm grade II invasive mammary carcinoma.  Tumor was ER positive (> 90%), PR negative, and Her2 2+ (negative by FISH).    She completed radiation on 11/28/2016.  She began Femara on 12/11/2016.  CA27.29 has been followed: 21.7 on 09/11/2016, 20.9 on 04/09/2017, 21.3 on 07/16/2017, 22 on 10/15/2017, 26.4 on 01/14/2018, 24.1 on 04/15/2018, 21.8 on 08/12/2018, and 22.3 on 12/16/2018.  Bilateral diagnostic mammogram on 08/26/2018 revealed no evidence of malignancy.  Right  sided mammogram and ultrasound on 02/19/2018 revealed a 0.6 x 0.7 x 0.7 cm mass at the 12:30 position 3 cm from the nipple.  Ultrasound guided biopsy right breast biopsy and clip placement on 02/27/2018 revealed a hypocellular, circumscribed hyalanized nodule with no atypia or malignancy.  Bone density study on 09/25/2016 was normal with a T score of 0.4 in the left femoral neck.  Bone density was normal on 09/28/2018 with a T score of 0.7 in the AP spine L1-L2, 0.1 in the left femoral neck, and 0.9 in the right forearm radius.  She has a monoclonal gammopathy of unknown significance (MGUS).  SPEP on 12/11/2016 revealed a 0.4 gm/dL monoclonal protein.  She has mild renal insufficiency (Cr 1.28; CrCl 45 ml/min).  Work-up on 12/18/2016 revealed an immunofixation with an IgG monoclonal protein with kappa light chain specificity.  IgG was 2081.  Free light chain ratio was 1.28 (normal).  Beta 2 microglobulin was 2.4 (normal).  24 hour urine revealed no monoclonal protein or free light chains.  Bone survey on 01/08/2017 revealed no lytic lesions.  M-spike has been followed (gm/dL):  0.4 on 12/11/2016, 0.6 on 07/16/2017, 0.4 on 01/14/2018, 0.6 on 08/12/2018, and 0.5 on 12/16/2018.  IgG was 2041 on 12/18/2016 and 1926 on 07/16/2017.  Symptomatically, she feels good.  She denies any beast concerns.  Exam is stable.  CA27.29 is normal.  Plan: 1.   Labs today:  CBC with diff, CMP, CA27.29, SPEP. 2.  Stage I left breast cancer Clinically, she is doing well. Exam is normal.  No evidence of recurrent disease. Interval mammogram is normal.   CA27.29 is 22.3 (normal). Continue Femara. 3.   Monoclonal gammopathy of unknown significance (MGUS) SPEP is 0.5 gm/dL. No concerning symptoms. Continue to monitor every 6 months. 4.   Weight loss, improved Weight up 5 pounds. Continue to monitor. 5.   Health maintenance Bone density was normal. Continue calcium and vitamin D supplementation. 6.   RTC in 6 months  for MD assessment and labs (CBC with diff, CMP, CA27.29, SPEP).   Honor Loh, NP 12/16/18, 10:52 AM   I saw and evaluated the patient, participating in the key portions of the service and reviewing pertinent diagnostic studies and records.  I reviewed the nurse practitioner's note and agree with the findings and the plan.  The assessment and plan were discussed with the patient.  Multiple questions were asked by the patient and answered.   Nolon Stalls, MD 12/16/2018,10:52 AM

## 2018-12-17 LAB — CANCER ANTIGEN 27.29: CA 27.29: 22.3 U/mL (ref 0.0–38.6)

## 2018-12-18 LAB — MULTIPLE MYELOMA PANEL, SERUM
Albumin SerPl Elph-Mcnc: 3.4 g/dL (ref 2.9–4.4)
Albumin/Glob SerPl: 1 (ref 0.7–1.7)
Alpha 1: 0.2 g/dL (ref 0.0–0.4)
Alpha2 Glob SerPl Elph-Mcnc: 0.7 g/dL (ref 0.4–1.0)
B-Globulin SerPl Elph-Mcnc: 1.1 g/dL (ref 0.7–1.3)
Gamma Glob SerPl Elph-Mcnc: 1.7 g/dL (ref 0.4–1.8)
Globulin, Total: 3.7 g/dL (ref 2.2–3.9)
IgA: 213 mg/dL (ref 64–422)
IgG (Immunoglobin G), Serum: 1919 mg/dL — ABNORMAL HIGH (ref 700–1600)
IgM (Immunoglobulin M), Srm: 46 mg/dL (ref 26–217)
M Protein SerPl Elph-Mcnc: 0.5 g/dL — ABNORMAL HIGH
Total Protein ELP: 7.1 g/dL (ref 6.0–8.5)

## 2019-06-14 NOTE — Progress Notes (Signed)
[facility]  [address] [place], [place] [address] Phone: [phone]  Fax: [phone]   Clinic Day:  06/16/2019  Referring physician: Sallee Lange, *  Chief Complaint: Erica Jackson is a 85 y.o. female with stage I left breast cancer and a monoclonal gammopathy of unknown significance (MGUS) who is seen for 6 month assessment on Femara.    HPI: The patient was last seen in the medical oncology clinic on 12/16/2018. At that time, she felt good. She denied any beast concerns. Exam was stable. SPEP was 0.5 gm/dL. CA27.29 was normal (22.3). She continued on Femara.   During the interim, she is doing "fine." She notes an occasional sharp pain in her left axilla. She notes lifting heavy objects (10-20lbs) often, as her husband is unable to lift. Her weight is down 5 lbs in the clinic today. She denies nausea, vomiting or diarrhea. She continues to have joint stiffness in her hands, knees, and right hip.   She denies any breast concerns. She performs monthly breast exams. She continues on Femara and denies any concerns. She continues on calcium and Vitamin D.    Past Medical History:  Diagnosis Date   Breast cancer ([place]) 2017   Lower inner quadrant left breast with lumpectomy and rad tx   Breast cancer, left breast ([place]) 08/04/2016   Overview:  0.9 cm grade II invasive mammary carcinoma. Tumor was ER positive (> 90%), PR negative, and Her2 2+ (FISH negative)   Cataract    Dysrhythmia    no problems in a long time   GERD (gastroesophageal reflux disease)    Headache    Hypercholesterolemia    Hypertension    Malignant neoplasm of upper-inner quadrant of left breast in female, estrogen receptor positive ([place])    Personal history of radiation therapy 11/2016   for left breast ca    Past Surgical History:  Procedure Laterality Date   ABDOMINAL HYSTERECTOMY     BREAST BIOPSY Left 08/28/2016   invasive mammary ca   BREAST  BIOPSY Right 08/2017   HYALINIZED STROMA   BREAST BIOPSY Right 03/2018   HYPOCELLULAR HYALINIZED NODULE   BREAST EXCISIONAL BIOPSY Left 09/17/2016   lt breast ca   BREAST LUMPECTOMY Left 09/2016   invasive mammary carcinoma with clear margins and rad tx   BREAST LUMPECTOMY WITH SENTINEL LYMPH NODE BIOPSY Left 09/17/2016   Procedure: BREAST LUMPECTOMY WITH SENTINEL LYMPH NODE BX;  Surgeon: Clayburn Pert, MD;  Location: [facility] ORS;  Service: General;  Laterality: Left;   EYE SURGERY Left 2015   OOPHORECTOMY      Family History  Problem Relation Age of Onset   Stroke Mother    Cancer Sister    Cancer Brother    Cancer Daughter    Cancer Brother    Cancer Brother    Cancer Brother    Cancer Brother    Cancer Brother    Breast cancer Neg Hx     Social History:  reports that she quit smoking about 29 years ago. She smoked 1.00 pack per day. She quit smokeless tobacco use about 29 years ago. She reports that she does not drink alcohol or use drugs. The patient previously smoked 1 pack/week for 5-10 years. She stopped smoking in 1990. Her daughter, [name], is a Software engineer. She has been fishing. The patient is alone today.   Allergies: No Known Allergies  Current Medications: Current Outpatient Medications  Medication Sig Dispense Refill   aspirin  EC 81 MG tablet Take 81 mg by mouth daily.      atenolol (TENORMIN) 25 MG tablet TAKE ONE TABLET BY MOUTH ONCE DAILY     Cyanocobalamin (B-12) 1000 MCG TABS Take by mouth.     felodipine (PLENDIL) 5 MG 24 hr tablet Take 5 mg by mouth daily.      isosorbide dinitrate (ISORDIL) 20 MG tablet Take 20 mg by mouth 2 (two) times daily.      letrozole (FEMARA) 2.5 MG tablet TAKE 1 TABLET BY MOUTH ONCE DAILY 90 tablet 0   lovastatin (MEVACOR) 10 MG tablet TAKE ONE TABLET BY MOUTH ONCE DAILY WITH DINNER     omeprazole (PRILOSEC) 20 MG capsule Take 20 mg by mouth daily.      potassium chloride SA (K-DUR,KLOR-CON) 20 MEQ  tablet Take 20 mEq by mouth daily.      acetaminophen (TYLENOL) 650 MG CR tablet Take 1 tablet by mouth every 8 (eight) hours.     No current facility-administered medications for this visit.     Review of Systems  Constitutional: Positive for weight loss (5 lbs). Negative for chills, diaphoresis, fever and malaise/fatigue.       Doing "fine."  HENT: Negative.  Negative for congestion, ear pain, hearing loss, nosebleeds, sinus pain and sore throat.   Eyes: Negative.  Negative for blurred vision, double vision, photophobia, pain and redness.  Respiratory: Negative.  Negative for cough, hemoptysis, sputum production and shortness of breath.   Cardiovascular: Negative.  Negative for chest pain, palpitations, orthopnea, leg swelling and PND.  Gastrointestinal: Negative.  Negative for abdominal pain, blood in stool, constipation, diarrhea, heartburn, melena, nausea and vomiting.  Genitourinary: Negative.  Negative for dysuria, frequency, hematuria and urgency.  Musculoskeletal: Positive for joint pain (OA; stiffness in hands, knees, right hip). Negative for back pain, falls, myalgias and neck pain.       Pain in left axilla  Skin: Negative.  Negative for itching and rash.  Neurological: Negative.  Negative for dizziness, tremors, sensory change, speech change, focal weakness, weakness and headaches.  Endo/Heme/Allergies: Negative.  Does not bruise/bleed easily.  Psychiatric/Behavioral: Negative.  Negative for depression and memory loss. The patient is not nervous/anxious and does not have insomnia.   All other systems reviewed and are negative.  Performance status (ECOG): 1  Vitals Blood pressure 111/67, pulse (!) 54, temperature 97.9 F (36.6 C), temperature source Tympanic, resp. rate 18, height 5' 5"  (1.651 m), weight 163 lb 2.3 oz (74 kg), SpO2 100 %.   Physical Exam  Constitutional: She is oriented to person, place, and time. She appears well-developed and well-nourished. No distress.    HENT:  Head: Normocephalic and atraumatic.  Mouth/Throat: Oropharynx is clear and moist. No oropharyngeal exudate.  Long gray braided hair. Wearing a mask.  Eyes: Pupils are equal, round, and reactive to light. Conjunctivae and EOM are normal. No scleral icterus.  Glasses.  Brown eyes.  Neck: Normal range of motion. Neck supple.  Cardiovascular: Normal rate, regular rhythm and normal heart sounds.  No murmur heard. Pulmonary/Chest: Effort normal and breath sounds normal. No respiratory distress. She has no wheezes. Right breast exhibits no inverted nipple, no mass, no nipple discharge, no skin change (upper outer fibrocystic changes) and no tenderness. Left breast exhibits no inverted nipple, no mass, no nipple discharge, no skin change (inferior post-operative and post radiation changes; slight edema inferiorly) and no tenderness.  Abdominal: Soft. Bowel sounds are normal. She exhibits no distension. There is no abdominal  tenderness.  Musculoskeletal: Normal range of motion.        General: No edema.  Lymphadenopathy:    She has no cervical adenopathy.    She has no axillary adenopathy.       Right: No supraclavicular adenopathy present.       Left: No supraclavicular adenopathy present.  Neurological: She is alert and oriented to person, place, and time.  Skin: Skin is warm and dry. She is not diaphoretic. No erythema.  Psychiatric: She has a normal mood and affect. Her behavior is normal. Judgment and thought content normal.  Nursing note and vitals reviewed.   Appointment on 06/16/2019  Component Date Value Ref Range Status   Sodium 06/16/2019 140  135 - 145 mmol/L Final   Potassium 06/16/2019 4.2  3.5 - 5.1 mmol/L Final   Chloride 06/16/2019 109  98 - 111 mmol/L Final   CO2 06/16/2019 23  22 - 32 mmol/L Final   Glucose, Bld 06/16/2019 121* 70 - 99 mg/dL Final   BUN 06/16/2019 16  8 - 23 mg/dL Final   Creatinine, Ser 06/16/2019 1.41* 0.44 - 1.00 mg/dL Final   Calcium  06/16/2019 9.1  8.9 - 10.3 mg/dL Final   Total Protein 06/16/2019 7.7  6.5 - 8.1 g/dL Final   Albumin 06/16/2019 3.6  3.5 - 5.0 g/dL Final   AST 06/16/2019 13* 15 - 41 U/L Final   ALT 06/16/2019 10  0 - 44 U/L Final   Alkaline Phosphatase 06/16/2019 91  38 - 126 U/L Final   Total Bilirubin 06/16/2019 0.3  0.3 - 1.2 mg/dL Final   GFR calc non Af Amer 06/16/2019 34* >60 mL/min Final   GFR calc Af Amer 06/16/2019 40* >60 mL/min Final   Anion gap 06/16/2019 8  5 - 15 Final   Performed at [facility], [address], [place], [place] [address]   WBC 06/16/2019 6.7  4.0 - 10.5 K/uL Final   RBC 06/16/2019 3.73* 3.87 - 5.11 MIL/uL Final   Hemoglobin 06/16/2019 11.1* 12.0 - 15.0 g/dL Final   HCT 06/16/2019 34.8* 36.0 - 46.0 % Final   MCV 06/16/2019 93.3  80.0 - 100.0 fL Final   MCH 06/16/2019 29.8  26.0 - 34.0 pg Final   MCHC 06/16/2019 31.9  30.0 - 36.0 g/dL Final   RDW 06/16/2019 14.6  11.5 - 15.5 % Final   Platelets 06/16/2019 203  150 - 400 K/uL Final   nRBC 06/16/2019 0.0  0.0 - 0.2 % Final   Neutrophils Relative % 06/16/2019 71  % Final   Neutro Abs 06/16/2019 4.6  1.7 - 7.7 K/uL Final   Lymphocytes Relative 06/16/2019 23  % Final   Lymphs Abs 06/16/2019 1.5  0.7 - 4.0 K/uL Final   Monocytes Relative 06/16/2019 6  % Final   Monocytes Absolute 06/16/2019 0.4  0.1 - 1.0 K/uL Final   Eosinophils Relative 06/16/2019 0  % Final   Eosinophils Absolute 06/16/2019 0.0  0.0 - 0.5 K/uL Final   Basophils Relative 06/16/2019 0  % Final   Basophils Absolute 06/16/2019 0.0  0.0 - 0.1 K/uL Final   Immature Granulocytes 06/16/2019 0  % Final   Abs Immature Granulocytes 06/16/2019 0.02  0.00 - 0.07 K/uL Final   Performed at [facility], [address] [place], [place] [address]    Assessment:  Erica Jackson is a 85 y.o. female with stage I left breast cancer s/p lumpectomy and sentinel lymph node biopsy  on 09/17/2016.  Pathology revealed a  1.0 cm grade II invasive mammary carcinoma of no special type.  Margins were negative.  There was no tumor in 1 sentinel lymph node.  Pathologic stage was T1bN0M0.  Mammogram and ultrasound on 08/16/2016 revealed a 1.1 x 0.6 x 0.8 cm mass at the 10 o'clock position.  Left breast biopsy on 08/28/2016 revealed a 0.9 cm grade II invasive mammary carcinoma.  Tumor was ER positive (> 90%), PR negative, and Her2 2+ (negative by FISH).    She completed radiation on 11/28/2016.  She began Femara on 12/11/2016.  CA27.29 has been followed: 21.7 on 09/11/2016, 20.9 on 04/09/2017, 21.3 on 07/16/2017, 22 on 10/15/2017, 26.4 on 01/14/2018, 24.1 on 04/15/2018, 21.8 on 08/12/2018, 22.3 on 12/16/2018, and 20.3 on 06/16/2019.  Bilateral diagnostic mammogram on 08/26/2018 revealed no evidence of malignancy.  Right sided mammogram and ultrasound on 02/19/2018 revealed a 0.6 x 0.7 x 0.7 cm mass at the 12:30 position 3 cm from the nipple.  Ultrasound guided biopsy right breast biopsy and clip placement on 02/27/2018 revealed a hypocellular, circumscribed hyalanized nodule with no atypia or malignancy.  Bone density study on 09/25/2016 was normal with a T score of 0.4 in the left femoral neck.  Bone density was normal on 09/28/2018 with a T score of 0.7 in the AP spine L1-L2, 0.1 in the left femoral neck, and 0.9 in the right forearm radius.  She has a monoclonal gammopathy of unknown significance (MGUS).  SPEP on 12/11/2016 revealed a 0.4 gm/dL monoclonal protein.  She has mild renal insufficiency (Cr 1.28; CrCl 45 ml/min).  Work-up on 12/18/2016 revealed an immunofixation with an IgG monoclonal protein with kappa light chain specificity.  IgG was 2081.  Free light chain ratio was 1.28 (normal).  Beta 2 microglobulin was 2.4 (normal).  24 hour urine revealed no monoclonal protein or free light chains.  Bone survey on 01/08/2017 revealed no lytic lesions.  M-spike has been followed (gm/dL):  0.4 on 12/11/2016, 0.6  on 07/16/2017, 0.4 on 01/14/2018, 0.6 on 08/12/2018, and 0.5 on 12/16/2018.  IgG was 2041 on 12/18/2016 and 1926 on 07/16/2017.  Symptomatically, she is doing well.  She notes intermittent left axillae pain.  Weight is down 5 pounds. Exam is stable.  Plan: 1.   Labs today:  CBC with diff, CMP, SPEP, CA27.29. 2.   Stage I left breast cancer Clinically, she is doing well.   Exam reveals no evidence of recurrent disease.   Biateral mammogram on 08/30/2019. CA 27.29 is normal. Continue Femara. 3.   Monoclonal gammopathy of unknown significance (MGUS) Clinically, she is doing well.   SPEP is 0.5 gm/dL (stable). Continue to monitor every 6 months. 4.   Weight loss Weight is back down 5 pounds. Patient to weigh self at home and contact clinic if continues to lose weight. Weight check in 3 months. 5.     RTC in 6 months for MD assessment, labs (CBC with diff, CMP, CA 27.29, SPEP).  I discussed the assessment and treatment plan with the patient.  The patient was provided an opportunity to ask questions and all were answered.  The patient agreed with the plan and demonstrated an understanding of the instructions.  The patient was advised to call back if the symptoms worsen or if the condition fails to improve as anticipated.   Lequita Asal, MD, PhD    06/16/2019, 11:50 AM  I, Valentino Nose acting as Education administrator for [employer]. Mike Gip, MD, PhD.  I, Emily Forse C. Mike Gip, MD, have reviewed the above documentation for accuracy and completeness, and I agree with the above.

## 2019-06-16 ENCOUNTER — Inpatient Hospital Stay: Payer: Medicare Other | Attending: Hematology and Oncology | Admitting: Hematology and Oncology

## 2019-06-16 ENCOUNTER — Other Ambulatory Visit: Payer: Self-pay

## 2019-06-16 ENCOUNTER — Inpatient Hospital Stay: Payer: Medicare Other

## 2019-06-16 ENCOUNTER — Encounter: Payer: Self-pay | Admitting: Hematology and Oncology

## 2019-06-16 VITALS — BP 111/67 | HR 54 | Temp 97.9°F | Resp 18 | Ht 65.0 in | Wt 163.1 lb

## 2019-06-16 DIAGNOSIS — Z79811 Long term (current) use of aromatase inhibitors: Secondary | ICD-10-CM | POA: Insufficient documentation

## 2019-06-16 DIAGNOSIS — C50312 Malignant neoplasm of lower-inner quadrant of left female breast: Secondary | ICD-10-CM

## 2019-06-16 DIAGNOSIS — E7800 Pure hypercholesterolemia, unspecified: Secondary | ICD-10-CM | POA: Diagnosis not present

## 2019-06-16 DIAGNOSIS — Z170 Estrogen receptor positive status [ER+]: Secondary | ICD-10-CM

## 2019-06-16 DIAGNOSIS — Z79899 Other long term (current) drug therapy: Secondary | ICD-10-CM | POA: Diagnosis not present

## 2019-06-16 DIAGNOSIS — D472 Monoclonal gammopathy: Secondary | ICD-10-CM

## 2019-06-16 DIAGNOSIS — Z803 Family history of malignant neoplasm of breast: Secondary | ICD-10-CM | POA: Diagnosis not present

## 2019-06-16 DIAGNOSIS — Z923 Personal history of irradiation: Secondary | ICD-10-CM | POA: Diagnosis not present

## 2019-06-16 DIAGNOSIS — I10 Essential (primary) hypertension: Secondary | ICD-10-CM | POA: Insufficient documentation

## 2019-06-16 DIAGNOSIS — Z87891 Personal history of nicotine dependence: Secondary | ICD-10-CM | POA: Insufficient documentation

## 2019-06-16 DIAGNOSIS — R634 Abnormal weight loss: Secondary | ICD-10-CM

## 2019-06-16 DIAGNOSIS — C50912 Malignant neoplasm of unspecified site of left female breast: Secondary | ICD-10-CM | POA: Insufficient documentation

## 2019-06-16 DIAGNOSIS — C50212 Malignant neoplasm of upper-inner quadrant of left female breast: Secondary | ICD-10-CM

## 2019-06-16 DIAGNOSIS — Z7982 Long term (current) use of aspirin: Secondary | ICD-10-CM | POA: Insufficient documentation

## 2019-06-16 LAB — CBC WITH DIFFERENTIAL/PLATELET
Abs Immature Granulocytes: 0.02 10*3/uL (ref 0.00–0.07)
Basophils Absolute: 0 10*3/uL (ref 0.0–0.1)
Basophils Relative: 0 %
Eosinophils Absolute: 0 10*3/uL (ref 0.0–0.5)
Eosinophils Relative: 0 %
HCT: 34.8 % — ABNORMAL LOW (ref 36.0–46.0)
Hemoglobin: 11.1 g/dL — ABNORMAL LOW (ref 12.0–15.0)
Immature Granulocytes: 0 %
Lymphocytes Relative: 23 %
Lymphs Abs: 1.5 10*3/uL (ref 0.7–4.0)
MCH: 29.8 pg (ref 26.0–34.0)
MCHC: 31.9 g/dL (ref 30.0–36.0)
MCV: 93.3 fL (ref 80.0–100.0)
Monocytes Absolute: 0.4 10*3/uL (ref 0.1–1.0)
Monocytes Relative: 6 %
Neutro Abs: 4.6 10*3/uL (ref 1.7–7.7)
Neutrophils Relative %: 71 %
Platelets: 203 10*3/uL (ref 150–400)
RBC: 3.73 MIL/uL — ABNORMAL LOW (ref 3.87–5.11)
RDW: 14.6 % (ref 11.5–15.5)
WBC: 6.7 10*3/uL (ref 4.0–10.5)
nRBC: 0 % (ref 0.0–0.2)

## 2019-06-16 LAB — COMPREHENSIVE METABOLIC PANEL
ALT: 10 U/L (ref 0–44)
AST: 13 U/L — ABNORMAL LOW (ref 15–41)
Albumin: 3.6 g/dL (ref 3.5–5.0)
Alkaline Phosphatase: 91 U/L (ref 38–126)
Anion gap: 8 (ref 5–15)
BUN: 16 mg/dL (ref 8–23)
CO2: 23 mmol/L (ref 22–32)
Calcium: 9.1 mg/dL (ref 8.9–10.3)
Chloride: 109 mmol/L (ref 98–111)
Creatinine, Ser: 1.41 mg/dL — ABNORMAL HIGH (ref 0.44–1.00)
GFR calc Af Amer: 40 mL/min — ABNORMAL LOW (ref >60)
GFR calc non Af Amer: 34 mL/min — ABNORMAL LOW (ref >60)
Glucose, Bld: 121 mg/dL — ABNORMAL HIGH (ref 70–99)
Potassium: 4.2 mmol/L (ref 3.5–5.1)
Sodium: 140 mmol/L (ref 135–145)
Total Bilirubin: 0.3 mg/dL (ref 0.3–1.2)
Total Protein: 7.7 g/dL (ref 6.5–8.1)

## 2019-06-17 LAB — CANCER ANTIGEN 27.29: CA 27.29: 20.3 U/mL (ref 0.0–38.6)

## 2019-06-18 LAB — PROTEIN ELECTROPHORESIS, SERUM
A/G Ratio: 0.9 (ref 0.7–1.7)
Albumin ELP: 3.4 g/dL (ref 2.9–4.4)
Alpha-1-Globulin: 0.2 g/dL (ref 0.0–0.4)
Alpha-2-Globulin: 0.7 g/dL (ref 0.4–1.0)
Beta Globulin: 1 g/dL (ref 0.7–1.3)
Gamma Globulin: 1.6 g/dL (ref 0.4–1.8)
Globulin, Total: 3.6 g/dL (ref 2.2–3.9)
M-Spike, %: 0.5 g/dL — ABNORMAL HIGH
Total Protein ELP: 7 g/dL (ref 6.0–8.5)

## 2019-06-22 DIAGNOSIS — R634 Abnormal weight loss: Secondary | ICD-10-CM | POA: Insufficient documentation

## 2019-06-29 ENCOUNTER — Other Ambulatory Visit: Payer: Self-pay

## 2019-06-29 DIAGNOSIS — C50312 Malignant neoplasm of lower-inner quadrant of left female breast: Secondary | ICD-10-CM

## 2019-07-02 ENCOUNTER — Other Ambulatory Visit: Payer: Self-pay

## 2019-07-05 ENCOUNTER — Other Ambulatory Visit: Payer: Self-pay

## 2019-07-05 ENCOUNTER — Encounter: Payer: Self-pay | Admitting: Radiation Oncology

## 2019-07-05 ENCOUNTER — Ambulatory Visit
Admission: RE | Admit: 2019-07-05 | Discharge: 2019-07-05 | Disposition: A | Discharge status: Home / Self Care | Payer: Medicare Other | Source: Ambulatory Visit | Attending: Radiation Oncology | Admitting: Radiation Oncology

## 2019-07-05 VITALS — BP 138/61 | HR 60 | Temp 97.8°F | Resp 18 | Wt 163.9 lb

## 2019-07-05 DIAGNOSIS — Z79811 Long term (current) use of aromatase inhibitors: Secondary | ICD-10-CM | POA: Diagnosis not present

## 2019-07-05 DIAGNOSIS — C50312 Malignant neoplasm of lower-inner quadrant of left female breast: Secondary | ICD-10-CM | POA: Diagnosis present

## 2019-07-05 DIAGNOSIS — Z923 Personal history of irradiation: Secondary | ICD-10-CM | POA: Diagnosis not present

## 2019-07-05 DIAGNOSIS — Z170 Estrogen receptor positive status [ER+]: Secondary | ICD-10-CM | POA: Diagnosis not present

## 2019-07-05 NOTE — Progress Notes (Signed)
Radiation Oncology Follow up Note  Name: Erica Jackson   Date:   07/05/2019 MRN:  [id number] DOB: 06-08-1934    This 85 y.o. female presents to the clinic today for 2-1/2-year follow-up status post whole breast radiation to left breast for stage I ER positive invasive mammary carcinoma.  REFERRING PROVIDER: Sallee Lange, *  HPI: Patient is an 85 year old female now about 2-1/2 years having completed whole breast radiation to her left breast for stage I ER positive PR negative invasive mammary carcinoma seen today in routine follow-up she is doing well.  She specifically denies breast tenderness cough or bone pain.Marland Kitchen  Her last mammogram was back in October was BI-RADS 2 benign which I have reviewed.  She is currently on Femara tolerating that well without side effect.  COMPLICATIONS OF TREATMENT: none  FOLLOW UP COMPLIANCE: keeps appointments   PHYSICAL EXAM:  BP 138/61 (BP Location: Left Arm, Patient Position: Sitting)   Pulse 60   Temp 97.8 F (36.6 C) (Tympanic)   Resp 18   Wt 163 lb 14.4 oz (74.3 kg)   BMI 27.27 kg/m  Lungs are clear to A&P cardiac examination essentially unremarkable with regular rate and rhythm. No dominant mass or nodularity is noted in either breast in 2 positions examined. Incision is well-healed. No axillary or supraclavicular adenopathy is appreciated. Cosmetic result is excellent.  Well-developed well-nourished patient in NAD. HEENT reveals PERLA, EOMI, discs not visualized.  Oral cavity is clear. No oral mucosal lesions are identified. Neck is clear without evidence of cervical or supraclavicular adenopathy. Lungs are clear to A&P. Cardiac examination is essentially unremarkable with regular rate and rhythm without murmur rub or thrill. Abdomen is benign with no organomegaly or masses noted. Motor sensory and DTR levels are equal and symmetric in the upper and lower extremities. Cranial nerves II through XII are grossly intact. Proprioception is intact. No  peripheral adenopathy or edema is identified. No motor or sensory levels are noted. Crude visual fields are within normal range.  RADIOLOGY RESULTS: Mammograms reviewed compatible with above-stated findings  PLAN: Present time patient is now 2 and half years out with no evidence of disease she is doing well.  She continues on Femara without side effect.  She is already scheduled for follow-up mammograms this October.  I will see her back in 1 year for follow-up.  Patient knows to call with any concerns.  I would like to take this opportunity to thank you for allowing me to participate in the care of your patient.Noreene Filbert, MD

## 2019-08-30 ENCOUNTER — Ambulatory Visit
Admission: RE | Admit: 2019-08-30 | Discharge: 2019-08-30 | Disposition: A | Discharge status: Home / Self Care | Payer: Medicare Other | Source: Ambulatory Visit | Attending: General Surgery | Admitting: General Surgery

## 2019-08-30 DIAGNOSIS — Z170 Estrogen receptor positive status [ER+]: Secondary | ICD-10-CM

## 2019-08-30 DIAGNOSIS — C50312 Malignant neoplasm of lower-inner quadrant of left female breast: Secondary | ICD-10-CM

## 2019-09-07 ENCOUNTER — Other Ambulatory Visit: Payer: Self-pay

## 2019-09-07 ENCOUNTER — Ambulatory Visit (INDEPENDENT_AMBULATORY_CARE_PROVIDER_SITE_OTHER): Payer: Medicare Other | Admitting: General Surgery

## 2019-09-07 ENCOUNTER — Encounter: Payer: Self-pay | Admitting: General Surgery

## 2019-09-07 VITALS — BP 135/70 | HR 67 | Temp 97.7°F | Resp 14 | Ht 64.0 in | Wt 158.0 lb

## 2019-09-07 DIAGNOSIS — Z853 Personal history of malignant neoplasm of breast: Secondary | ICD-10-CM | POA: Diagnosis not present

## 2019-09-07 NOTE — Patient Instructions (Addendum)
Follow-up with our office as needed.   Please call and ask to speak with a nurse if you develop questions or concerns.  Breast Self-Awareness Breast self-awareness is knowing how your breasts look and feel. Doing breast self-awareness is important. It allows you to catch a breast problem early while it is still small and can be treated. All women should do breast self-awareness, including women who have had breast implants. Tell your doctor if you notice a change in your breasts. What you need:  A mirror.  A well-lit room. How to do a breast self-exam A breast self-exam is one way to learn what is normal for your breasts and to check for changes. To do a breast self-exam: Look for changes  1. Take off all the clothes above your waist. 2. Stand in front of a mirror in a room with good lighting. 3. Put your hands on your hips. 4. Push your hands down. 5. Look at your breasts and nipples in the mirror to see if one breast or nipple looks different from the other. Check to see if: ? The shape of one breast is different. ? The size of one breast is different. ? There are wrinkles, dips, and bumps in one breast and not the other. 6. Look at each breast for changes in the skin, such as: ? Redness. ? Scaly areas. 7. Look for changes in your nipples, such as: ? Liquid around the nipples. ? Bleeding. ? Dimpling. ? Redness. ? A change in where the nipples are. Feel for changes  1. Lie on your back on the floor. 2. Feel each breast. To do this, follow these steps: ? Pick a breast to feel. ? Put the arm closest to that breast above your head. ? Use your other arm to feel the nipple area of your breast. Feel the area with the pads of your three middle fingers by making small circles with your fingers. For the first circle, press lightly. For the second circle, press harder. For the third circle, press even harder. ? Keep making circles with your fingers at the different pressures as you move  down your breast. Stop when you feel your ribs. ? Move your fingers a little toward the center of your body. ? Start making circles with your fingers again, this time going up until you reach your collarbone. ? Keep making up-and-down circles until you reach your armpit. Remember to keep using the three pressures. ? Feel the other breast in the same way. 3. Sit or stand in the tub or shower. 4. With soapy water on your skin, feel each breast the same way you did in step 2 when you were lying on the floor. Write down what you find Writing down what you find can help you remember what to tell your doctor. Write down:  What is normal for each breast.  Any changes you find in each breast, including: ? The kind of changes you find. ? Whether you have pain. ? Size and location of any lumps.  When you last had your menstrual period. General tips  Check your breasts every month.  If you are breastfeeding, the best time to check your breasts is after you feed your baby or after you use a breast pump.  If you get menstrual periods, the best time to check your breasts is 5-7 days after your menstrual period is over.  With time, you will become comfortable with the self-exam, and you will begin to know if  there are changes in your breasts. Contact a doctor if you:  See a change in the shape or size of your breasts or nipples.  See a change in the skin of your breast or nipples, such as red or scaly skin.  Have fluid coming from your nipples that is not normal.  Find a lump or thick area that was not there before.  Have pain in your breasts.  Have any concerns about your breast health. Summary  Breast self-awareness includes looking for changes in your breasts, as well as feeling for changes within your breasts.  Breast self-awareness should be done in front of a mirror in a well-lit room.  You should check your breasts every month. If you get menstrual periods, the best time to  check your breasts is 5-7 days after your menstrual period is over.  Let your doctor know of any changes you see in your breasts, including changes in size, changes on the skin, pain or tenderness, or fluid from your nipples that is not normal. This information is not intended to replace advice given to you by your health care provider. Make sure you discuss any questions you have with your health care provider. Document Released: 04/08/2008 Document Revised: 06/09/2018 Document Reviewed: 06/09/2018 Elsevier Patient Education  2020 [employer].

## 2019-09-07 NOTE — Progress Notes (Signed)
Patient ID: Erica Jackson, female   DOB: 1933/11/20, 85 y.o.   MRN: [id number]  Chief Complaint  Patient presents with  . Follow-up    HPI Erica Jackson is a 85 y.o. female.   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  The tumor was ER positive, PR negative, and HER-2 positive.  She had a lumpectomy and sentinel node biopsy followed by radiation.  Her surgery and treatment was in 2017.  She continues to be followed in the cancer center by Dr. Mike Gip.  She was last seen in general surgery 1 year ago, at which time she had no evidence of recurrent disease on physical examination or on mammogram.  Today, she is here for follow-up after undergoing a mammogram.  She denies any new lumps or bumps in her breasts.  No skin changes or nipple discharge.  She complains of some discomfort in her left axilla, at the site of her sentinel node biopsy.  This has been chronic and is not a new finding.   Past Medical History:  Diagnosis Date  . Breast cancer ([place]) 2017   Lower inner quadrant left breast with lumpectomy and rad tx  . Breast cancer, left breast ([place]) 08/04/2016   Overview:  0.9 cm grade II invasive mammary carcinoma. Tumor was ER positive (> 90%), PR negative, and Her2 2+ (FISH negative)  . Cataract   . Dysrhythmia    no problems in a long time  . GERD (gastroesophageal reflux disease)   . Headache   . Hypercholesterolemia   . Hypertension   . Malignant neoplasm of upper-inner quadrant of left breast in female, estrogen receptor positive ([place])   . Personal history of radiation therapy 11/2016   for left breast ca    Past Surgical History:  Procedure Laterality Date  . ABDOMINAL HYSTERECTOMY    . BREAST BIOPSY Left 08/28/2016   invasive mammary ca  . BREAST BIOPSY Right 08/2017   HYALINIZED STROMA  . BREAST BIOPSY Right 03/2018   HYPOCELLULAR HYALINIZED NODULE  . BREAST EXCISIONAL BIOPSY Left 09/17/2016   lt breast ca  . BREAST LUMPECTOMY Left  09/2016   invasive mammary carcinoma with clear margins and rad tx  . BREAST LUMPECTOMY WITH SENTINEL LYMPH NODE BIOPSY Left 09/17/2016   Procedure: BREAST LUMPECTOMY WITH SENTINEL LYMPH NODE BX;  Surgeon: Clayburn Pert, MD;  Location: [facility] ORS;  Service: General;  Laterality: Left;  . EYE SURGERY Left 2015  . OOPHORECTOMY      Family History  Problem Relation Age of Onset  . Stroke Mother   . Cancer Sister   . Cancer Brother   . Cancer Daughter   . Cancer Brother   . Cancer Brother   . Cancer Brother   . Cancer Brother   . Cancer Brother   . Breast cancer Neg Hx     Social History Social History   Tobacco Use  . Smoking status: Former Smoker    Packs/day: 1.00    Quit date: 09/09/1989    Years since quitting: 30.0  . Smokeless tobacco: Former Systems developer    Quit date: 09/09/1989  Substance Use Topics  . Alcohol use: No  . Drug use: No    No Known Allergies  Current Outpatient Medications  Medication Sig Dispense Refill  . acetaminophen (TYLENOL) 650 MG CR tablet Take 1 tablet by mouth every 8 (eight) hours.    Marland Kitchen aspirin EC 81 MG tablet Take 81 mg by  mouth daily.     Marland Kitchen atenolol (TENORMIN) 25 MG tablet TAKE ONE TABLET BY MOUTH ONCE DAILY    . Cyanocobalamin (B-12) 1000 MCG TABS Take by mouth.    . felodipine (PLENDIL) 5 MG 24 hr tablet Take 5 mg by mouth daily.     . isosorbide dinitrate (ISORDIL) 20 MG tablet Take 20 mg by mouth 2 (two) times daily.     Marland Kitchen letrozole (FEMARA) 2.5 MG tablet TAKE 1 TABLET BY MOUTH ONCE DAILY 90 tablet 0  . lovastatin (MEVACOR) 10 MG tablet TAKE ONE TABLET BY MOUTH ONCE DAILY WITH DINNER    . omeprazole (PRILOSEC) 20 MG capsule Take 20 mg by mouth daily.     . potassium chloride SA (K-DUR,KLOR-CON) 20 MEQ tablet Take 20 mEq by mouth daily.      No current facility-administered medications for this visit.     Review of Systems Review of Systems  All other systems reviewed and are negative.   Blood pressure 135/70, pulse 67, temperature  97.7 F (36.5 C), resp. rate 14, height 5' 4" (1.626 m), weight 158 lb (71.7 kg), SpO2 98 %.  Physical Exam Physical Exam Vitals signs reviewed. Exam conducted with a chaperone present.  Constitutional:      General: She is not in acute distress.    Appearance: Normal appearance.  HENT:     Head: Normocephalic and atraumatic.     Nose:     Comments: Covered with a mask secondary to COVID-19 precautions    Mouth/Throat:     Comments: Covered with a mask secondary to COVID-19 precautions Eyes:     General: No scleral icterus.       Right eye: No discharge.        Left eye: No discharge.  Cardiovascular:     Rate and Rhythm: Normal rate and regular rhythm.     Pulses: Normal pulses.  Pulmonary:     Effort: Pulmonary effort is normal.     Breath sounds: Normal breath sounds.  Chest:     Breasts:        Right: Normal.        Left: Normal.       Comments: The breast tissue is dense and fibroglandular.  No masses are appreciated.  She has numerous seborrheic proptosis on the skin and a mole adjacent to her left nipple.  There is a scar just cranial to the left nipple from her prior back to me.  There is some fibrous scar tissue in the left axilla, but no lymphadenopathy is identified. Abdominal:     General: Bowel sounds are normal.     Palpations: Abdomen is soft.  Lymphadenopathy:     Cervical: No cervical adenopathy.     Upper Body:     Right upper body: No supraclavicular, axillary or pectoral adenopathy.     Left upper body: No supraclavicular, axillary or pectoral adenopathy.  Neurological:     Mental Status: She is alert.     Data Reviewed The patient's most recent mammogram was performed on August 30, 2019.  This was personally reviewed.  The radiologist interpretation is copied here:  CLINICAL DATA:  LEFT lumpectomy with radiation therapy 2017.  EXAM: DIGITAL DIAGNOSTIC BILATERAL MAMMOGRAM WITH CAD AND TOMO  COMPARISON:  08/26/2018 and earlier  ACR Breast  Density Category c: The breast tissue is heterogeneously dense, which may obscure small masses.  FINDINGS: Post operative changes are seen in the LEFTbreast. No suspicious mass, distortion, or microcalcifications  are identified to suggest presence of malignancy.  Mammographic images were processed with CAD.  IMPRESSION: No mammographic evidence for malignancy.  RECOMMENDATION: Diagnostic mammogram is suggested in 1 year. (Code:DM-B-01Y)  I have discussed the findings and recommendations with the patient. If applicable, a reminder letter will be sent to the patient regarding the next appointment.  BI-RADS CATEGORY  2: Benign.  Assessment This is an 85 year old woman with a history of left breast cancer, status post excision and radiation treatment.  She is doing well and has had no evidence of recurrent disease.  Plan At this time, she does not require additional follow-up in the general surgery clinic.  She should continue to perform breast self exams on a monthly basis and have annual diagnostic mammograms.  We will see her on an as-needed basis.    Fredirick Maudlin 09/07/2019, 10:50 AM

## 2019-09-16 ENCOUNTER — Inpatient Hospital Stay: Payer: Medicare Other | Attending: Hematology and Oncology

## 2019-12-18 NOTE — Progress Notes (Signed)
[facility]  [address], [address] [place], [place] [address] Phone: [phone]  Fax: [phone]   Clinic Day:  12/21/2019  Referring physician: Sallee Lange, *  Chief Complaint: Erica Jackson is a 86 y.o. female with stage I left breast cancer and a monoclonal gammopathy of unknown significance (MGUS) who is seen for a 6 month assessment.   HPI: The patient was last seen in the medical oncology clinic on 06/16/2019. At that time, she was doing well. She noted intermittent left axillae pain. Weight was down 5 pounds. Exam was stable. Hematocrit 34.8, hemoglobin 11.1, platelets 203,000, WBC 6,700. Creatinine was 1.41.  CA 27.29 was 20.3. M spike was 0.5 gm/dL. She continued on Femara.   Patient was seen by Dr. Baruch Gouty on 07/05/2019. She was doing well. She denied any cough, bone pain, or chest tenderness. She has a follow up in 1 year.   Diagnostic bilateral mammogram on 08/30/2019 revealed no evidence for malignancy.   She was seen by Dr. Celine Ahr on 09/07/2019. She denied any adenopathy or breast changes.  She noted some discomfort in her left axilla at the site of her sentinel node biopsy. Patient will follow up as needed.   During the interim, she has felt "good".  She is taking Femara.  She performs self breast exams once a month. She has left and right axillary tenderness.   She continues to have bone and joint pain in the right hip, hands, and feet secondary to arthritis. She notes discomfort in her groin. She reports chest pain that comes and goes. She has occasional reflux. She had never had a cardiac evaluation.   Past Medical History:  Diagnosis Date  . Breast cancer ([place]) 2017   Lower inner quadrant left breast with lumpectomy and rad tx  . Breast cancer, left breast ([place]) 08/04/2016   Overview:  0.9 cm grade II invasive mammary carcinoma. Tumor was ER positive (> 90%), PR negative, and Her2 2+ (FISH negative)  . Cataract   .  Dysrhythmia    no problems in a long time  . GERD (gastroesophageal reflux disease)   . Headache   . Hypercholesterolemia   . Hypertension   . Malignant neoplasm of upper-inner quadrant of left breast in female, estrogen receptor positive ([place])   . Personal history of radiation therapy 11/2016   for left breast ca    Past Surgical History:  Procedure Laterality Date  . ABDOMINAL HYSTERECTOMY    . BREAST BIOPSY Left 08/28/2016   invasive mammary ca  . BREAST BIOPSY Right 08/2017   HYALINIZED STROMA  . BREAST BIOPSY Right 03/2018   HYPOCELLULAR HYALINIZED NODULE  . BREAST EXCISIONAL BIOPSY Left 09/17/2016   lt breast ca  . BREAST LUMPECTOMY Left 09/2016   invasive mammary carcinoma with clear margins and rad tx  . BREAST LUMPECTOMY WITH SENTINEL LYMPH NODE BIOPSY Left 09/17/2016   Procedure: BREAST LUMPECTOMY WITH SENTINEL LYMPH NODE BX;  Surgeon: Clayburn Pert, MD;  Location: [facility] ORS;  Service: General;  Laterality: Left;  . EYE SURGERY Left 2015  . OOPHORECTOMY      Family History  Problem Relation Age of Onset  . Stroke Mother   . Cancer Sister   . Cancer Brother   . Cancer Daughter   . Cancer Brother   . Cancer Brother   . Cancer Brother   . Cancer Brother   . Cancer Brother   . Breast cancer Neg Hx     Social  History:  reports that she quit smoking about 30 years ago. She smoked 1.00 pack per day. She quit smokeless tobacco use about 30 years ago. She reports that she does not drink alcohol or use drugs. The patient previously smoked 1 pack/week for 5-10 years. She stopped smoking in 1990. Her daughter, [name], is a Software engineer. She has been fishing. The patient is accompanied by her daughter, [name], via [place] today.  Allergies: No Known Allergies  Current Medications: Current Outpatient Medications  Medication Sig Dispense Refill  . acetaminophen (TYLENOL) 650 MG CR tablet Take 1 tablet by mouth every 8 (eight) hours.    Marland Kitchen aspirin EC 81 MG tablet Take 81  mg by mouth daily.     Marland Kitchen atenolol (TENORMIN) 25 MG tablet TAKE ONE TABLET BY MOUTH ONCE DAILY    . Cyanocobalamin (B-12) 1000 MCG TABS Take by mouth.    . felodipine (PLENDIL) 5 MG 24 hr tablet Take 5 mg by mouth daily.     . isosorbide dinitrate (ISORDIL) 20 MG tablet Take 20 mg by mouth 2 (two) times daily.     Marland Kitchen letrozole (FEMARA) 2.5 MG tablet TAKE 1 TABLET BY MOUTH ONCE DAILY 90 tablet 0  . lovastatin (MEVACOR) 10 MG tablet TAKE ONE TABLET BY MOUTH ONCE DAILY WITH DINNER    . omeprazole (PRILOSEC) 20 MG capsule Take 20 mg by mouth daily.     . potassium chloride SA (K-DUR,KLOR-CON) 20 MEQ tablet Take 20 mEq by mouth daily.      No current facility-administered medications for this visit.    Review of Systems  Constitutional: Negative for chills, diaphoresis, fever, malaise/fatigue and weight loss (up 4 pounds).       Feels "good".  HENT: Negative.  Negative for congestion, ear pain, hearing loss, nosebleeds, sinus pain and sore throat.   Eyes: Negative.  Negative for blurred vision, double vision, photophobia, pain and redness.  Respiratory: Negative.  Negative for cough, hemoptysis, sputum production and shortness of breath.   Cardiovascular: Positive for chest pain (comes and goes). Negative for palpitations, orthopnea, leg swelling and PND.  Gastrointestinal: Positive for heartburn (occasional reflux). Negative for abdominal pain, blood in stool, constipation, diarrhea, melena, nausea and vomiting.  Genitourinary: Negative.  Negative for dysuria, frequency, hematuria and urgency.       Discomfort near groin.  Musculoskeletal: Positive for joint pain (OA; stiffness in hands, knees, right hip). Negative for back pain, falls, myalgias and neck pain.       Tenderness in left and right axilla.  Skin: Negative.  Negative for itching and rash.  Neurological: Negative.  Negative for dizziness, tremors, sensory change, speech change, focal weakness, weakness and headaches.    Endo/Heme/Allergies: Negative.  Does not bruise/bleed easily.  Psychiatric/Behavioral: Negative.  Negative for depression and memory loss. The patient is not nervous/anxious and does not have insomnia.   All other systems reviewed and are negative.  Performance status (ECOG): 1  Vitals Blood pressure (!) 136/57, pulse 67, temperature (!) 96.7 F (35.9 C), temperature source Tympanic, resp. rate 18, height 5' 4"  (1.626 m), weight 167 lb 15.9 oz (76.2 kg), SpO2 100 %.   Physical Exam  Constitutional: She is oriented to person, place, and time. She appears well-developed and well-nourished. No distress.  HENT:  Head: Normocephalic and atraumatic.  Mouth/Throat: Oropharynx is clear and moist. No oropharyngeal exudate.  Cap.  Long gray braided hair.  Mask.  Eyes: Pupils are equal, round, and reactive to light. Conjunctivae and EOM are  normal. No scleral icterus.  Glasses.  Brown eyes.  Amblyopia.  Cardiovascular: Normal rate, regular rhythm and normal heart sounds. Exam reveals no gallop.  No murmur heard. Pulmonary/Chest: Effort normal and breath sounds normal. No respiratory distress. She has no wheezes. She has no rales. Right breast exhibits tenderness. Right breast exhibits no inverted nipple, no mass, no nipple discharge and no skin change. Left breast exhibits tenderness. Left breast exhibits no inverted nipple, no mass, no nipple discharge and no skin change.  Bilateral fibrocystic changes.  Left inferior fibrocystic changes.  Abdominal: Soft. Bowel sounds are normal. She exhibits no distension and no mass. There is no abdominal tenderness. There is no rebound and no guarding.  Musculoskeletal:        General: Tenderness (anterior hip; right axilla) present. No edema. Normal range of motion.     Cervical back: Normal range of motion and neck supple.  Lymphadenopathy:       Head (right side): No preauricular, no posterior auricular and no occipital adenopathy present.       Head (left  side): No preauricular, no posterior auricular and no occipital adenopathy present.    She has no cervical adenopathy.    She has no axillary adenopathy.       Right: No inguinal and no supraclavicular adenopathy present.       Left: No inguinal and no supraclavicular adenopathy present.  Neurological: She is alert and oriented to person, place, and time.  Skin: Skin is warm and dry. No rash noted. She is not diaphoretic. No erythema. No pallor.  Psychiatric: She has a normal mood and affect. Her behavior is normal. Judgment and thought content normal.  Nursing note and vitals reviewed.   Appointment on 12/21/2019  Component Date Value Ref Range Status  . Sodium 12/21/2019 137  135 - 145 mmol/L Final  . Potassium 12/21/2019 3.8  3.5 - 5.1 mmol/L Final  . Chloride 12/21/2019 107  98 - 111 mmol/L Final  . CO2 12/21/2019 24  22 - 32 mmol/L Final  . Glucose, Bld 12/21/2019 94  70 - 99 mg/dL Final  . BUN 12/21/2019 13  8 - 23 mg/dL Final  . Creatinine, Ser 12/21/2019 1.10* 0.44 - 1.00 mg/dL Final  . Calcium 12/21/2019 9.1  8.9 - 10.3 mg/dL Final  . Total Protein 12/21/2019 7.6  6.5 - 8.1 g/dL Final  . Albumin 12/21/2019 3.6  3.5 - 5.0 g/dL Final  . AST 12/21/2019 14* 15 - 41 U/L Final  . ALT 12/21/2019 11  0 - 44 U/L Final  . Alkaline Phosphatase 12/21/2019 98  38 - 126 U/L Final  . Total Bilirubin 12/21/2019 0.3  0.3 - 1.2 mg/dL Final  . GFR calc non Af Amer 12/21/2019 46* >60 mL/min Final  . GFR calc Af Amer 12/21/2019 53* >60 mL/min Final  . Anion gap 12/21/2019 6  5 - 15 Final   Performed at [facility], [address], [place], [place] [address]  . WBC 12/21/2019 7.5  4.0 - 10.5 K/uL Final  . RBC 12/21/2019 3.64* 3.87 - 5.11 MIL/uL Final  . Hemoglobin 12/21/2019 10.9* 12.0 - 15.0 g/dL Final  . HCT 12/21/2019 34.6* 36.0 - 46.0 % Final  . MCV 12/21/2019 95.1  80.0 - 100.0 fL Final  . MCH 12/21/2019 29.9  26.0 - 34.0 pg Final  . MCHC 12/21/2019 31.5  30.0 - 36.0 g/dL  Final  . RDW 12/21/2019 14.2  11.5 - 15.5 % Final  .  Platelets 12/21/2019 210  150 - 400 K/uL Final  . nRBC 12/21/2019 0.0  0.0 - 0.2 % Final  . Neutrophils Relative % 12/21/2019 66  % Final  . Neutro Abs 12/21/2019 5.0  1.7 - 7.7 K/uL Final  . Lymphocytes Relative 12/21/2019 23  % Final  . Lymphs Abs 12/21/2019 1.7  0.7 - 4.0 K/uL Final  . Monocytes Relative 12/21/2019 9  % Final  . Monocytes Absolute 12/21/2019 0.7  0.1 - 1.0 K/uL Final  . Eosinophils Relative 12/21/2019 1  % Final  . Eosinophils Absolute 12/21/2019 0.1  0.0 - 0.5 K/uL Final  . Basophils Relative 12/21/2019 1  % Final  . Basophils Absolute 12/21/2019 0.0  0.0 - 0.1 K/uL Final  . Immature Granulocytes 12/21/2019 0  % Final  . Abs Immature Granulocytes 12/21/2019 0.03  0.00 - 0.07 K/uL Final   Performed at [facility], [address] [place], [place] [address]    Assessment:  Erica Jackson is a 86 y.o. female with stage I left breast cancers/p lumpectomy and sentinel lymph node biopsy on 09/17/2016. Pathology revealed a 1.0 cm grade II invasive mammary carcinoma of no special type. Margins were negative. There was no tumor in 1 sentinel lymph node. Pathologic stagewas [id number].  Mammogram and ultrasoundon 08/16/2016 revealed a 1.1 x 0.6 x 0.8 cm mass at the 10 o'clock position.Left breast biopsy on 08/28/2016 revealed a 0.9 cm grade II invasive mammary carcinoma. Tumor was ER positive (>90%), PR negative, and Her2 2+ (negative by [place]).   She completed radiationon 11/28/2016. She began [facility] 12/11/2016.  CA27.29has been followed: 21.7 on 09/11/2016, 20.9 on 04/09/2017, 21.3 on 07/16/2017, 22 on 10/15/2017, 26.4 on 01/14/2018, 24.1 on 04/15/2018, 21.8 on 08/12/2018, 22.3 on 12/16/2018, and 20.3 on 06/16/2019.  Bilateral diagnostic mammogramon 08/30/2019 revealed no evidence for malignancy.   Right sided mammogram and ultrasoundon 02/19/2018 revealed a 0.6 x 0.7 x 0.7 cm mass at the  12:30 position 3 cm from the nipple. Ultrasound guided biopsyright breast biopsyand clip placement on 02/27/2018 revealed a hypocellular, circumscribed hyalanized nodule with no atypia or malignancy.  Bone densitystudy on 09/25/2016 was normalwith a T score of 0.4 in the left femoral neck. Bone densitywas normal on 09/28/2018 with a T score of 0.7 in the AP spine L1-L2, 0.1 in the left femoral neck, and 0.9 in the right forearm radius.  She has a monoclonal gammopathy of unknown significance (MGUS). SPEPon 12/11/2016 revealed a 0.4 gm/dL monoclonal protein. She has mild renal insufficiency (Cr 1.28; CrCl 45 ml/min). Work-up on 02/14/2018revealed an immunofixation with an IgG monoclonal protein with kappa light chain specificity. IgG was 2081. Free light chain ratio was 1.28 (normal). Beta 2 microglobulin was 2.4 (normal). 24 hour urine revealed no monoclonal protein or free light chains. Bone surveyon 01/08/2017 revealed no lytic lesions.  M-spikehas been followed (gm/dL): 0.4 on 12/11/2016, 0.6 on 07/16/2017, 0.4 on 01/14/2018, 0.6 on 08/12/2018, 0.5 on 12/16/2018, 0.5 on 06/16/2019, and 0.5 on 12/21/2019. IgGwas 2041 on 12/18/2016, 1926 on 07/16/2017, and 1919 on 12/16/2018.  Symptomatically, she feels good.  Exam is stable.  Plan: 1.   Labs today: CBC with diff, CMP, CA 27.29, SPEP. 2.Stage I left breast cancer Clinically, she is doing well.   Exam reveals no evidence of recurrent disease.    Bilateral diagnostic mammogram on 08/30/2019 revealed no evidence for malignancy.  CA 27.29 is 19.4 (normal). Continue Femara (began 12/11/2016). 3.Monoclonal gammopathy Clinically, she is doing well.   SPEP is  05. Gm/dL- stable. Continue to monitor every 6 months. 4.Weight loss Weight is up 4 pounds. Encourage caloric intake. Continue to monitor. 5.   RTC in 6 months for MD assessment and labs (CBC with diff, CMP, CA 27.29, SPEP).    I discussed the assessment  and treatment plan with the patient.  The patient was provided an opportunity to ask questions and all were answered.  The patient agreed with the plan and demonstrated an understanding of the instructions.  The patient was advised to call back if the symptoms worsen or if the condition fails to improve as anticipated.   Lequita Asal, MD, PhD    12/21/2019, 12:07 PM  I, Selena Batten, am acting as scribe for [employer]. Mike Gip, MD, PhD.  I, Deanie Jupiter C. Mike Gip, MD, have reviewed the above documentation for accuracy and completeness, and I agree with the above.

## 2019-12-20 ENCOUNTER — Encounter: Payer: Self-pay | Admitting: Hematology and Oncology

## 2019-12-20 ENCOUNTER — Other Ambulatory Visit: Payer: Self-pay

## 2019-12-20 NOTE — Progress Notes (Signed)
No new changes noted today. The patient name and DOB has been verified by phone today. 

## 2019-12-21 ENCOUNTER — Inpatient Hospital Stay (HOSPITAL_BASED_OUTPATIENT_CLINIC_OR_DEPARTMENT_OTHER): Payer: Medicare PPO | Admitting: Hematology and Oncology

## 2019-12-21 ENCOUNTER — Inpatient Hospital Stay: Payer: Medicare PPO | Attending: Hematology and Oncology

## 2019-12-21 ENCOUNTER — Encounter: Payer: Self-pay | Admitting: Hematology and Oncology

## 2019-12-21 VITALS — BP 136/57 | HR 67 | Temp 96.7°F | Resp 18 | Ht 64.0 in | Wt 168.0 lb

## 2019-12-21 DIAGNOSIS — Z7982 Long term (current) use of aspirin: Secondary | ICD-10-CM | POA: Insufficient documentation

## 2019-12-21 DIAGNOSIS — Z90721 Acquired absence of ovaries, unilateral: Secondary | ICD-10-CM | POA: Diagnosis not present

## 2019-12-21 DIAGNOSIS — D472 Monoclonal gammopathy: Secondary | ICD-10-CM

## 2019-12-21 DIAGNOSIS — Z923 Personal history of irradiation: Secondary | ICD-10-CM | POA: Diagnosis not present

## 2019-12-21 DIAGNOSIS — Z79811 Long term (current) use of aromatase inhibitors: Secondary | ICD-10-CM | POA: Insufficient documentation

## 2019-12-21 DIAGNOSIS — Z79899 Other long term (current) drug therapy: Secondary | ICD-10-CM | POA: Diagnosis not present

## 2019-12-21 DIAGNOSIS — Z853 Personal history of malignant neoplasm of breast: Secondary | ICD-10-CM

## 2019-12-21 DIAGNOSIS — I10 Essential (primary) hypertension: Secondary | ICD-10-CM | POA: Diagnosis not present

## 2019-12-21 DIAGNOSIS — Z90710 Acquired absence of both cervix and uterus: Secondary | ICD-10-CM | POA: Insufficient documentation

## 2019-12-21 DIAGNOSIS — C50312 Malignant neoplasm of lower-inner quadrant of left female breast: Secondary | ICD-10-CM | POA: Insufficient documentation

## 2019-12-21 DIAGNOSIS — E7800 Pure hypercholesterolemia, unspecified: Secondary | ICD-10-CM | POA: Insufficient documentation

## 2019-12-21 DIAGNOSIS — Z87891 Personal history of nicotine dependence: Secondary | ICD-10-CM | POA: Insufficient documentation

## 2019-12-21 DIAGNOSIS — Z809 Family history of malignant neoplasm, unspecified: Secondary | ICD-10-CM | POA: Insufficient documentation

## 2019-12-21 DIAGNOSIS — Z823 Family history of stroke: Secondary | ICD-10-CM | POA: Insufficient documentation

## 2019-12-21 DIAGNOSIS — R634 Abnormal weight loss: Secondary | ICD-10-CM | POA: Diagnosis not present

## 2019-12-21 DIAGNOSIS — Z170 Estrogen receptor positive status [ER+]: Secondary | ICD-10-CM | POA: Diagnosis not present

## 2019-12-21 LAB — CBC WITH DIFFERENTIAL/PLATELET
Abs Immature Granulocytes: 0.03 10*3/uL (ref 0.00–0.07)
Basophils Absolute: 0 10*3/uL (ref 0.0–0.1)
Basophils Relative: 1 %
Eosinophils Absolute: 0.1 10*3/uL (ref 0.0–0.5)
Eosinophils Relative: 1 %
HCT: 34.6 % — ABNORMAL LOW (ref 36.0–46.0)
Hemoglobin: 10.9 g/dL — ABNORMAL LOW (ref 12.0–15.0)
Immature Granulocytes: 0 %
Lymphocytes Relative: 23 %
Lymphs Abs: 1.7 10*3/uL (ref 0.7–4.0)
MCH: 29.9 pg (ref 26.0–34.0)
MCHC: 31.5 g/dL (ref 30.0–36.0)
MCV: 95.1 fL (ref 80.0–100.0)
Monocytes Absolute: 0.7 10*3/uL (ref 0.1–1.0)
Monocytes Relative: 9 %
Neutro Abs: 5 10*3/uL (ref 1.7–7.7)
Neutrophils Relative %: 66 %
Platelets: 210 10*3/uL (ref 150–400)
RBC: 3.64 MIL/uL — ABNORMAL LOW (ref 3.87–5.11)
RDW: 14.2 % (ref 11.5–15.5)
WBC: 7.5 10*3/uL (ref 4.0–10.5)
nRBC: 0 % (ref 0.0–0.2)

## 2019-12-21 LAB — COMPREHENSIVE METABOLIC PANEL
ALT: 11 U/L (ref 0–44)
AST: 14 U/L — ABNORMAL LOW (ref 15–41)
Albumin: 3.6 g/dL (ref 3.5–5.0)
Alkaline Phosphatase: 98 U/L (ref 38–126)
Anion gap: 6 (ref 5–15)
BUN: 13 mg/dL (ref 8–23)
CO2: 24 mmol/L (ref 22–32)
Calcium: 9.1 mg/dL (ref 8.9–10.3)
Chloride: 107 mmol/L (ref 98–111)
Creatinine, Ser: 1.1 mg/dL — ABNORMAL HIGH (ref 0.44–1.00)
GFR calc Af Amer: 53 mL/min — ABNORMAL LOW (ref >60)
GFR calc non Af Amer: 46 mL/min — ABNORMAL LOW (ref >60)
Glucose, Bld: 94 mg/dL (ref 70–99)
Potassium: 3.8 mmol/L (ref 3.5–5.1)
Sodium: 137 mmol/L (ref 135–145)
Total Bilirubin: 0.3 mg/dL (ref 0.3–1.2)
Total Protein: 7.6 g/dL (ref 6.5–8.1)

## 2019-12-22 LAB — PROTEIN ELECTROPHORESIS, SERUM
A/G Ratio: 1 (ref 0.7–1.7)
Albumin ELP: 3.5 g/dL (ref 2.9–4.4)
Alpha-1-Globulin: 0.2 g/dL (ref 0.0–0.4)
Alpha-2-Globulin: 0.6 g/dL (ref 0.4–1.0)
Beta Globulin: 1 g/dL (ref 0.7–1.3)
Gamma Globulin: 1.5 g/dL (ref 0.4–1.8)
Globulin, Total: 3.4 g/dL (ref 2.2–3.9)
M-Spike, %: 0.5 g/dL — ABNORMAL HIGH
Total Protein ELP: 6.9 g/dL (ref 6.0–8.5)

## 2019-12-22 LAB — CANCER ANTIGEN 27.29: CA 27.29: 19.4 U/mL (ref 0.0–38.6)

## 2020-01-23 ENCOUNTER — Other Ambulatory Visit: Payer: Self-pay | Admitting: Hematology and Oncology

## 2020-01-23 DIAGNOSIS — C50212 Malignant neoplasm of upper-inner quadrant of left female breast: Secondary | ICD-10-CM

## 2020-01-23 DIAGNOSIS — Z170 Estrogen receptor positive status [ER+]: Secondary | ICD-10-CM

## 2020-05-06 ENCOUNTER — Other Ambulatory Visit: Payer: Self-pay | Admitting: Hematology and Oncology

## 2020-05-06 DIAGNOSIS — Z170 Estrogen receptor positive status [ER+]: Secondary | ICD-10-CM

## 2020-05-13 ENCOUNTER — Other Ambulatory Visit: Payer: Self-pay | Admitting: Hematology and Oncology

## 2020-05-13 DIAGNOSIS — C50212 Malignant neoplasm of upper-inner quadrant of left female breast: Secondary | ICD-10-CM

## 2020-06-19 NOTE — Progress Notes (Signed)
[facility]  [address] [place], [place] [address] Phone: [phone]  Fax: [phone]   Clinic Day:  06/20/2020  Referring physician: Sallee Lange, *  Chief Complaint: Erica Jackson is a 86 y.o. female with stage I left breast cancer and a monoclonal gammopathy of unknown significance (MGUS) who is seen for 6 month assessment.   HPI: The patient was last seen in the medical oncology clinic on 12/21/2019. At that time, she feels good.  Exam is stable. Hematocrit was 34.6, hemoglobin 10.9, MCV 95.1, platelets 210,000, WBC 7,500. Creatinine was 1.10 (CrCl 53 ml/min). M spike was 0.5 g/dL. CA27.29 was 19.4. She continued Femara.  During the interim, she has been "fine." She has had a "swimmy head" for the past few weeks, which occurs after she stands up. She still has heartburn every once in a while.  Her chest pain, tenderness under her arms, and groin pain have resolved.  She has had no problems with Femara. She examines her breasts every once in a while and has no concerns.   Past Medical History:  Diagnosis Date  . Breast cancer ([place]) 2017   Lower inner quadrant left breast with lumpectomy and rad tx  . Breast cancer, left breast ([place]) 08/04/2016   Overview:  0.9 cm grade II invasive mammary carcinoma. Tumor was ER positive (> 90%), PR negative, and Her2 2+ (FISH negative)  . Cataract   . Dysrhythmia    no problems in a long time  . GERD (gastroesophageal reflux disease)   . Headache   . Hypercholesterolemia   . Hypertension   . Malignant neoplasm of upper-inner quadrant of left breast in female, estrogen receptor positive ([place])   . Personal history of radiation therapy 11/2016   for left breast ca    Past Surgical History:  Procedure Laterality Date  . ABDOMINAL HYSTERECTOMY    . BREAST BIOPSY Left 08/28/2016   invasive mammary ca  . BREAST BIOPSY Right 08/2017   HYALINIZED STROMA  . BREAST BIOPSY Right 03/2018    HYPOCELLULAR HYALINIZED NODULE  . BREAST EXCISIONAL BIOPSY Left 09/17/2016   lt breast ca  . BREAST LUMPECTOMY Left 09/2016   invasive mammary carcinoma with clear margins and rad tx  . BREAST LUMPECTOMY WITH SENTINEL LYMPH NODE BIOPSY Left 09/17/2016   Procedure: BREAST LUMPECTOMY WITH SENTINEL LYMPH NODE BX;  Surgeon: Clayburn Pert, MD;  Location: [facility] ORS;  Service: General;  Laterality: Left;  . EYE SURGERY Left 2015  . OOPHORECTOMY      Family History  Problem Relation Age of Onset  . Stroke Mother   . Cancer Sister   . Cancer Brother   . Cancer Daughter   . Cancer Brother   . Cancer Brother   . Cancer Brother   . Cancer Brother   . Cancer Brother   . Breast cancer Neg Hx     Social History:  reports that she quit smoking about 30 years ago. She smoked 1.00 pack per day. She quit smokeless tobacco use about 30 years ago. She reports that she does not drink alcohol and does not use drugs. The patient previously smoked 1 pack/week for 5-10 years. She stopped smoking in 1990. Her daughter, [name], is a Software engineer. She has been fishing. Her husband recently passed away. The patient is accompanied by her daughter, [name], via [place] today.  Allergies: No Known Allergies  Current Medications: Current Outpatient Medications  Medication Sig Dispense Refill  . acetaminophen (TYLENOL)  650 MG CR tablet Take 1 tablet by mouth every 8 (eight) hours.    Marland Kitchen aspirin EC 81 MG tablet Take 81 mg by mouth daily.     Marland Kitchen atenolol (TENORMIN) 25 MG tablet TAKE ONE TABLET BY MOUTH ONCE DAILY    . Cyanocobalamin (B-12) 1000 MCG TABS Take by mouth.    . felodipine (PLENDIL) 5 MG 24 hr tablet Take 5 mg by mouth daily.     . isosorbide dinitrate (ISORDIL) 20 MG tablet Take 20 mg by mouth 2 (two) times daily.     Marland Kitchen letrozole (FEMARA) 2.5 MG tablet Take 1 tablet by mouth once daily 90 tablet 0  . lovastatin (MEVACOR) 10 MG tablet TAKE ONE TABLET BY MOUTH ONCE DAILY WITH DINNER    . omeprazole  (PRILOSEC) 20 MG capsule Take 20 mg by mouth daily.     . potassium chloride SA (K-DUR,KLOR-CON) 20 MEQ tablet Take 20 mEq by mouth daily.      No current facility-administered medications for this visit.    Review of Systems  Constitutional: Positive for weight loss (1 lb). Negative for chills, diaphoresis, fever and malaise/fatigue.       Feels "good".  HENT: Negative.  Negative for congestion, ear discharge, ear pain, hearing loss, nosebleeds, sinus pain, sore throat and tinnitus.   Eyes: Negative.  Negative for blurred vision.  Respiratory: Negative.  Negative for cough, hemoptysis, sputum production and shortness of breath.   Cardiovascular: Negative for chest pain, palpitations, orthopnea, leg swelling and PND.  Gastrointestinal: Positive for heartburn (occasional). Negative for abdominal pain, blood in stool, constipation, diarrhea, melena, nausea and vomiting.  Genitourinary: Negative.  Negative for dysuria, frequency, hematuria and urgency.  Musculoskeletal: Positive for joint pain (OA; stiffness in hands, knees, right hip). Negative for back pain, falls, myalgias and neck pain.  Skin: Negative.  Negative for itching and rash.  Neurological: Negative for tingling, tremors, sensory change, speech change, focal weakness, weakness and headaches.       "Swimmy head" after standing up, x a few weeks  Endo/Heme/Allergies: Negative.  Does not bruise/bleed easily.  Psychiatric/Behavioral: Negative.  Negative for depression and memory loss. The patient is not nervous/anxious and does not have insomnia.   All other systems reviewed and are negative.  Performance status (ECOG): 1  Vitals Blood pressure (!) 133/52, pulse (!) 54, temperature (!) 97.5 F (36.4 C), temperature source Tympanic, resp. rate 18, weight 166 lb 1.9 oz (75.4 kg), SpO2 100 %.   Physical Exam Vitals and nursing note reviewed.  Constitutional:      General: She is not in acute distress.    Appearance: She is  well-developed. She is not diaphoretic.  HENT:     Head: Normocephalic and atraumatic.     Comments: Pearline Cables hair pulled back.    Mouth/Throat:     Pharynx: No oropharyngeal exudate.     Comments: Dentures. Eyes:     General: No scleral icterus.    Conjunctiva/sclera: Conjunctivae normal.     Pupils: Pupils are equal, round, and reactive to light.     Comments: Red rimmed glasses.  Brown eyes.  Amblyopia.  Cardiovascular:     Rate and Rhythm: Normal rate and regular rhythm.     Heart sounds: Normal heart sounds. No murmur heard.  No gallop.   Pulmonary:     Effort: Pulmonary effort is normal. No respiratory distress.     Breath sounds: Normal breath sounds. No wheezing or rales.  Chest:  Breasts:        Right: Skin change (fibrocystic changes superiorly) present. No inverted nipple, mass, nipple discharge or tenderness.        Left: Skin change (postsurgical changes into left axillae) present. No inverted nipple, mass, nipple discharge or tenderness.  Abdominal:     General: Bowel sounds are normal. There is no distension.     Palpations: Abdomen is soft. There is no hepatomegaly, splenomegaly or mass.     Tenderness: There is no abdominal tenderness. There is no guarding or rebound.  Musculoskeletal:        General: No tenderness. Normal range of motion.     Cervical back: Normal range of motion and neck supple.  Lymphadenopathy:     Head:     Right side of head: No preauricular, posterior auricular or occipital adenopathy.     Left side of head: No preauricular, posterior auricular or occipital adenopathy.     Cervical: No cervical adenopathy.     Upper Body:     Right upper body: No supraclavicular or axillary adenopathy.     Left upper body: No supraclavicular or axillary adenopathy.     Lower Body: No right inguinal adenopathy. No left inguinal adenopathy.  Skin:    General: Skin is warm and dry.     Coloration: Skin is not pale.     Findings: No erythema or rash.   Neurological:     Mental Status: She is alert and oriented to person, place, and time.  Psychiatric:        Behavior: Behavior normal.        Thought Content: Thought content normal.        Judgment: Judgment normal.     Appointment on 06/20/2020  Component Date Value Ref Range Status  . WBC 06/20/2020 6.8  4.0 - 10.5 K/uL Final  . RBC 06/20/2020 3.82* 3.87 - 5.11 MIL/uL Final  . Hemoglobin 06/20/2020 11.4* 12.0 - 15.0 g/dL Final  . HCT 06/20/2020 35.7* 36 - 46 % Final  . MCV 06/20/2020 93.5  80.0 - 100.0 fL Final  . MCH 06/20/2020 29.8  26.0 - 34.0 pg Final  . MCHC 06/20/2020 31.9  30.0 - 36.0 g/dL Final  . RDW 06/20/2020 14.2  11.5 - 15.5 % Final  . Platelets 06/20/2020 223  150 - 400 K/uL Final  . nRBC 06/20/2020 0.0  0.0 - 0.2 % Final  . Neutrophils Relative % 06/20/2020 73  % Final  . Neutro Abs 06/20/2020 5.0  1.7 - 7.7 K/uL Final  . Lymphocytes Relative 06/20/2020 20  % Final  . Lymphs Abs 06/20/2020 1.4  0.7 - 4.0 K/uL Final  . Monocytes Relative 06/20/2020 6  % Final  . Monocytes Absolute 06/20/2020 0.4  0 - 1 K/uL Final  . Eosinophils Relative 06/20/2020 1  % Final  . Eosinophils Absolute 06/20/2020 0.1  0 - 0 K/uL Final  . Basophils Relative 06/20/2020 0  % Final  . Basophils Absolute 06/20/2020 0.0  0 - 0 K/uL Final  . Immature Granulocytes 06/20/2020 0  % Final  . Abs Immature Granulocytes 06/20/2020 0.02  0.00 - 0.07 K/uL Final   Performed at [facility] [address], [place], [place] [address]  . Sodium 06/20/2020 139  135 - 145 mmol/L Final  . Potassium 06/20/2020 4.1  3.5 - 5.1 mmol/L Final  . Chloride 06/20/2020 108  98 - 111 mmol/L Final  . CO2 06/20/2020 24  22 - 32 mmol/L  Final  . Glucose, Bld 06/20/2020 118* 70 - 99 mg/dL Final   Glucose reference range applies only to samples taken after fasting for at least 8 hours.  . BUN 06/20/2020 18  8 - 23 mg/dL Final  . Creatinine, Ser 06/20/2020 1.26* 0.44 - 1.00 mg/dL Final  . Calcium  06/20/2020 8.8* 8.9 - 10.3 mg/dL Final  . Total Protein 06/20/2020 7.8  6.5 - 8.1 g/dL Final  . Albumin 06/20/2020 3.6  3.5 - 5.0 g/dL Final  . AST 06/20/2020 15  15 - 41 U/L Final  . ALT 06/20/2020 12  0 - 44 U/L Final  . Alkaline Phosphatase 06/20/2020 91  38 - 126 U/L Final  . Total Bilirubin 06/20/2020 0.2* 0.3 - 1.2 mg/dL Final  . GFR calc non Af Amer 06/20/2020 39* >60 mL/min Final  . GFR calc Af Amer 06/20/2020 45* >60 mL/min Final  . Anion gap 06/20/2020 7  5 - 15 Final   Performed at [facility], [address], [place], [place] [address]    Assessment:  CAMARA RENSTROM is a 86 y.o. female with stage I left breast cancers/p lumpectomy and sentinel lymph node biopsy on 09/17/2016. Pathology revealed a 1.0 cm grade II invasive mammary carcinoma of no special type. Margins were negative. There was no tumor in 1 sentinel lymph node. Pathologic stagewas [id number].  Mammogram and ultrasoundon 08/16/2016 revealed a 1.1 x 0.6 x 0.8 cm mass at the 10 o'clock position.Left breast biopsy on 08/28/2016 revealed a 0.9 cm grade II invasive mammary carcinoma. Tumor was ER positive (>90%), PR negative, and Her2 2+ (negative by [place]).   She completed radiationon 11/28/2016. She began [facility] 12/11/2016.  CA27.29has been followed: 21.7 on 09/11/2016, 20.9 on 04/09/2017, 21.3 on 07/16/2017, 22 on 10/15/2017, 26.4 on 01/14/2018, 24.1 on 04/15/2018, 21.8 on 08/12/2018, 22.3 on 12/16/2018, 20.3 on 06/16/2019, 19.4 on 12/21/2019, and 21.2 on 06/20/2020.  Bilateral diagnostic mammogramon 08/30/2019 revealed no evidence for malignancy.   Right sided mammogram and ultrasoundon 02/19/2018 revealed a 0.6 x 0.7 x 0.7 cm mass at the 12:30 position 3 cm from the nipple. Ultrasound guided biopsyright breast biopsyand clip placement on 02/27/2018 revealed a hypocellular, circumscribed hyalanized nodule with no atypia or malignancy.  Bone densitystudy on 09/25/2016 was  normalwith a T score of 0.4 in the left femoral neck. Bone densitywas normal on 09/28/2018 with a T score of 0.7 in the AP spine L1-L2, 0.1 in the left femoral neck, and 0.9 in the right forearm radius.  She has a monoclonal gammopathy of unknown significance (MGUS). SPEPon 12/11/2016 revealed a 0.4 gm/dL monoclonal protein. She has mild renal insufficiency (Cr 1.28; CrCl 45 ml/min). Work-up on 02/14/2018revealed an immunofixation with an IgG monoclonal protein with kappa light chain specificity. IgG was 2081. Free light chain ratio was 1.28 (normal). Beta 2 microglobulin was 2.4 (normal). 24 hour urine revealed no monoclonal protein or free light chains. Bone surveyon 01/08/2017 revealed no lytic lesions.  M-spikehas been followed (gm/dL): 0.4 on 12/11/2016, 0.6 on 07/16/2017, 0.4 on 01/14/2018, 0.6 on 08/12/2018, 0.5 on 12/16/2018, 0.5 on 06/16/2019, 0.5 on 12/21/2019, and 0.7 on 06/20/2020. IgGwas 2041 on 12/18/2016, 1926 on 07/16/2017, and 1919 on 12/16/2018.  The patient received the COVID-19 vaccine on 02/19/2020 and 03/11/2020.  Symptomatically,  she has been "fine." She has had a "swimmy head" for the past few weeks when she stands up.  Chest pain, tenderness under her arms, and groin pain have resolved.  She denies any breast concerns.  She denies any issues with infections or bone pain.  Plan: 1.   Labs today: CBC with diff, CMP, CA 27.29, SPEP. 2.Stage I left breast cancer Clinically, she is doing well.  Sam reveals no evidence of recurrent disease    Bilateral diagnostic mammogram on 08/30/2019 revealed no evidence for malignancy.  CA 27.29 is 21.2 (normal). Continue Femara (began 12/11/2016). 3.Monoclonal gammopathy Clinically, continues to do well  SPEP is 0.7 g/dL on 06/20/2020. Monitor every 6 months. 4.Weight loss Weight is down 1 pound. Continue to encourage caloric intake. 5.   Bilateral mammogram 08/29/2020. 6.   RN to call patient with SPEP  result. 7.   RTC in 6 months for MD assessment, labs (CBC with diff, CMP, SPEP, CA27.29), and review of mammogram.  I discussed the assessment and treatment plan with the patient.  The patient was provided an opportunity to ask questions and all were answered.  The patient agreed with the plan and demonstrated an understanding of the instructions.  The patient was advised to call back if the symptoms worsen or if the condition fails to improve as anticipated.   Lequita Asal, MD, PhD    06/20/2020, 11:02 AM  I, Mirian Mo Tufford, am acting as Education administrator for [employer]. Mike Gip, MD, PhD.  I, Robi Dewolfe C. Mike Gip, MD, have reviewed the above documentation for accuracy and completeness, and I agree with the above.

## 2020-06-20 ENCOUNTER — Encounter: Payer: Self-pay | Admitting: Hematology and Oncology

## 2020-06-20 ENCOUNTER — Inpatient Hospital Stay: Payer: Medicare PPO | Attending: Hematology and Oncology | Admitting: Hematology and Oncology

## 2020-06-20 ENCOUNTER — Inpatient Hospital Stay: Payer: Medicare PPO

## 2020-06-20 ENCOUNTER — Other Ambulatory Visit: Payer: Self-pay

## 2020-06-20 VITALS — BP 133/52 | HR 54 | Temp 97.5°F | Resp 18 | Wt 166.1 lb

## 2020-06-20 DIAGNOSIS — Z853 Personal history of malignant neoplasm of breast: Secondary | ICD-10-CM

## 2020-06-20 DIAGNOSIS — Z79899 Other long term (current) drug therapy: Secondary | ICD-10-CM | POA: Insufficient documentation

## 2020-06-20 DIAGNOSIS — R634 Abnormal weight loss: Secondary | ICD-10-CM

## 2020-06-20 DIAGNOSIS — D472 Monoclonal gammopathy: Secondary | ICD-10-CM | POA: Insufficient documentation

## 2020-06-20 DIAGNOSIS — Z90721 Acquired absence of ovaries, unilateral: Secondary | ICD-10-CM | POA: Diagnosis not present

## 2020-06-20 DIAGNOSIS — C50312 Malignant neoplasm of lower-inner quadrant of left female breast: Secondary | ICD-10-CM | POA: Diagnosis not present

## 2020-06-20 DIAGNOSIS — Z90710 Acquired absence of both cervix and uterus: Secondary | ICD-10-CM | POA: Insufficient documentation

## 2020-06-20 DIAGNOSIS — K219 Gastro-esophageal reflux disease without esophagitis: Secondary | ICD-10-CM | POA: Insufficient documentation

## 2020-06-20 DIAGNOSIS — Z809 Family history of malignant neoplasm, unspecified: Secondary | ICD-10-CM | POA: Insufficient documentation

## 2020-06-20 DIAGNOSIS — Z79811 Long term (current) use of aromatase inhibitors: Secondary | ICD-10-CM | POA: Insufficient documentation

## 2020-06-20 DIAGNOSIS — Z7982 Long term (current) use of aspirin: Secondary | ICD-10-CM | POA: Insufficient documentation

## 2020-06-20 DIAGNOSIS — Z923 Personal history of irradiation: Secondary | ICD-10-CM | POA: Insufficient documentation

## 2020-06-20 DIAGNOSIS — I10 Essential (primary) hypertension: Secondary | ICD-10-CM | POA: Diagnosis not present

## 2020-06-20 DIAGNOSIS — Z170 Estrogen receptor positive status [ER+]: Secondary | ICD-10-CM | POA: Insufficient documentation

## 2020-06-20 DIAGNOSIS — Z87891 Personal history of nicotine dependence: Secondary | ICD-10-CM | POA: Diagnosis not present

## 2020-06-20 DIAGNOSIS — E7800 Pure hypercholesterolemia, unspecified: Secondary | ICD-10-CM | POA: Diagnosis not present

## 2020-06-20 LAB — COMPREHENSIVE METABOLIC PANEL WITH GFR
ALT: 12 U/L (ref 0–44)
AST: 15 U/L (ref 15–41)
Albumin: 3.6 g/dL (ref 3.5–5.0)
Alkaline Phosphatase: 91 U/L (ref 38–126)
Anion gap: 7 (ref 5–15)
BUN: 18 mg/dL (ref 8–23)
CO2: 24 mmol/L (ref 22–32)
Calcium: 8.8 mg/dL — ABNORMAL LOW (ref 8.9–10.3)
Chloride: 108 mmol/L (ref 98–111)
Creatinine, Ser: 1.26 mg/dL — ABNORMAL HIGH (ref 0.44–1.00)
GFR calc Af Amer: 45 mL/min — ABNORMAL LOW
GFR calc non Af Amer: 39 mL/min — ABNORMAL LOW
Glucose, Bld: 118 mg/dL — ABNORMAL HIGH (ref 70–99)
Potassium: 4.1 mmol/L (ref 3.5–5.1)
Sodium: 139 mmol/L (ref 135–145)
Total Bilirubin: 0.2 mg/dL — ABNORMAL LOW (ref 0.3–1.2)
Total Protein: 7.8 g/dL (ref 6.5–8.1)

## 2020-06-20 LAB — CBC WITH DIFFERENTIAL/PLATELET
Abs Immature Granulocytes: 0.02 10*3/uL (ref 0.00–0.07)
Basophils Absolute: 0 10*3/uL (ref 0.0–0.1)
Basophils Relative: 0 %
Eosinophils Absolute: 0.1 10*3/uL (ref 0.0–0.5)
Eosinophils Relative: 1 %
HCT: 35.7 % — ABNORMAL LOW (ref 36.0–46.0)
Hemoglobin: 11.4 g/dL — ABNORMAL LOW (ref 12.0–15.0)
Immature Granulocytes: 0 %
Lymphocytes Relative: 20 %
Lymphs Abs: 1.4 10*3/uL (ref 0.7–4.0)
MCH: 29.8 pg (ref 26.0–34.0)
MCHC: 31.9 g/dL (ref 30.0–36.0)
MCV: 93.5 fL (ref 80.0–100.0)
Monocytes Absolute: 0.4 10*3/uL (ref 0.1–1.0)
Monocytes Relative: 6 %
Neutro Abs: 5 10*3/uL (ref 1.7–7.7)
Neutrophils Relative %: 73 %
Platelets: 223 10*3/uL (ref 150–400)
RBC: 3.82 MIL/uL — ABNORMAL LOW (ref 3.87–5.11)
RDW: 14.2 % (ref 11.5–15.5)
WBC: 6.8 10*3/uL (ref 4.0–10.5)
nRBC: 0 % (ref 0.0–0.2)

## 2020-06-20 NOTE — Progress Notes (Signed)
No new changes noted today 

## 2020-06-21 LAB — PROTEIN ELECTROPHORESIS, SERUM
A/G Ratio: 0.8 (ref 0.7–1.7)
Albumin ELP: 3.3 g/dL (ref 2.9–4.4)
Alpha-1-Globulin: 0.2 g/dL (ref 0.0–0.4)
Alpha-2-Globulin: 0.8 g/dL (ref 0.4–1.0)
Beta Globulin: 1.2 g/dL (ref 0.7–1.3)
Gamma Globulin: 1.8 g/dL (ref 0.4–1.8)
Globulin, Total: 4 g/dL — ABNORMAL HIGH (ref 2.2–3.9)
M-Spike, %: 0.7 g/dL — ABNORMAL HIGH
Total Protein ELP: 7.3 g/dL (ref 6.0–8.5)

## 2020-06-21 LAB — CANCER ANTIGEN 27.29: CA 27.29: 21.2 U/mL (ref 0.0–38.6)

## 2020-07-17 ENCOUNTER — Other Ambulatory Visit: Payer: Self-pay

## 2020-07-17 ENCOUNTER — Encounter: Payer: Self-pay | Admitting: Radiation Oncology

## 2020-07-17 ENCOUNTER — Ambulatory Visit
Admission: RE | Admit: 2020-07-17 | Discharge: 2020-07-17 | Disposition: A | Discharge status: Home / Self Care | Payer: Medicare PPO | Source: Ambulatory Visit | Attending: Radiation Oncology | Admitting: Radiation Oncology

## 2020-07-17 VITALS — BP 111/59 | HR 54 | Temp 96.4°F | Resp 16 | Wt 163.9 lb

## 2020-07-17 DIAGNOSIS — Z923 Personal history of irradiation: Secondary | ICD-10-CM | POA: Diagnosis not present

## 2020-07-17 DIAGNOSIS — Z79818 Long term (current) use of other agents affecting estrogen receptors and estrogen levels: Secondary | ICD-10-CM | POA: Insufficient documentation

## 2020-07-17 DIAGNOSIS — Z170 Estrogen receptor positive status [ER+]: Secondary | ICD-10-CM

## 2020-07-17 DIAGNOSIS — C50312 Malignant neoplasm of lower-inner quadrant of left female breast: Secondary | ICD-10-CM | POA: Insufficient documentation

## 2020-07-17 NOTE — Progress Notes (Signed)
Radiation Oncology Follow up Note  Name: Erica Jackson   Date:   07/17/2020 MRN:  [id number] DOB: 1934/05/24    This 86 y.o. female presents to the clinic today for 3-1/2-year follow-up status post whole breast radiation to her left breast for stage I ER positive invasive mammary carcinoma.  REFERRING PROVIDER: Sallee Lange, *  HPI: Patient is a 86 year old female now out 3-1/2 years having completed whole breast radiation to her left breast for stage I ER positive PR negative invasive mammary carcinoma.  Seen today in routine follow-up she is doing well she specifically denies breast tenderness cough or bone pain..  She is currently on Femara tolerating it well without side effect.  She had mammograms last October which I have reviewed were BI-RADS 2 benign she has another mammogram in November.  COMPLICATIONS OF TREATMENT: None FOLLOW UP COMPLIANCE: Excellent  PHYSICAL EXAM:  BP (!) 111/59 (BP Location: Left Arm, Patient Position: Sitting)   Pulse (!) 54   Temp (!) 96.4 F (35.8 C) (Tympanic)   Resp 16   Wt 163 lb 14.4 oz (74.3 kg)   BMI 28.13 kg/m  Lungs are clear to A&P cardiac examination essentially unremarkable with regular rate and rhythm. No dominant mass or nodularity is noted in either breast in 2 positions examined. Incision is well-healed. No axillary or supraclavicular adenopathy is appreciated. Cosmetic result is excellent.  Well-developed well-nourished patient in NAD. HEENT reveals PERLA, EOMI, discs not visualized.  Oral cavity is clear. No oral mucosal lesions are identified. Neck is clear without evidence of cervical or supraclavicular adenopathy. Lungs are clear to A&P. Cardiac examination is essentially unremarkable with regular rate and rhythm without murmur rub or thrill. Abdomen is benign with no organomegaly or masses noted. Motor sensory and DTR levels are equal and symmetric in the upper and lower extremities. Cranial nerves II through XII are grossly  intact. Proprioception is intact. No peripheral adenopathy or edema is identified. No motor or sensory levels are noted. Crude visual fields are within normal range.  RADIOLOGY RESULTS: Mammograms reviewed compatible with above-stated findings  PLAN: Present time patient is doing well with no evidence of disease 3-1/2 years out.  Of asked to see her back in 1 year for follow-up and then will discontinue follow-up care.  Patient knows to call with any concerns.  I would like to take this opportunity to thank you for allowing me to participate in the care of your patient.Noreene Filbert, MD

## 2020-09-04 ENCOUNTER — Ambulatory Visit
Admission: RE | Admit: 2020-09-04 | Discharge: 2020-09-04 | Disposition: A | Discharge status: Home / Self Care | Payer: Medicare PPO | Source: Ambulatory Visit | Attending: Hematology and Oncology | Admitting: Hematology and Oncology

## 2020-09-04 ENCOUNTER — Other Ambulatory Visit: Payer: Self-pay

## 2020-09-04 DIAGNOSIS — Z853 Personal history of malignant neoplasm of breast: Secondary | ICD-10-CM | POA: Diagnosis not present

## 2020-11-04 ENCOUNTER — Other Ambulatory Visit: Payer: Self-pay | Admitting: Hematology and Oncology

## 2020-11-04 DIAGNOSIS — C50212 Malignant neoplasm of upper-inner quadrant of left female breast: Secondary | ICD-10-CM

## 2020-12-15 ENCOUNTER — Other Ambulatory Visit: Payer: Self-pay

## 2020-12-15 DIAGNOSIS — Z170 Estrogen receptor positive status [ER+]: Secondary | ICD-10-CM

## 2020-12-15 DIAGNOSIS — C50312 Malignant neoplasm of lower-inner quadrant of left female breast: Secondary | ICD-10-CM

## 2020-12-19 NOTE — Progress Notes (Signed)
[facility]  [address] [place], [place] [address] Phone: [phone]  Fax: [phone]   Clinic Day:  12/20/2020  Referring physician: Sallee Lange, *  Chief Complaint: Erica Jackson is a 87 y.o. female with stage I left breast cancer and a monoclonal gammopathy of unknown significance (MGUS) who is seen for 6 month assessment.   HPI: The patient was last seen in the medical oncology clinic on 06/20/2020. At that time, she had been "fine." She had had a "swimmy head" for the past few weeks when she stood up.  Chest pain, tenderness under her arms, and groin pain had resolved.  She denied any breast concerns.  She denied any issues with infections or bone pain. Hematocrit was 35.7, hemoglobin 11.4, MCV 93.5, platelets 223,000, WBC 6,800. Creatinine was 1.26 (CrCl 45 ml/min). Calcium was 8.8. CA27.29 was 21.2. M spike was 0.7 gm/dL. She continued Femara.  The patient saw Dr. Baruch Gouty on 07/17/2020. She was tolerating Femara well and denied breast tenderness, cough, and bone pain. Follow-up was planned for 1 year.  Bilateral diagnostic mammogram on 09/04/2020 revealed no evidence of malignancy.  During the interim, she has been "fine." She has been having some trouble sleeping. She is eating well and has occasional heartburn. Her average weight is about 160 lbs. She still has a little bit of stiffness in her right hand, but the rest of her arthritis symptoms have resolved. Her "swimmy-headedness" has resolved. She denies infections and bone pain.  She performs monthly breast exams and has no concerns.   Past Medical History:  Diagnosis Date  . Breast cancer ([place]) 2017   Lower inner quadrant left breast with lumpectomy and rad tx  . Breast cancer, left breast ([place]) 08/04/2016   Overview:  0.9 cm grade II invasive mammary carcinoma. Tumor was ER positive (> 90%), PR negative, and Her2 2+ (FISH negative)  . Cataract   . Dysrhythmia    no  problems in a long time  . GERD (gastroesophageal reflux disease)   . Headache   . Hypercholesterolemia   . Hypertension   . Malignant neoplasm of upper-inner quadrant of left breast in female, estrogen receptor positive ([place])   . Personal history of radiation therapy 11/2016   for left breast ca    Past Surgical History:  Procedure Laterality Date  . ABDOMINAL HYSTERECTOMY    . BREAST BIOPSY Left 08/28/2016   invasive mammary ca  . BREAST BIOPSY Right 08/2017   HYALINIZED STROMA  . BREAST BIOPSY Right 03/2018   HYPOCELLULAR HYALINIZED NODULE  . BREAST EXCISIONAL BIOPSY Left 09/17/2016   lt breast ca  . BREAST LUMPECTOMY Left 09/2016   invasive mammary carcinoma with clear margins and rad tx  . BREAST LUMPECTOMY WITH SENTINEL LYMPH NODE BIOPSY Left 09/17/2016   Procedure: BREAST LUMPECTOMY WITH SENTINEL LYMPH NODE BX;  Surgeon: Clayburn Pert, MD;  Location: [facility] ORS;  Service: General;  Laterality: Left;  . EYE SURGERY Left 2015  . OOPHORECTOMY      Family History  Problem Relation Age of Onset  . Stroke Mother   . Cancer Sister   . Cancer Brother   . Cancer Daughter   . Cancer Brother   . Cancer Brother   . Cancer Brother   . Cancer Brother   . Cancer Brother   . Breast cancer Neg Hx     Social History:  reports that she quit smoking about 31 years ago. She smoked 1.00 pack  per day. She quit smokeless tobacco use about 31 years ago. She reports that she does not drink alcohol and does not use drugs. The patient previously smoked 1 pack/week for 5-10 years. She stopped smoking in 1990. Her daughter, [name], is a Software engineer. She has been fishing. Her husband recently passed away. The patient is alone today.  Allergies: No Known Allergies  Current Medications: Current Outpatient Medications  Medication Sig Dispense Refill  . acetaminophen (TYLENOL) 650 MG CR tablet Take 1 tablet by mouth as needed.    Marland Kitchen aspirin EC 81 MG tablet Take 81 mg by mouth daily.     Marland Kitchen  atenolol (TENORMIN) 25 MG tablet TAKE ONE TABLET BY MOUTH ONCE DAILY    . Cyanocobalamin (B-12) 1000 MCG TABS Take by mouth daily.    . felodipine (PLENDIL) 5 MG 24 hr tablet Take 5 mg by mouth daily.     . isosorbide dinitrate (ISORDIL) 20 MG tablet Take 20 mg by mouth daily.    Marland Kitchen letrozole (FEMARA) 2.5 MG tablet Take 1 tablet by mouth once daily 90 tablet 0  . lovastatin (MEVACOR) 10 MG tablet TAKE ONE TABLET BY MOUTH ONCE DAILY WITH DINNER    . omeprazole (PRILOSEC) 20 MG capsule Take 20 mg by mouth daily.     . potassium chloride SA (K-DUR,KLOR-CON) 20 MEQ tablet Take 20 mEq by mouth daily.      No current facility-administered medications for this visit.    Review of Systems  Constitutional: Positive for weight loss (3 lbs). Negative for chills, diaphoresis, fever and malaise/fatigue.       Feels "fine".  HENT: Negative.  Negative for congestion, ear discharge, ear pain, hearing loss, nosebleeds, sinus pain, sore throat and tinnitus.   Eyes: Negative.  Negative for blurred vision.  Respiratory: Negative.  Negative for cough, hemoptysis, sputum production and shortness of breath.   Cardiovascular: Negative.  Negative for chest pain, palpitations, orthopnea, leg swelling and PND.  Gastrointestinal: Positive for heartburn (occasional). Negative for abdominal pain, blood in stool, constipation, diarrhea, melena, nausea and vomiting.  Genitourinary: Negative.  Negative for dysuria, frequency, hematuria and urgency.  Musculoskeletal: Positive for joint pain (OA; stiffness in right hand). Negative for back pain, falls, myalgias and neck pain.  Skin: Negative.  Negative for itching and rash.  Neurological: Negative.  Negative for dizziness, tingling, tremors, sensory change, speech change, focal weakness, weakness and headaches.  Endo/Heme/Allergies: Negative.  Does not bruise/bleed easily.  Psychiatric/Behavioral: Negative for depression and memory loss. The patient has insomnia. The patient  is not nervous/anxious.   All other systems reviewed and are negative.  Performance status (ECOG): 1  Vitals Blood pressure (!) 166/66, pulse (!) 58, temperature (!) 97.5 F (36.4 C), temperature source Tympanic, resp. rate 16, weight 163 lb 7.5 oz (74.2 kg), SpO2 100 %.   Physical Exam Vitals and nursing note reviewed.  Constitutional:      General: She is not in acute distress.    Appearance: She is well-developed. She is not diaphoretic.     Comments: Requires assistance onto exam table.  HENT:     Head: Normocephalic and atraumatic.     Comments: Pearline Cables hair pulled back.    Mouth/Throat:     Mouth: Mucous membranes are moist.     Pharynx: Oropharynx is clear. No oropharyngeal exudate.     Comments: Dentures. Piece of candy in mouth. Eyes:     General: No scleral icterus.    Conjunctiva/sclera: Conjunctivae normal.  Pupils: Pupils are equal, round, and reactive to light.     Comments: Red rimmed glasses.  Brown eyes.  Amblyopia.  Cardiovascular:     Rate and Rhythm: Normal rate and regular rhythm.     Heart sounds: Normal heart sounds. No murmur heard. No gallop.   Pulmonary:     Effort: Pulmonary effort is normal. No respiratory distress.     Breath sounds: Normal breath sounds. No wheezing or rales.  Chest:  Breasts:     Right: Skin change (fibrocystic changes at 10 o clock) present. No swelling, bleeding, mass, tenderness, axillary adenopathy or supraclavicular adenopathy.     Left: Skin change (fibrosytic changes at 11 o clock) present. No swelling, bleeding, mass, tenderness, axillary adenopathy or supraclavicular adenopathy.    Abdominal:     General: Bowel sounds are normal. There is no distension.     Palpations: Abdomen is soft. There is no hepatomegaly, splenomegaly or mass.     Tenderness: There is no abdominal tenderness. There is no guarding or rebound.  Musculoskeletal:        General: No tenderness. Normal range of motion.     Cervical back: Normal  range of motion and neck supple.  Lymphadenopathy:     Head:     Right side of head: No preauricular, posterior auricular or occipital adenopathy.     Left side of head: No preauricular, posterior auricular or occipital adenopathy.     Cervical: No cervical adenopathy.     Upper Body:     Right upper body: No supraclavicular or axillary adenopathy.     Left upper body: No supraclavicular or axillary adenopathy.     Lower Body: No right inguinal adenopathy. No left inguinal adenopathy.  Skin:    General: Skin is warm and dry.     Coloration: Skin is not pale.     Findings: No erythema or rash.  Neurological:     Mental Status: She is alert and oriented to person, place, and time.  Psychiatric:        Behavior: Behavior normal.        Thought Content: Thought content normal.        Judgment: Judgment normal.    Appointment on 12/20/2020  Component Date Value Ref Range Status  . Sodium 12/20/2020 138  135 - 145 mmol/L Final  . Potassium 12/20/2020 3.5  3.5 - 5.1 mmol/L Final  . Chloride 12/20/2020 104  98 - 111 mmol/L Final  . CO2 12/20/2020 25  22 - 32 mmol/L Final  . Glucose, Bld 12/20/2020 103* 70 - 99 mg/dL Final   Glucose reference range applies only to samples taken after fasting for at least 8 hours.  . BUN 12/20/2020 14  8 - 23 mg/dL Final  . Creatinine, Ser 12/20/2020 1.22* 0.44 - 1.00 mg/dL Final  . Calcium 12/20/2020 9.2  8.9 - 10.3 mg/dL Final  . Total Protein 12/20/2020 8.3* 6.5 - 8.1 g/dL Final  . Albumin 12/20/2020 3.8  3.5 - 5.0 g/dL Final  . AST 12/20/2020 16  15 - 41 U/L Final  . ALT 12/20/2020 12  0 - 44 U/L Final  . Alkaline Phosphatase 12/20/2020 102  38 - 126 U/L Final  . Total Bilirubin 12/20/2020 0.3  0.3 - 1.2 mg/dL Final  . GFR, Estimated 12/20/2020 43* >60 mL/min Final   Comment: (NOTE) Calculated using the CKD-EPI Creatinine Equation (2021)   . Anion gap 12/20/2020 9  5 - 15 Final   Performed at  [facility], [address],  [place], [place] [address]  . WBC 12/20/2020 7.0  4.0 - 10.5 K/uL Final  . RBC 12/20/2020 3.96  3.87 - 5.11 MIL/uL Final  . Hemoglobin 12/20/2020 11.6* 12.0 - 15.0 g/dL Final  . HCT 12/20/2020 37.2  36.0 - 46.0 % Final  . MCV 12/20/2020 93.9  80.0 - 100.0 fL Final  . MCH 12/20/2020 29.3  26.0 - 34.0 pg Final  . MCHC 12/20/2020 31.2  30.0 - 36.0 g/dL Final  . RDW 12/20/2020 14.3  11.5 - 15.5 % Final  . Platelets 12/20/2020 240  150 - 400 K/uL Final  . nRBC 12/20/2020 0.0  0.0 - 0.2 % Final  . Neutrophils Relative % 12/20/2020 69  % Final  . Neutro Abs 12/20/2020 4.8  1.7 - 7.7 K/uL Final  . Lymphocytes Relative 12/20/2020 24  % Final  . Lymphs Abs 12/20/2020 1.7  0.7 - 4.0 K/uL Final  . Monocytes Relative 12/20/2020 6  % Final  . Monocytes Absolute 12/20/2020 0.4  0.1 - 1.0 K/uL Final  . Eosinophils Relative 12/20/2020 1  % Final  . Eosinophils Absolute 12/20/2020 0.1  0.0 - 0.5 K/uL Final  . Basophils Relative 12/20/2020 0  % Final  . Basophils Absolute 12/20/2020 0.0  0.0 - 0.1 K/uL Final  . Immature Granulocytes 12/20/2020 0  % Final  . Abs Immature Granulocytes 12/20/2020 0.03  0.00 - 0.07 K/uL Final   Performed at [facility], [address] [place], [place] [address]    Assessment:  Erica Jackson is a 87 y.o. female with stage I left breast cancers/p lumpectomy and sentinel lymph node biopsy on 09/17/2016. Pathology revealed a 1.0 cm grade II invasive mammary carcinoma of no special type. Margins were negative. There was no tumor in 1 sentinel lymph node. Pathologic stagewas [id number].  Mammogram and ultrasoundon 08/16/2016 revealed a 1.1 x 0.6 x 0.8 cm mass at the 10 o'clock position.Left breast biopsy on 08/28/2016 revealed a 0.9 cm grade II invasive mammary carcinoma. Tumor was ER positive (>90%), PR negative, and Her2 2+ (negative by [place]).   She completed radiationon 11/28/2016. [facility] 12/11/2016.  CA27.29has been followed: 21.7 on  09/11/2016, 20.9 on 04/09/2017, 21.3 on 07/16/2017, 22 on 10/15/2017, 26.4 on 01/14/2018, 24.1 on 04/15/2018, 21.8 on 08/12/2018, 22.3 on 12/16/2018, 20.3 on 06/16/2019, 19.4 on 12/21/2019, and 21.2 on 06/20/2020.  Bilateral diagnostic mammogramon 09/04/2020 revealed no evidence of malignancy.  Right sided mammogram and ultrasoundon 02/19/2018 revealed a 0.6 x 0.7 x 0.7 cm mass at the 12:30 position 3 cm from the nipple. Ultrasound guided biopsyright breast biopsyand clip placement on 02/27/2018 revealed a hypocellular, circumscribed hyalanized nodule with no atypia or malignancy.  Bone densitystudy on 09/25/2016 was normalwith a T score of 0.4 in the left femoral neck. Bone densitywas normal on 09/28/2018 with a T score of 0.7 in the AP spine L1-L2, 0.1 in the left femoral neck, and 0.9 in the right forearm radius.  She has a monoclonal gammopathy of unknown significance (MGUS). SPEPon 12/11/2016 revealed a 0.4 gm/dL monoclonal protein. She has mild renal insufficiency (Cr 1.28; CrCl 45 ml/min). Work-up on 02/14/2018revealed an immunofixation with an IgG monoclonal protein with kappa light chain specificity. IgG was 2081. Free light chain ratio was 1.28 (normal). Beta 2 microglobulin was 2.4 (normal). 24 hour urine revealed no monoclonal protein or free light chains. Bone surveyon 01/08/2017 revealed no lytic lesions.  M-spikehas been followed (gm/dL): 0.4 on 12/11/2016,  0.6 on 07/16/2017, 0.4 on 01/14/2018, 0.6 on 08/12/2018, 0.5 on 12/16/2018, 0.5 on 06/16/2019, 0.5 on 12/21/2019, and 0.7 on 06/20/2020. IgGwas 2041 on 12/18/2016, 1926 on 07/16/2017, and 1919 on 12/16/2018.  The patient received the [place] COVID-19 vaccine on 02/19/2020 and 03/11/2020. She received the [employer] on 09/27/2020.  Symptomatically,  she has been "fine."  She denies infections and bone pain.  She performs monthly breast exams and has no concerns.  Exam is stable.  Plan: 1.   Labs  today: CBC with diff, CMP, SPEP, CA27.29 2.Stage I left breast cancer Clinically, she is doing well.  Exam reveals no evidence of recurrent disease.    Bilateral diagnostic mammogram on 09/04/2020 revealed no evidence of malignancy.  CA 27.29 is 23.4 (normal). Continue Femara (began 12/11/2016). 3.Monoclonal gammopathy Clinically, she is doing well  SPEP is 0.5 g/dL today. Continue to monitor every 6 months. 4.Weight loss Weight is down 3 pounds. Encourage caloric intake and supplements. 5.   RTC in 6 months for MD assess and  labs (CBC with diff, CMP, SPEP, CA27.29).  I discussed the assessment and treatment plan with the patient.  The patient was provided an opportunity to ask questions and all were answered.  The patient agreed with the plan and demonstrated an understanding of the instructions.  The patient was advised to call back if the symptoms worsen or if the condition fails to improve as anticipated.   Lequita Asal, MD, PhD    12/20/2020, 10:36 AM  I, Mirian Mo Tufford, am acting as Education administrator for [employer]. Mike Gip, MD, PhD.  I, Gitty Osterlund C. Mike Gip, MD, have reviewed the above documentation for accuracy and completeness, and I agree with the above.

## 2020-12-20 ENCOUNTER — Inpatient Hospital Stay: Payer: Medicare PPO

## 2020-12-20 ENCOUNTER — Encounter: Payer: Self-pay | Admitting: Hematology and Oncology

## 2020-12-20 ENCOUNTER — Other Ambulatory Visit: Payer: Self-pay

## 2020-12-20 ENCOUNTER — Inpatient Hospital Stay: Payer: Medicare PPO | Attending: Hematology and Oncology | Admitting: Hematology and Oncology

## 2020-12-20 VITALS — BP 166/66 | HR 58 | Temp 97.5°F | Resp 16 | Wt 163.5 lb

## 2020-12-20 DIAGNOSIS — C50312 Malignant neoplasm of lower-inner quadrant of left female breast: Secondary | ICD-10-CM | POA: Diagnosis present

## 2020-12-20 DIAGNOSIS — Z923 Personal history of irradiation: Secondary | ICD-10-CM | POA: Insufficient documentation

## 2020-12-20 DIAGNOSIS — Z90721 Acquired absence of ovaries, unilateral: Secondary | ICD-10-CM | POA: Diagnosis not present

## 2020-12-20 DIAGNOSIS — Z809 Family history of malignant neoplasm, unspecified: Secondary | ICD-10-CM | POA: Diagnosis not present

## 2020-12-20 DIAGNOSIS — Z87891 Personal history of nicotine dependence: Secondary | ICD-10-CM | POA: Diagnosis not present

## 2020-12-20 DIAGNOSIS — R634 Abnormal weight loss: Secondary | ICD-10-CM

## 2020-12-20 DIAGNOSIS — Z79811 Long term (current) use of aromatase inhibitors: Secondary | ICD-10-CM | POA: Insufficient documentation

## 2020-12-20 DIAGNOSIS — D472 Monoclonal gammopathy: Secondary | ICD-10-CM | POA: Insufficient documentation

## 2020-12-20 DIAGNOSIS — Z7982 Long term (current) use of aspirin: Secondary | ICD-10-CM | POA: Diagnosis not present

## 2020-12-20 DIAGNOSIS — Z170 Estrogen receptor positive status [ER+]: Secondary | ICD-10-CM

## 2020-12-20 DIAGNOSIS — Z90710 Acquired absence of both cervix and uterus: Secondary | ICD-10-CM | POA: Diagnosis not present

## 2020-12-20 DIAGNOSIS — Z79899 Other long term (current) drug therapy: Secondary | ICD-10-CM | POA: Insufficient documentation

## 2020-12-20 DIAGNOSIS — I10 Essential (primary) hypertension: Secondary | ICD-10-CM | POA: Insufficient documentation

## 2020-12-20 DIAGNOSIS — Z853 Personal history of malignant neoplasm of breast: Secondary | ICD-10-CM | POA: Diagnosis not present

## 2020-12-20 LAB — COMPREHENSIVE METABOLIC PANEL
ALT: 12 U/L (ref 0–44)
AST: 16 U/L (ref 15–41)
Albumin: 3.8 g/dL (ref 3.5–5.0)
Alkaline Phosphatase: 102 U/L (ref 38–126)
Anion gap: 9 (ref 5–15)
BUN: 14 mg/dL (ref 8–23)
CO2: 25 mmol/L (ref 22–32)
Calcium: 9.2 mg/dL (ref 8.9–10.3)
Chloride: 104 mmol/L (ref 98–111)
Creatinine, Ser: 1.22 mg/dL — ABNORMAL HIGH (ref 0.44–1.00)
GFR, Estimated: 43 mL/min — ABNORMAL LOW (ref >60)
Glucose, Bld: 103 mg/dL — ABNORMAL HIGH (ref 70–99)
Potassium: 3.5 mmol/L (ref 3.5–5.1)
Sodium: 138 mmol/L (ref 135–145)
Total Bilirubin: 0.3 mg/dL (ref 0.3–1.2)
Total Protein: 8.3 g/dL — ABNORMAL HIGH (ref 6.5–8.1)

## 2020-12-20 LAB — CBC WITH DIFFERENTIAL/PLATELET
Abs Immature Granulocytes: 0.03 10*3/uL (ref 0.00–0.07)
Basophils Absolute: 0 10*3/uL (ref 0.0–0.1)
Basophils Relative: 0 %
Eosinophils Absolute: 0.1 10*3/uL (ref 0.0–0.5)
Eosinophils Relative: 1 %
HCT: 37.2 % (ref 36.0–46.0)
Hemoglobin: 11.6 g/dL — ABNORMAL LOW (ref 12.0–15.0)
Immature Granulocytes: 0 %
Lymphocytes Relative: 24 %
Lymphs Abs: 1.7 10*3/uL (ref 0.7–4.0)
MCH: 29.3 pg (ref 26.0–34.0)
MCHC: 31.2 g/dL (ref 30.0–36.0)
MCV: 93.9 fL (ref 80.0–100.0)
Monocytes Absolute: 0.4 10*3/uL (ref 0.1–1.0)
Monocytes Relative: 6 %
Neutro Abs: 4.8 10*3/uL (ref 1.7–7.7)
Neutrophils Relative %: 69 %
Platelets: 240 10*3/uL (ref 150–400)
RBC: 3.96 MIL/uL (ref 3.87–5.11)
RDW: 14.3 % (ref 11.5–15.5)
WBC: 7 10*3/uL (ref 4.0–10.5)
nRBC: 0 % (ref 0.0–0.2)

## 2020-12-21 LAB — CANCER ANTIGEN 27.29: CA 27.29: 23.4 U/mL (ref 0.0–38.6)

## 2020-12-22 LAB — PROTEIN ELECTROPHORESIS, SERUM
A/G Ratio: 1.1 (ref 0.7–1.7)
Albumin ELP: 3.9 g/dL (ref 2.9–4.4)
Alpha-1-Globulin: 0.3 g/dL (ref 0.0–0.4)
Alpha-2-Globulin: 0.8 g/dL (ref 0.4–1.0)
Beta Globulin: 1 g/dL (ref 0.7–1.3)
Gamma Globulin: 1.4 g/dL (ref 0.4–1.8)
Globulin, Total: 3.5 g/dL (ref 2.2–3.9)
M-Spike, %: 0.5 g/dL — ABNORMAL HIGH
Total Protein ELP: 7.4 g/dL (ref 6.0–8.5)

## 2021-02-11 ENCOUNTER — Other Ambulatory Visit: Payer: Self-pay | Admitting: Hematology and Oncology

## 2021-02-11 DIAGNOSIS — C50212 Malignant neoplasm of upper-inner quadrant of left female breast: Secondary | ICD-10-CM

## 2021-05-21 ENCOUNTER — Telehealth: Payer: Self-pay | Admitting: *Deleted

## 2021-05-21 ENCOUNTER — Other Ambulatory Visit: Payer: Self-pay | Admitting: *Deleted

## 2021-05-21 DIAGNOSIS — C50212 Malignant neoplasm of upper-inner quadrant of left female breast: Secondary | ICD-10-CM

## 2021-05-21 DIAGNOSIS — D472 Monoclonal gammopathy: Secondary | ICD-10-CM

## 2021-05-21 DIAGNOSIS — Z853 Personal history of malignant neoplasm of breast: Secondary | ICD-10-CM

## 2021-05-21 DIAGNOSIS — Z170 Estrogen receptor positive status [ER+]: Secondary | ICD-10-CM

## 2021-05-21 MED ORDER — LETROZOLE 2.5 MG PO TABS: 2.5000 mg | ORAL_TABLET | Freq: Every day | ORAL | 0 refills | Status: DC

## 2021-05-21 NOTE — Telephone Encounter (Signed)
Incoming fax from pt's [facility] in [place]. Patient needs letrozole RF 2.5 mg once daily. #90.  Reviewed chart. Pt has an apt with Dr. Rogue Bussing on 06/19/21. (Former Dance movement psychotherapist patient). RF submitted.

## 2021-06-11 ENCOUNTER — Other Ambulatory Visit: Payer: Self-pay | Admitting: Nurse Practitioner

## 2021-06-11 DIAGNOSIS — Z1231 Encounter for screening mammogram for malignant neoplasm of breast: Secondary | ICD-10-CM

## 2021-06-19 ENCOUNTER — Inpatient Hospital Stay (HOSPITAL_BASED_OUTPATIENT_CLINIC_OR_DEPARTMENT_OTHER): Payer: Medicare PPO | Admitting: Internal Medicine

## 2021-06-19 ENCOUNTER — Inpatient Hospital Stay: Payer: Medicare PPO | Attending: Internal Medicine

## 2021-06-19 ENCOUNTER — Other Ambulatory Visit: Payer: Self-pay

## 2021-06-19 ENCOUNTER — Encounter: Payer: Self-pay | Admitting: Internal Medicine

## 2021-06-19 VITALS — BP 137/60 | HR 58 | Temp 96.5°F | Resp 16 | Wt 161.9 lb

## 2021-06-19 DIAGNOSIS — C50312 Malignant neoplasm of lower-inner quadrant of left female breast: Secondary | ICD-10-CM

## 2021-06-19 DIAGNOSIS — Z170 Estrogen receptor positive status [ER+]: Secondary | ICD-10-CM | POA: Diagnosis present

## 2021-06-19 DIAGNOSIS — Z90710 Acquired absence of both cervix and uterus: Secondary | ICD-10-CM | POA: Insufficient documentation

## 2021-06-19 DIAGNOSIS — Z90721 Acquired absence of ovaries, unilateral: Secondary | ICD-10-CM | POA: Diagnosis not present

## 2021-06-19 DIAGNOSIS — D472 Monoclonal gammopathy: Secondary | ICD-10-CM | POA: Diagnosis present

## 2021-06-19 DIAGNOSIS — Z79811 Long term (current) use of aromatase inhibitors: Secondary | ICD-10-CM | POA: Diagnosis not present

## 2021-06-19 DIAGNOSIS — Z7982 Long term (current) use of aspirin: Secondary | ICD-10-CM | POA: Insufficient documentation

## 2021-06-19 DIAGNOSIS — Z853 Personal history of malignant neoplasm of breast: Secondary | ICD-10-CM

## 2021-06-19 DIAGNOSIS — C50212 Malignant neoplasm of upper-inner quadrant of left female breast: Secondary | ICD-10-CM

## 2021-06-19 DIAGNOSIS — Z79899 Other long term (current) drug therapy: Secondary | ICD-10-CM | POA: Insufficient documentation

## 2021-06-19 DIAGNOSIS — Z923 Personal history of irradiation: Secondary | ICD-10-CM | POA: Diagnosis not present

## 2021-06-19 DIAGNOSIS — I10 Essential (primary) hypertension: Secondary | ICD-10-CM | POA: Diagnosis not present

## 2021-06-19 LAB — COMPREHENSIVE METABOLIC PANEL
ALT: 14 U/L (ref 0–44)
AST: 22 U/L (ref 15–41)
Albumin: 3.7 g/dL (ref 3.5–5.0)
Alkaline Phosphatase: 97 U/L (ref 38–126)
Anion gap: 7 (ref 5–15)
BUN: 20 mg/dL (ref 8–23)
CO2: 24 mmol/L (ref 22–32)
Calcium: 9.1 mg/dL (ref 8.9–10.3)
Chloride: 107 mmol/L (ref 98–111)
Creatinine, Ser: 1.33 mg/dL — ABNORMAL HIGH (ref 0.44–1.00)
GFR, Estimated: 39 mL/min — ABNORMAL LOW (ref >60)
Glucose, Bld: 140 mg/dL — ABNORMAL HIGH (ref 70–99)
Potassium: 4 mmol/L (ref 3.5–5.1)
Sodium: 138 mmol/L (ref 135–145)
Total Bilirubin: 0.5 mg/dL (ref 0.3–1.2)
Total Protein: 7.8 g/dL (ref 6.5–8.1)

## 2021-06-19 LAB — CBC WITH DIFFERENTIAL/PLATELET
Abs Immature Granulocytes: 0.03 10*3/uL (ref 0.00–0.07)
Basophils Absolute: 0 10*3/uL (ref 0.0–0.1)
Basophils Relative: 1 %
Eosinophils Absolute: 0 10*3/uL (ref 0.0–0.5)
Eosinophils Relative: 1 %
HCT: 35.3 % — ABNORMAL LOW (ref 36.0–46.0)
Hemoglobin: 11.3 g/dL — ABNORMAL LOW (ref 12.0–15.0)
Immature Granulocytes: 1 %
Lymphocytes Relative: 25 %
Lymphs Abs: 1.6 10*3/uL (ref 0.7–4.0)
MCH: 29.8 pg (ref 26.0–34.0)
MCHC: 32 g/dL (ref 30.0–36.0)
MCV: 93.1 fL (ref 80.0–100.0)
Monocytes Absolute: 0.4 10*3/uL (ref 0.1–1.0)
Monocytes Relative: 6 %
Neutro Abs: 4.2 10*3/uL (ref 1.7–7.7)
Neutrophils Relative %: 66 %
Platelets: 212 10*3/uL (ref 150–400)
RBC: 3.79 MIL/uL — ABNORMAL LOW (ref 3.87–5.11)
RDW: 14.1 % (ref 11.5–15.5)
WBC: 6.2 10*3/uL (ref 4.0–10.5)
nRBC: 0 % (ref 0.0–0.2)

## 2021-06-19 NOTE — Progress Notes (Signed)
[facility] NOTE  Patient Care Team: Gauger, Victoriano Lain, NP as PCP - General (Internal Medicine) Clayburn Pert, MD as Consulting Physician (General Surgery)  CHIEF COMPLAINTS/PURPOSE OF CONSULTATION:  Breast cancer  #  Oncology History Overview Note   female with stage I left breast cancer s/p lumpectomy and sentinel lymph node biopsy on 09/17/2016.  Pathology revealed a 1.0 cm grade II invasive mammary carcinoma of no special type.  Margins were negative.  There was no tumor in 1 sentinel lymph node.  Pathologic stage was T1bN0M0.   Mammogram and ultrasound on 08/16/2016 revealed a 1.1 x 0.6 x 0.8 cm mass at the 10 o'clock position.  Left breast biopsy on 08/28/2016 revealed a 0.9 cm grade II invasive mammary carcinoma.  Tumor was ER positive (> 90%), PR negative, and Her2 2+ (negative by FISH).     She completed radiation on 11/28/2016.  She began Femara on 12/11/2016.     Bilateral diagnostic mammogram on 09/04/2020 revealed no evidence of malignancy.   Right sided mammogram and ultrasound on 02/19/2018 revealed a 0.6 x 0.7 x 0.7 cm mass at the 12:30 position 3 cm from the nipple.  Ultrasound guided biopsy right breast biopsy and clip placement on 02/27/2018 revealed a hypocellular, circumscribed hyalanized nodule with no atypia or malignancy.   Bone density study on 09/25/2016 was normal with a T score of 0.4 in the left femoral neck.  Bone density was normal on 09/28/2018 with a T score of 0.7 in the AP spine L1-L2, 0.1 in the left femoral neck, and 0.9 in the right forearm radius.   She has a monoclonal gammopathy of unknown significance (MGUS).  SPEP on 12/11/2016 revealed a 0.4 gm/dL monoclonal protein.  She has mild renal insufficiency (Cr 1.28; CrCl 45 ml/min).  Work-up on 12/18/2016 revealed an immunofixation with an IgG monoclonal protein with kappa light chain specificity.  IgG was 2081.  Free light chain ratio was 1.28 (normal).  Beta 2 microglobulin  was 2.4 (normal).  24 hour urine revealed no monoclonal protein or free light chains.  Bone survey on 01/08/2017 revealed no lytic lesions.   M-spike has been followed (gm/dL):  0.4 on 12/11/2016, 0.6 on 07/16/2017, 0.4 on 01/14/2018, 0.6 on 08/12/2018, 0.5 on 12/16/2018, 0.5 on 06/16/2019, 0.5 on 12/21/2019, and 0.7 on 06/20/2020.  IgG was 2041 on 12/18/2016, 1926 on 07/16/2017, and 1919 on 12/16/2018.     Malignant neoplasm of lower-inner quadrant of left breast in female, estrogen receptor positive ([place])  09/03/2016 Initial Diagnosis   Malignant neoplasm of lower-inner quadrant of left breast in female, estrogen receptor positive ([place])      HISTORY OF PRESENTING ILLNESS:  Erica Jackson 87 y.o.  female with breast cancer ER/PR positive HER2 negative stage I currently on adjuvant letrozole and MGUS is here for follow-up.  Patient denies any aches and pains.  No nausea vomiting.  Denies any hot flashes.  Appetite is good.  No significant weight loss.  Review of Systems  Constitutional:  Negative for chills, diaphoresis, fever, malaise/fatigue and weight loss.  HENT:  Negative for nosebleeds and sore throat.   Eyes:  Negative for double vision.  Respiratory:  Negative for cough, hemoptysis, sputum production, shortness of breath and wheezing.   Cardiovascular:  Negative for chest pain, palpitations, orthopnea and leg swelling.  Gastrointestinal:  Negative for abdominal pain, blood in stool, constipation, diarrhea, heartburn, melena, nausea and vomiting.  Genitourinary:  Negative for dysuria, frequency and urgency.  Musculoskeletal:  Negative for back pain and joint pain.  Skin: Negative.  Negative for itching and rash.  Neurological:  Negative for dizziness, tingling, focal weakness, weakness and headaches.  Endo/Heme/Allergies:  Does not bruise/bleed easily.  Psychiatric/Behavioral:  Negative for depression. The patient is not nervous/anxious and does not have insomnia.     MEDICAL  HISTORY:  Past Medical History:  Diagnosis Date  . Breast cancer ([place]) 2017   Lower inner quadrant left breast with lumpectomy and rad tx  . Breast cancer, left breast ([place]) 08/04/2016   Overview:  0.9 cm grade II invasive mammary carcinoma. Tumor was ER positive (> 90%), PR negative, and Her2 2+ (FISH negative)  . Cataract   . Dysrhythmia    no problems in a long time  . GERD (gastroesophageal reflux disease)   . Headache   . Hypercholesterolemia   . Hypertension   . Malignant neoplasm of upper-inner quadrant of left breast in female, estrogen receptor positive ([place])   . Personal history of radiation therapy 11/2016   for left breast ca    SURGICAL HISTORY: Past Surgical History:  Procedure Laterality Date  . ABDOMINAL HYSTERECTOMY    . BREAST BIOPSY Left 08/28/2016   invasive mammary ca  . BREAST BIOPSY Right 08/2017   HYALINIZED STROMA  . BREAST BIOPSY Right 03/2018   HYPOCELLULAR HYALINIZED NODULE  . BREAST EXCISIONAL BIOPSY Left 09/17/2016   lt breast ca  . BREAST LUMPECTOMY Left 09/2016   invasive mammary carcinoma with clear margins and rad tx  . BREAST LUMPECTOMY WITH SENTINEL LYMPH NODE BIOPSY Left 09/17/2016   Procedure: BREAST LUMPECTOMY WITH SENTINEL LYMPH NODE BX;  Surgeon: Clayburn Pert, MD;  Location: [facility] ORS;  Service: General;  Laterality: Left;  . EYE SURGERY Left 2015  . OOPHORECTOMY      SOCIAL HISTORY: Social History   Socioeconomic History  . Marital status: Married    Spouse name: Not on file  . Number of children: Not on file  . Years of education: Not on file  . Highest education level: Not on file  Occupational History  . Not on file  Tobacco Use  . Smoking status: Former    Packs/day: 1.00    Types: Cigarettes    Quit date: 09/09/1989    Years since quitting: 31.7  . Smokeless tobacco: Former    Quit date: 09/09/1989  Vaping Use  . Vaping Use: Not on file  Substance and Sexual Activity  . Alcohol use: No  . Drug use: No  .  Sexual activity: Not on file  Other Topics Concern  . Not on file  Social History Narrative  . Not on file   Social Determinants of Health   Financial Resource Strain: Not on file  Food Insecurity: Not on file  Transportation Needs: Not on file  Physical Activity: Not on file  Stress: Not on file  Social Connections: Not on file  Intimate Partner Violence: Not on file    FAMILY HISTORY: Family History  Problem Relation Age of Onset  . Stroke Mother   . Cancer Sister   . Cancer Brother   . Cancer Daughter   . Cancer Brother   . Cancer Brother   . Cancer Brother   . Cancer Brother   . Cancer Brother   . Breast cancer Neg Hx     ALLERGIES:  has No Known Allergies.  MEDICATIONS:  Current Outpatient Medications  Medication Sig Dispense Refill  . acetaminophen (TYLENOL) 650 MG CR tablet Take  1 tablet by mouth as needed.    Marland Kitchen aspirin EC 81 MG tablet Take 81 mg by mouth daily.     Marland Kitchen atenolol (TENORMIN) 25 MG tablet TAKE ONE TABLET BY MOUTH ONCE DAILY    . Cyanocobalamin (B-12) 1000 MCG TABS Take by mouth daily.    . felodipine (PLENDIL) 5 MG 24 hr tablet Take 5 mg by mouth daily.     . isosorbide dinitrate (ISORDIL) 20 MG tablet Take 20 mg by mouth daily.    Marland Kitchen letrozole (FEMARA) 2.5 MG tablet Take 1 tablet (2.5 mg total) by mouth daily. 90 tablet 0  . lovastatin (MEVACOR) 10 MG tablet TAKE ONE TABLET BY MOUTH ONCE DAILY WITH DINNER    . omeprazole (PRILOSEC) 20 MG capsule Take 20 mg by mouth daily.     . potassium chloride SA (K-DUR,KLOR-CON) 20 MEQ tablet Take 20 mEq by mouth daily.      No current facility-administered medications for this visit.      Marland Kitchen  PHYSICAL EXAMINATION: ECOG PERFORMANCE STATUS: 1 - Symptomatic but completely ambulatory  Vitals:   06/19/21 0846  BP: 137/60  Pulse: (!) 58  Resp: 16  Temp: (!) 96.5 F (35.8 C)  SpO2: 100%   Filed Weights   06/19/21 0846  Weight: 161 lb 14.9 oz (73.5 kg)    Physical Exam Vitals and nursing note  reviewed.  Constitutional:      Comments: Accompanied by her daughter.  Ambulating independently.  HENT:     Head: Normocephalic and atraumatic.     Mouth/Throat:     Pharynx: Oropharynx is clear.  Eyes:     Extraocular Movements: Extraocular movements intact.     Pupils: Pupils are equal, round, and reactive to light.  Cardiovascular:     Rate and Rhythm: Normal rate and regular rhythm.  Pulmonary:     Comments: Decreased breath sounds bilaterally.  Abdominal:     Palpations: Abdomen is soft.  Musculoskeletal:        General: Normal range of motion.     Cervical back: Normal range of motion.  Skin:    General: Skin is warm.  Neurological:     General: No focal deficit present.     Mental Status: She is alert and oriented to person, place, and time.  Psychiatric:        Behavior: Behavior normal.        Judgment: Judgment normal.    LABORATORY DATA:  I have reviewed the data as listed Lab Results  Component Value Date   WBC 6.2 06/19/2021   HGB 11.3 (L) 06/19/2021   HCT 35.3 (L) 06/19/2021   MCV 93.1 06/19/2021   PLT 212 06/19/2021   Recent Labs    06/20/20 1003 12/20/20 0926 06/19/21 0838  NA 139 138 138  K 4.1 3.5 4.0  CL 108 104 107  CO2 24 25 24   GLUCOSE 118* 103* 140*  BUN 18 14 20   CREATININE 1.26* 1.22* 1.33*  CALCIUM 8.8* 9.2 9.1  GFRNONAA 39* 43* 39*  GFRAA 45*  --   --   PROT 7.8 8.3* 7.8  ALBUMIN 3.6 3.8 3.7  AST 15 16 22   ALT 12 12 14   ALKPHOS 91 102 97  BILITOT 0.2* 0.3 0.5    RADIOGRAPHIC STUDIES: I have personally reviewed the radiological images as listed and agreed with the findings in the report. No results found.  ASSESSMENT & PLAN:   Malignant neoplasm of lower-inner quadrant of left breast in  female, estrogen receptor positive ([place]) #Stage I ER/PR positive HER2 negative breast cancer-s/p lumpectomy followed by radiation.  On letrozole [started spring 2018].  Mammogram 11-Jan-2020 November normal.  #Tolerating letrozole well-plan  until spring 2023.  2022 mammograms-pending PCP  #Bone density-2019 T score 0.7-continue calcium plus vitamin D   # monoclonal gammopathy of unknown significance (MGUS; sister died of MM)-July 01/10/2021 SPEP 0.5 g; again reviewed the extremely small risk of progression to active multiple myeloma.  However continue monitor every 6 months.    # DISPOSITION: # follow up in 6 months- MD; labs- cbc/cmp;MM panel; K/L light chains- Dr.B    All questions were answered. The patient knows to call the clinic with any problems, questions or concerns.       Cammie Sickle, MD 06/19/2021 1:10 PM

## 2021-06-19 NOTE — Assessment & Plan Note (Addendum)
#  Stage I ER/PR positive HER2 negative breast cancer-s/p lumpectomy followed by radiation.  On letrozole [started spring 2018].  Mammogram 02/06/20 November normal.  #Tolerating letrozole well-plan until spring 2023.  2022 mammograms-pending PCP  #Bone density-2019 T score 0.7-continue calcium plus vitamin D  # monoclonal gammopathy of unknown significance (MGUS; sister died of MM)-July 02/05/21 SPEP 0.5 g; again reviewed the extremely small risk of progression to active multiple myeloma.  However continue monitor every 6 months.   # DISPOSITION: # follow up in 6 months- MD; labs- cbc/cmp;MM panel; K/L light chains- Dr.B

## 2021-06-20 LAB — CANCER ANTIGEN 27.29: CA 27.29: 21.8 U/mL (ref 0.0–38.6)

## 2021-06-21 LAB — PROTEIN ELECTROPHORESIS, SERUM
A/G Ratio: 0.9 (ref 0.7–1.7)
Albumin ELP: 3.4 g/dL (ref 2.9–4.4)
Alpha-1-Globulin: 0.2 g/dL (ref 0.0–0.4)
Alpha-2-Globulin: 0.7 g/dL (ref 0.4–1.0)
Beta Globulin: 1.1 g/dL (ref 0.7–1.3)
Gamma Globulin: 1.8 g/dL (ref 0.4–1.8)
Globulin, Total: 3.8 g/dL (ref 2.2–3.9)
M-Spike, %: 0.6 g/dL — ABNORMAL HIGH
Total Protein ELP: 7.2 g/dL (ref 6.0–8.5)

## 2021-07-16 ENCOUNTER — Ambulatory Visit
Admission: RE | Admit: 2021-07-16 | Discharge: 2021-07-16 | Disposition: A | Discharge status: Home / Self Care | Payer: Medicare PPO | Source: Ambulatory Visit | Attending: Radiation Oncology | Admitting: Radiation Oncology

## 2021-07-16 ENCOUNTER — Other Ambulatory Visit: Payer: Self-pay | Admitting: Licensed Clinical Social Worker

## 2021-07-16 ENCOUNTER — Other Ambulatory Visit: Payer: Self-pay

## 2021-07-16 VITALS — BP 148/58 | HR 57 | Temp 97.4°F | Wt 161.0 lb

## 2021-07-16 DIAGNOSIS — Z923 Personal history of irradiation: Secondary | ICD-10-CM | POA: Diagnosis not present

## 2021-07-16 DIAGNOSIS — Z79811 Long term (current) use of aromatase inhibitors: Secondary | ICD-10-CM | POA: Insufficient documentation

## 2021-07-16 DIAGNOSIS — Z170 Estrogen receptor positive status [ER+]: Secondary | ICD-10-CM | POA: Insufficient documentation

## 2021-07-16 DIAGNOSIS — C50312 Malignant neoplasm of lower-inner quadrant of left female breast: Secondary | ICD-10-CM | POA: Diagnosis not present

## 2021-07-16 NOTE — Progress Notes (Signed)
Radiation Oncology Follow up Note  Name: Erica Jackson   Date:   07/16/2021 MRN:  [id number] DOB: 06/06/1934    This 87 y.o. female presents to the clinic today for 4 and half year follow-up status post whole breast radiation to her left breast for stage I ER positive invasive mammary carcinoma.  REFERRING PROVIDER: Sallee Lange, *  HPI: Patient is an 87 year old female now out close to 5 years having completed whole breast radiation to her left breast for stage I ER positive invasive mammary carcinoma.  Seen today in routine follow-up she is doing well.  She specifically denies breast tenderness cough or bone pain she still continues to have some slight tenderness in her left axilla.  She is currently on Femara tolerating it well without side effect..  She had mammograms last November which I have reviewed were BI-RADS 2 benign she is scheduled for follow-up mammograms this November.  COMPLICATIONS OF TREATMENT: none  FOLLOW UP COMPLIANCE: keeps appointments   PHYSICAL EXAM:  BP (!) 148/58   Pulse (!) 57   Temp (!) 97.4 F (36.3 C) (Tympanic)   Wt 161 lb (73 kg)   BMI 27.64 kg/m  Lungs are clear to A&P cardiac examination essentially unremarkable with regular rate and rhythm. No dominant mass or nodularity is noted in either breast in 2 positions examined. Incision is well-healed. No axillary or supraclavicular adenopathy is appreciated. Cosmetic result is excellent.  Well-developed well-nourished patient in NAD. HEENT reveals PERLA, EOMI, discs not visualized.  Oral cavity is clear. No oral mucosal lesions are identified. Neck is clear without evidence of cervical or supraclavicular adenopathy. Lungs are clear to A&P. Cardiac examination is essentially unremarkable with regular rate and rhythm without murmur rub or thrill. Abdomen is benign with no organomegaly or masses noted. Motor sensory and DTR levels are equal and symmetric in the upper and lower extremities. Cranial nerves II  through XII are grossly intact. Proprioception is intact. No peripheral adenopathy or edema is identified. No motor or sensory levels are noted. Crude visual fields are within normal range.  RADIOLOGY RESULTS: Mammograms reviewed compatible with above-stated findings  PLAN: Present time she continues to do well now close to 5 years out with no evidence of disease.  I am going to discontinue follow-up care.  Patient already has follow-up mammograms ordered.  She continues on Femara.  Patient knows to call with any concerns in the future.  I would like to take this opportunity to thank you for allowing me to participate in the care of your patient.Noreene Filbert, MD

## 2021-08-10 ENCOUNTER — Encounter: Payer: Self-pay | Admitting: General Surgery

## 2021-08-14 ENCOUNTER — Other Ambulatory Visit: Payer: Self-pay | Admitting: Internal Medicine

## 2021-08-14 DIAGNOSIS — C50212 Malignant neoplasm of upper-inner quadrant of left female breast: Secondary | ICD-10-CM

## 2021-08-14 DIAGNOSIS — Z170 Estrogen receptor positive status [ER+]: Secondary | ICD-10-CM

## 2021-09-05 ENCOUNTER — Other Ambulatory Visit: Payer: Self-pay

## 2021-09-05 ENCOUNTER — Ambulatory Visit
Admission: RE | Admit: 2021-09-05 | Discharge: 2021-09-05 | Disposition: A | Discharge status: Home / Self Care | Payer: Medicare PPO | Source: Ambulatory Visit | Attending: Nurse Practitioner | Admitting: Nurse Practitioner

## 2021-09-05 DIAGNOSIS — Z1231 Encounter for screening mammogram for malignant neoplasm of breast: Secondary | ICD-10-CM | POA: Diagnosis not present

## 2021-11-04 ENCOUNTER — Other Ambulatory Visit: Payer: Self-pay | Admitting: Internal Medicine

## 2021-11-04 DIAGNOSIS — C50212 Malignant neoplasm of upper-inner quadrant of left female breast: Secondary | ICD-10-CM

## 2021-12-18 ENCOUNTER — Inpatient Hospital Stay: Payer: Medicare PPO

## 2021-12-18 ENCOUNTER — Inpatient Hospital Stay: Payer: Medicare PPO | Admitting: Internal Medicine

## 2021-12-19 ENCOUNTER — Encounter: Payer: Self-pay | Admitting: Internal Medicine

## 2021-12-19 ENCOUNTER — Inpatient Hospital Stay: Payer: Medicare PPO | Admitting: Internal Medicine

## 2021-12-19 ENCOUNTER — Other Ambulatory Visit: Payer: Self-pay

## 2021-12-19 ENCOUNTER — Inpatient Hospital Stay: Payer: Medicare PPO | Attending: Internal Medicine

## 2021-12-19 DIAGNOSIS — D472 Monoclonal gammopathy: Secondary | ICD-10-CM | POA: Insufficient documentation

## 2021-12-19 DIAGNOSIS — Z79899 Other long term (current) drug therapy: Secondary | ICD-10-CM | POA: Insufficient documentation

## 2021-12-19 DIAGNOSIS — Z7982 Long term (current) use of aspirin: Secondary | ICD-10-CM | POA: Diagnosis not present

## 2021-12-19 DIAGNOSIS — Z87891 Personal history of nicotine dependence: Secondary | ICD-10-CM | POA: Diagnosis not present

## 2021-12-19 DIAGNOSIS — C50312 Malignant neoplasm of lower-inner quadrant of left female breast: Secondary | ICD-10-CM | POA: Diagnosis not present

## 2021-12-19 DIAGNOSIS — Z923 Personal history of irradiation: Secondary | ICD-10-CM | POA: Diagnosis not present

## 2021-12-19 DIAGNOSIS — Z170 Estrogen receptor positive status [ER+]: Secondary | ICD-10-CM

## 2021-12-19 DIAGNOSIS — Z79811 Long term (current) use of aromatase inhibitors: Secondary | ICD-10-CM | POA: Diagnosis not present

## 2021-12-19 LAB — COMPREHENSIVE METABOLIC PANEL
ALT: 12 U/L (ref 0–44)
AST: 16 U/L (ref 15–41)
Albumin: 3.7 g/dL (ref 3.5–5.0)
Alkaline Phosphatase: 104 U/L (ref 38–126)
Anion gap: 8 (ref 5–15)
BUN: 16 mg/dL (ref 8–23)
CO2: 23 mmol/L (ref 22–32)
Calcium: 8.9 mg/dL (ref 8.9–10.3)
Chloride: 106 mmol/L (ref 98–111)
Creatinine, Ser: 1.11 mg/dL — ABNORMAL HIGH (ref 0.44–1.00)
GFR, Estimated: 48 mL/min — ABNORMAL LOW (ref >60)
Glucose, Bld: 128 mg/dL — ABNORMAL HIGH (ref 70–99)
Potassium: 3.5 mmol/L (ref 3.5–5.1)
Sodium: 137 mmol/L (ref 135–145)
Total Bilirubin: 0.1 mg/dL — ABNORMAL LOW (ref 0.3–1.2)
Total Protein: 8.1 g/dL (ref 6.5–8.1)

## 2021-12-19 LAB — CBC WITH DIFFERENTIAL/PLATELET
Abs Immature Granulocytes: 0.04 10*3/uL (ref 0.00–0.07)
Basophils Absolute: 0 10*3/uL (ref 0.0–0.1)
Basophils Relative: 1 %
Eosinophils Absolute: 0.1 10*3/uL (ref 0.0–0.5)
Eosinophils Relative: 1 %
HCT: 37.4 % (ref 36.0–46.0)
Hemoglobin: 11.7 g/dL — ABNORMAL LOW (ref 12.0–15.0)
Immature Granulocytes: 1 %
Lymphocytes Relative: 28 %
Lymphs Abs: 2.1 10*3/uL (ref 0.7–4.0)
MCH: 29.8 pg (ref 26.0–34.0)
MCHC: 31.3 g/dL (ref 30.0–36.0)
MCV: 95.2 fL (ref 80.0–100.0)
Monocytes Absolute: 0.4 10*3/uL (ref 0.1–1.0)
Monocytes Relative: 6 %
Neutro Abs: 4.8 10*3/uL (ref 1.7–7.7)
Neutrophils Relative %: 63 %
Platelets: 224 10*3/uL (ref 150–400)
RBC: 3.93 MIL/uL (ref 3.87–5.11)
RDW: 14.3 % (ref 11.5–15.5)
WBC: 7.5 10*3/uL (ref 4.0–10.5)
nRBC: 0 % (ref 0.0–0.2)

## 2021-12-19 NOTE — Assessment & Plan Note (Addendum)
#  Stage I ER/PR positive HER2 negative breast cancer-s/p lumpectomy followed by radiation.  On letrozole [started spring 2018].  Mammogram NOV 01/26/2021- WNL; Tolerating letrozole well-plan until spring 2023 [remaining pills- spring 2023].  Patient has finished close to 6 years of endocrine therapy.  Given her low low risk disease-risk of recurrence is quite small.  #Bone density-2019 T score 0.7-continue calcium plus vitamin D; repeat BMD in 6 months/ordered.  # monoclonal gammopathy of unknown significance (MGUS; sister died of MM)-July Jan 26, 2021 SPEP 0.5 g; again reviewed the extremely small risk of progression to active multiple myeloma.  However continue monitor every 6 months.  Labs from today are pending.  # DISPOSITION: # follow up in 6 months- MD; labs- cbc/cmp;MM panel; K/L light chains; BMD prior- Dr.B

## 2021-12-19 NOTE — Progress Notes (Signed)
Patient denies new problems/concerns today.   °

## 2021-12-19 NOTE — Progress Notes (Signed)
[place] NOTE  Patient Care Team: Gauger, Victoriano Lain, NP as PCP - General (Internal Medicine) Clayburn Pert, MD as Consulting Physician (General Surgery)  CHIEF COMPLAINTS/PURPOSE OF CONSULTATION:  Breast cancer  #  Oncology History Overview Note   female with stage I left breast cancer s/p lumpectomy and sentinel lymph node biopsy on 09/17/2016.  Pathology revealed a 1.0 cm grade II invasive mammary carcinoma of no special type.  Margins were negative.  There was no tumor in 1 sentinel lymph node.  Pathologic stage was T1bN0M0.   Mammogram and ultrasound on 08/16/2016 revealed a 1.1 x 0.6 x 0.8 cm mass at the 10 o'clock position.  Left breast biopsy on 08/28/2016 revealed a 0.9 cm grade II invasive mammary carcinoma.  Tumor was ER positive (> 90%), PR negative, and Her2 2+ (negative by FISH).     She completed radiation on 11/28/2016.  She began Femara on 12/11/2016; STOPPED [place], 2023     Bilateral diagnostic mammogram on 09/04/2020 revealed no evidence of malignancy.   Right sided mammogram and ultrasound on 02/19/2018 revealed a 0.6 x 0.7 x 0.7 cm mass at the 12:30 position 3 cm from the nipple.  Ultrasound guided biopsy right breast biopsy and clip placement on 02/27/2018 revealed a hypocellular, circumscribed hyalanized nodule with no atypia or malignancy.   Bone density study on 09/25/2016 was normal with a T score of 0.4 in the left femoral neck.  Bone density was normal on 09/28/2018 with a T score of 0.7 in the AP spine L1-L2, 0.1 in the left femoral neck, and 0.9 in the right forearm radius.   She has a monoclonal gammopathy of unknown significance (MGUS).  SPEP on 12/11/2016 revealed a 0.4 gm/dL monoclonal protein.  She has mild renal insufficiency (Cr 1.28; CrCl 45 ml/min).  Work-up on 12/18/2016 revealed an immunofixation with an IgG monoclonal protein with kappa light chain specificity.  IgG was 2081.  Free light chain ratio was 1.28  (normal).  Beta 2 microglobulin was 2.4 (normal).  24 hour urine revealed no monoclonal protein or free light chains.  Bone survey on 01/08/2017 revealed no lytic lesions.   M-spike has been followed (gm/dL):  0.4 on 12/11/2016, 0.6 on 07/16/2017, 0.4 on 01/14/2018, 0.6 on 08/12/2018, 0.5 on 12/16/2018, 0.5 on 06/16/2019, 0.5 on 12/21/2019, and 0.7 on 06/20/2020.  IgG was 2041 on 12/18/2016, 1926 on 07/16/2017, and 1919 on 12/16/2018.     Malignant neoplasm of lower-inner quadrant of left breast in female, estrogen receptor positive ([place])  09/03/2016 Initial Diagnosis   Malignant neoplasm of lower-inner quadrant of left breast in female, estrogen receptor positive ([place])      HISTORY OF PRESENTING ILLNESS: Ambulating independently.  Accompanied by her daughter. Terrace Arabia Lorman 88 y.o.  female with breast cancer ER/PR positive HER2 negative stage I currently on adjuvant letrozole and MGUS is here for follow-up.  Patient denies any aches and pains.  No nausea vomiting.  Denies any hot flashes.  Appetite is good.  No significant weight loss.  Review of Systems  Constitutional:  Negative for chills, diaphoresis, fever, malaise/fatigue and weight loss.  HENT:  Negative for nosebleeds and sore throat.   Eyes:  Negative for double vision.  Respiratory:  Negative for cough, hemoptysis, sputum production, shortness of breath and wheezing.   Cardiovascular:  Negative for chest pain, palpitations, orthopnea and leg swelling.  Gastrointestinal:  Negative for abdominal pain, blood in stool, constipation, diarrhea, heartburn, melena, nausea and vomiting.  Genitourinary:  Negative for dysuria, frequency and urgency.  Musculoskeletal:  Negative for back pain and joint pain.  Skin: Negative.  Negative for itching and rash.  Neurological:  Negative for dizziness, tingling, focal weakness, weakness and headaches.  Endo/Heme/Allergies:  Does not bruise/bleed easily.  Psychiatric/Behavioral:  Negative for  depression. The patient is not nervous/anxious and does not have insomnia.     MEDICAL HISTORY:  Past Medical History:  Diagnosis Date   Breast cancer ([place]) 2017   Lower inner quadrant left breast with lumpectomy and rad tx   Breast cancer, left breast ([place]) 08/04/2016   Overview:  0.9 cm grade II invasive mammary carcinoma. Tumor was ER positive (> 90%), PR negative, and Her2 2+ (FISH negative)   Cataract    Dysrhythmia    no problems in a long time   GERD (gastroesophageal reflux disease)    Headache    Hypercholesterolemia    Hypertension    Malignant neoplasm of upper-inner quadrant of left breast in female, estrogen receptor positive ([place])    Personal history of radiation therapy 11/2016   for left breast ca    SURGICAL HISTORY: Past Surgical History:  Procedure Laterality Date   ABDOMINAL HYSTERECTOMY     BREAST BIOPSY Left 08/28/2016   invasive mammary ca   BREAST BIOPSY Right 08/2017   HYALINIZED STROMA   BREAST BIOPSY Right 03/2018   HYPOCELLULAR HYALINIZED NODULE   BREAST EXCISIONAL BIOPSY Left 09/17/2016   lt breast ca   BREAST LUMPECTOMY Left 09/2016   invasive mammary carcinoma with clear margins and rad tx   BREAST LUMPECTOMY WITH SENTINEL LYMPH NODE BIOPSY Left 09/17/2016   Procedure: BREAST LUMPECTOMY WITH SENTINEL LYMPH NODE BX;  Surgeon: Clayburn Pert, MD;  Location: [facility] ORS;  Service: General;  Laterality: Left;   EYE SURGERY Left 2015   OOPHORECTOMY      SOCIAL HISTORY: Social History   Socioeconomic History   Marital status: Married    Spouse name: Not on file   Number of children: Not on file   Years of education: Not on file   Highest education level: Not on file  Occupational History   Not on file  Tobacco Use   Smoking status: Former    Packs/day: 1.00    Types: Cigarettes    Quit date: 09/09/1989    Years since quitting: 32.2   Smokeless tobacco: Former    Quit date: 09/09/1989  Vaping Use   Vaping Use: Not on file  Substance and  Sexual Activity   Alcohol use: No   Drug use: No   Sexual activity: Not on file  Other Topics Concern   Not on file  Social History Narrative   Not on file   Social Determinants of Health   Financial Resource Strain: Not on file  Food Insecurity: Not on file  Transportation Needs: Not on file  Physical Activity: Not on file  Stress: Not on file  Social Connections: Not on file  Intimate Partner Violence: Not on file    FAMILY HISTORY: Family History  Problem Relation Age of Onset   Stroke Mother    Cancer Sister    Cancer Brother    Cancer Daughter    Cancer Brother    Cancer Brother    Cancer Brother    Cancer Brother    Cancer Brother    Breast cancer Neg Hx     ALLERGIES:  has No Known Allergies.  MEDICATIONS:  Current Outpatient Medications  Medication Sig Dispense Refill  acetaminophen (TYLENOL) 650 MG CR tablet Take 1 tablet by mouth as needed.     aspirin EC 81 MG tablet Take 81 mg by mouth daily.      atenolol (TENORMIN) 25 MG tablet TAKE ONE TABLET BY MOUTH ONCE DAILY     clobetasol ointment (TEMOVATE) 0.05 % Apply topically 2 (two) times daily.     Cyanocobalamin (B-12) 1000 MCG TABS Take by mouth daily.     felodipine (PLENDIL) 5 MG 24 hr tablet Take 2.5 mg by mouth daily.     isosorbide dinitrate (ISORDIL) 20 MG tablet Take 10 mg by mouth daily.     letrozole (FEMARA) 2.5 MG tablet Take 1 tablet by mouth once daily 90 tablet 0   lovastatin (MEVACOR) 10 MG tablet TAKE ONE TABLET BY MOUTH ONCE DAILY WITH DINNER     omeprazole (PRILOSEC) 20 MG capsule Take 20 mg by mouth daily.      potassium chloride SA (K-DUR,KLOR-CON) 20 MEQ tablet Take 20 mEq by mouth daily.      No current facility-administered medications for this visit.      Marland Kitchen  PHYSICAL EXAMINATION: ECOG PERFORMANCE STATUS: 1 - Symptomatic but completely ambulatory  Vitals:   12/19/21 1500  BP: 139/73  Pulse: 67  Resp: 16  Temp: 97.7 F (36.5 C)   Filed Weights   12/19/21 1500   Weight: 161 lb (73 kg)    Physical Exam Vitals and nursing note reviewed.  HENT:     Head: Normocephalic and atraumatic.     Mouth/Throat:     Pharynx: Oropharynx is clear.  Eyes:     Extraocular Movements: Extraocular movements intact.     Pupils: Pupils are equal, round, and reactive to light.  Cardiovascular:     Rate and Rhythm: Normal rate and regular rhythm.  Pulmonary:     Comments: Decreased breath sounds bilaterally.  Abdominal:     Palpations: Abdomen is soft.  Musculoskeletal:        General: Normal range of motion.     Cervical back: Normal range of motion.  Skin:    General: Skin is warm.  Neurological:     General: No focal deficit present.     Mental Status: She is alert and oriented to person, place, and time.  Psychiatric:        Behavior: Behavior normal.        Judgment: Judgment normal.    LABORATORY DATA:  I have reviewed the data as listed Lab Results  Component Value Date   WBC 7.5 12/19/2021   HGB 11.7 (L) 12/19/2021   HCT 37.4 12/19/2021   MCV 95.2 12/19/2021   PLT 224 12/19/2021   Recent Labs    12/20/20 0926 06/19/21 0838 12/19/21 1507  NA 138 138 137  K 3.5 4.0 3.5  CL 104 107 106  CO2 25 24 23   GLUCOSE 103* 140* 128*  BUN 14 20 16   CREATININE 1.22* 1.33* 1.11*  CALCIUM 9.2 9.1 8.9  GFRNONAA 43* 39* 48*  PROT 8.3* 7.8 8.1  ALBUMIN 3.8 3.7 3.7  AST 16 22 16   ALT 12 14 12   ALKPHOS 102 97 104  BILITOT 0.3 0.5 0.1*    RADIOGRAPHIC STUDIES: I have personally reviewed the radiological images as listed and agreed with the findings in the report. No results found.  ASSESSMENT & PLAN:   Malignant neoplasm of lower-inner quadrant of left breast in female, estrogen receptor positive ([place]) #Stage I ER/PR positive HER2 negative breast cancer-s/p  lumpectomy followed by radiation.  On letrozole [started spring 2018].  Mammogram NOV Jan 28, 2021- WNL; Tolerating letrozole well-plan until spring 2023 [remaining pills- spring 2023].  Patient  has finished close to 6 years of endocrine therapy.  Given her low low risk disease-risk of recurrence is quite small.  #Bone density-2019 T score 0.7-continue calcium plus vitamin D; repeat BMD in 6 months/ordered.   # monoclonal gammopathy of unknown significance (MGUS; sister died of MM)-July 01-28-2021 SPEP 0.5 g; again reviewed the extremely small risk of progression to active multiple myeloma.  However continue monitor every 6 months.  Labs from today are pending.   # DISPOSITION: # follow up in 6 months- MD; labs- cbc/cmp;MM panel; K/L light chains; BMD prior- Dr.B    All questions were answered. The patient knows to call the clinic with any problems, questions or concerns.       Cammie Sickle, MD 12/19/2021 4:31 PM

## 2021-12-20 LAB — KAPPA/LAMBDA LIGHT CHAINS
Kappa free light chain: 67.5 mg/L — ABNORMAL HIGH (ref 3.3–19.4)
Kappa, lambda light chain ratio: 1.91 — ABNORMAL HIGH (ref 0.26–1.65)
Lambda free light chains: 35.4 mg/L — ABNORMAL HIGH (ref 5.7–26.3)

## 2021-12-24 LAB — MULTIPLE MYELOMA PANEL, SERUM
Albumin SerPl Elph-Mcnc: 3.5 g/dL (ref 2.9–4.4)
Albumin/Glob SerPl: 0.9 (ref 0.7–1.7)
Alpha 1: 0.2 g/dL (ref 0.0–0.4)
Alpha2 Glob SerPl Elph-Mcnc: 0.7 g/dL (ref 0.4–1.0)
B-Globulin SerPl Elph-Mcnc: 1.1 g/dL (ref 0.7–1.3)
Gamma Glob SerPl Elph-Mcnc: 2 g/dL — ABNORMAL HIGH (ref 0.4–1.8)
Globulin, Total: 4 g/dL — ABNORMAL HIGH (ref 2.2–3.9)
IgA: 181 mg/dL (ref 64–422)
IgG (Immunoglobin G), Serum: 2218 mg/dL — ABNORMAL HIGH (ref 586–1602)
IgM (Immunoglobulin M), Srm: 36 mg/dL (ref 26–217)
M Protein SerPl Elph-Mcnc: 0.8 g/dL — ABNORMAL HIGH
Total Protein ELP: 7.5 g/dL (ref 6.0–8.5)

## 2022-01-27 ENCOUNTER — Other Ambulatory Visit: Payer: Self-pay | Admitting: Internal Medicine

## 2022-01-27 DIAGNOSIS — Z170 Estrogen receptor positive status [ER+]: Secondary | ICD-10-CM

## 2022-01-27 DIAGNOSIS — C50212 Malignant neoplasm of upper-inner quadrant of left female breast: Secondary | ICD-10-CM

## 2022-01-28 NOTE — Telephone Encounter (Signed)
ASSESSMENT & PLAN:  ?  ?Malignant neoplasm of lower-inner quadrant of left breast in female, estrogen receptor positive ([place]) ?#Stage I ER/PR positive HER2 negative breast cancer-s/p lumpectomy followed by radiation.  On letrozole [started spring 2018].  Mammogram NOV 12/26/20- WNL; Tolerating letrozole well-plan until spring 2023 [remaining pills- spring 2023].  Patient has finished close to 6 years of endocrine therapy.  Given her low low risk disease-risk of recurrence is quite small. ?  ?#Bone density-2019 T score 0.7-continue calcium plus vitamin D; repeat BMD in 6 months/ordered. ?  ?# monoclonal gammopathy of unknown significance (MGUS; sister died of MM)-July 12-26-2020 SPEP 0.5 g; again reviewed the extremely small risk of progression to active multiple myeloma.  However continue monitor every 6 months.  Labs from today are pending. ?  ?# DISPOSITION: ?# follow up in 6 months- MD; labs- cbc/cmp;MM panel; K/L light chains; BMD prior- Dr.B ?

## 2022-06-11 ENCOUNTER — Ambulatory Visit
Admission: RE | Admit: 2022-06-11 | Discharge: 2022-06-11 | Disposition: A | Discharge status: Home / Self Care | Payer: Medicare PPO | Source: Ambulatory Visit | Attending: Internal Medicine | Admitting: Internal Medicine

## 2022-06-11 DIAGNOSIS — Z780 Asymptomatic menopausal state: Secondary | ICD-10-CM | POA: Diagnosis not present

## 2022-06-11 DIAGNOSIS — C50312 Malignant neoplasm of lower-inner quadrant of left female breast: Secondary | ICD-10-CM | POA: Diagnosis present

## 2022-06-11 DIAGNOSIS — Z170 Estrogen receptor positive status [ER+]: Secondary | ICD-10-CM | POA: Diagnosis present

## 2022-06-19 ENCOUNTER — Inpatient Hospital Stay: Payer: Medicare PPO | Admitting: Internal Medicine

## 2022-06-19 ENCOUNTER — Encounter: Payer: Self-pay | Admitting: Internal Medicine

## 2022-06-19 ENCOUNTER — Inpatient Hospital Stay: Payer: Medicare PPO | Attending: Internal Medicine

## 2022-06-19 DIAGNOSIS — Z7982 Long term (current) use of aspirin: Secondary | ICD-10-CM | POA: Insufficient documentation

## 2022-06-19 DIAGNOSIS — C50312 Malignant neoplasm of lower-inner quadrant of left female breast: Secondary | ICD-10-CM | POA: Insufficient documentation

## 2022-06-19 DIAGNOSIS — D649 Anemia, unspecified: Secondary | ICD-10-CM | POA: Diagnosis not present

## 2022-06-19 DIAGNOSIS — D472 Monoclonal gammopathy: Secondary | ICD-10-CM | POA: Diagnosis present

## 2022-06-19 DIAGNOSIS — Z79899 Other long term (current) drug therapy: Secondary | ICD-10-CM | POA: Diagnosis not present

## 2022-06-19 DIAGNOSIS — Z170 Estrogen receptor positive status [ER+]: Secondary | ICD-10-CM | POA: Insufficient documentation

## 2022-06-19 DIAGNOSIS — Z923 Personal history of irradiation: Secondary | ICD-10-CM | POA: Insufficient documentation

## 2022-06-19 DIAGNOSIS — Z79811 Long term (current) use of aromatase inhibitors: Secondary | ICD-10-CM | POA: Insufficient documentation

## 2022-06-19 DIAGNOSIS — Z87891 Personal history of nicotine dependence: Secondary | ICD-10-CM | POA: Diagnosis not present

## 2022-06-19 LAB — CBC WITH DIFFERENTIAL/PLATELET
Abs Immature Granulocytes: 0.03 10*3/uL (ref 0.00–0.07)
Basophils Absolute: 0 10*3/uL (ref 0.0–0.1)
Basophils Relative: 1 %
Eosinophils Absolute: 0.1 10*3/uL (ref 0.0–0.5)
Eosinophils Relative: 1 %
HCT: 37.5 % (ref 36.0–46.0)
Hemoglobin: 11.6 g/dL — ABNORMAL LOW (ref 12.0–15.0)
Immature Granulocytes: 0 %
Lymphocytes Relative: 26 %
Lymphs Abs: 1.8 10*3/uL (ref 0.7–4.0)
MCH: 29.9 pg (ref 26.0–34.0)
MCHC: 30.9 g/dL (ref 30.0–36.0)
MCV: 96.6 fL (ref 80.0–100.0)
Monocytes Absolute: 0.4 10*3/uL (ref 0.1–1.0)
Monocytes Relative: 6 %
Neutro Abs: 4.7 10*3/uL (ref 1.7–7.7)
Neutrophils Relative %: 66 %
Platelets: 239 10*3/uL (ref 150–400)
RBC: 3.88 MIL/uL (ref 3.87–5.11)
RDW: 14.5 % (ref 11.5–15.5)
WBC: 7.1 10*3/uL (ref 4.0–10.5)
nRBC: 0 % (ref 0.0–0.2)

## 2022-06-19 LAB — COMPREHENSIVE METABOLIC PANEL
ALT: 12 U/L (ref 0–44)
AST: 14 U/L — ABNORMAL LOW (ref 15–41)
Albumin: 3.8 g/dL (ref 3.5–5.0)
Alkaline Phosphatase: 101 U/L (ref 38–126)
Anion gap: 7 (ref 5–15)
BUN: 17 mg/dL (ref 8–23)
CO2: 25 mmol/L (ref 22–32)
Calcium: 8.9 mg/dL (ref 8.9–10.3)
Chloride: 106 mmol/L (ref 98–111)
Creatinine, Ser: 1.45 mg/dL — ABNORMAL HIGH (ref 0.44–1.00)
GFR, Estimated: 35 mL/min — ABNORMAL LOW (ref >60)
Glucose, Bld: 97 mg/dL (ref 70–99)
Potassium: 3.9 mmol/L (ref 3.5–5.1)
Sodium: 138 mmol/L (ref 135–145)
Total Bilirubin: 0.3 mg/dL (ref 0.3–1.2)
Total Protein: 8.5 g/dL — ABNORMAL HIGH (ref 6.5–8.1)

## 2022-06-19 NOTE — Assessment & Plan Note (Addendum)
#  Stage I ER/PR positive HER2 negative breast cancer-s/p lumpectomy followed by radiation- [place] spring 2018]. Tolerating letrozole well-s/p stopping spring 2023.  BIL Mammogram NOV 2022- WNL;  # Continue surveillance off any therapy at this time.  Mammogram due November 2023.  Ordered.  #Bone density- [AUG 2023]-  BMD T-score of -0.3. continue calcium plus vitamin D;STABLE; reviewed the bone density results in detail.  # monoclonal gammopathy of unknown significance (MGUS; sister died of MM)-FEB, 2023-SPEP 0.8 g; again reviewed the extremely small risk of progression to active multiple myeloma.  However continue monitor every 6 months.  Mild anemia-chronic hemoglobin around 11; stage III kidney disease-stable.MM labs from today are pending.  # DISPOSITION: # Bil screening mammogram in nov 2023 # follow up in 6 months- MD;PRIOR 1 week-  labs- cbc/cmp;MM panel; K/L light chains;- - Dr.B

## 2022-06-19 NOTE — Progress Notes (Signed)
Patient denies new problems/concerns today.   °

## 2022-06-19 NOTE — Progress Notes (Signed)
[name]  Patient Care Team: Gauger, Victoriano Lain, NP as PCP - General (Internal Medicine) Clayburn Pert, MD as Consulting Physician (General Surgery)  CHIEF COMPLAINTS/PURPOSE OF CONSULTATION:  Breast cancer  #  Oncology History Overview Note   female with stage I left breast cancer s/p lumpectomy and sentinel lymph node biopsy on 09/17/2016.  Pathology revealed a 1.0 cm grade II invasive mammary carcinoma of no special type.  Margins were negative.  There was no tumor in 1 sentinel lymph node.  Pathologic stage was T1bN0M0.   Mammogram and ultrasound on 08/16/2016 revealed a 1.1 x 0.6 x 0.8 cm mass at the 10 o'clock position.  Left breast biopsy on 08/28/2016 revealed a 0.9 cm grade II invasive mammary carcinoma.  Tumor was ER positive (> 90%), PR negative, and Her2 2+ (negative by FISH).     She completed radiation on 11/28/2016.  She began Femara on 12/11/2016; STOPPED [place], 2023     Bilateral diagnostic mammogram on 09/04/2020 revealed no evidence of malignancy.   Right sided mammogram and ultrasound on 02/19/2018 revealed a 0.6 x 0.7 x 0.7 cm mass at the 12:30 position 3 cm from the nipple.  Ultrasound guided biopsy right breast biopsy and clip placement on 02/27/2018 revealed a hypocellular, circumscribed hyalanized nodule with no atypia or malignancy.   Bone density study on 09/25/2016 was normal with a T score of 0.4 in the left femoral neck.  Bone density was normal on 09/28/2018 with a T score of 0.7 in the AP spine L1-L2, 0.1 in the left femoral neck, and 0.9 in the right forearm radius.   She has a monoclonal gammopathy of unknown significance (MGUS).  SPEP on 12/11/2016 revealed a 0.4 gm/dL monoclonal protein.  She has mild renal insufficiency (Cr 1.28; CrCl 45 ml/min).  Work-up on 12/18/2016 revealed an immunofixation with an IgG monoclonal protein with kappa light chain specificity.  IgG was 2081.  Free light chain ratio was 1.28  (normal).  Beta 2 microglobulin was 2.4 (normal).  24 hour urine revealed no monoclonal protein or free light chains.  Bone survey on 01/08/2017 revealed no lytic lesions.   M-spike has been followed (gm/dL):  0.4 on 12/11/2016, 0.6 on 07/16/2017, 0.4 on 01/14/2018, 0.6 on 08/12/2018, 0.5 on 12/16/2018, 0.5 on 06/16/2019, 0.5 on 12/21/2019, and 0.7 on 06/20/2020.  IgG was 2041 on 12/18/2016, 1926 on 07/16/2017, and 1919 on 12/16/2018.     Malignant neoplasm of lower-inner quadrant of left breast in female, estrogen receptor positive ([place])  09/03/2016 Initial Diagnosis   Malignant neoplasm of lower-inner quadrant of left breast in female, estrogen receptor positive ([place])      HISTORY OF PRESENTING ILLNESS: Ambulating independently.  Accompanied by her daughter.  Erica Jackson 88 y.o.  female with breast cancer ER/PR positive HER2 negative stage I  s/p adjuvant letrozole [finished letrozole spring 2023] and MGUS is here for follow-up.  Patient denies any aches and pains.  No nausea vomiting.  Denies any hot flashes.  Appetite is good.  No significant weight loss.  Review of Systems  Constitutional:  Negative for chills, diaphoresis, fever, malaise/fatigue and weight loss.  HENT:  Negative for nosebleeds and sore throat.   Eyes:  Negative for double vision.  Respiratory:  Negative for cough, hemoptysis, sputum production, shortness of breath and wheezing.   Cardiovascular:  Negative for chest pain, palpitations, orthopnea and leg swelling.  Gastrointestinal:  Negative for abdominal pain, blood in stool, constipation, diarrhea, heartburn, melena,  nausea and vomiting.  Genitourinary:  Negative for dysuria, frequency and urgency.  Musculoskeletal:  Negative for back pain and joint pain.  Skin: Negative.  Negative for itching and rash.  Neurological:  Negative for dizziness, tingling, focal weakness, weakness and headaches.  Endo/Heme/Allergies:  Does not bruise/bleed easily.   Psychiatric/Behavioral:  Negative for depression. The patient is not nervous/anxious and does not have insomnia.      MEDICAL HISTORY:  Past Medical History:  Diagnosis Date   Breast cancer ([place]) 2017   Lower inner quadrant left breast with lumpectomy and rad tx   Breast cancer, left breast ([place]) 08/04/2016   Overview:  0.9 cm grade II invasive mammary carcinoma. Tumor was ER positive (> 90%), PR negative, and Her2 2+ (FISH negative)   Cataract    Dysrhythmia    no problems in a long time   GERD (gastroesophageal reflux disease)    Headache    Hypercholesterolemia    Hypertension    Malignant neoplasm of upper-inner quadrant of left breast in female, estrogen receptor positive ([place])    Personal history of radiation therapy 11/2016   for left breast ca    SURGICAL HISTORY: Past Surgical History:  Procedure Laterality Date   ABDOMINAL HYSTERECTOMY     BREAST BIOPSY Left 08/28/2016   invasive mammary ca   BREAST BIOPSY Right 08/2017   HYALINIZED STROMA   BREAST BIOPSY Right 03/2018   HYPOCELLULAR HYALINIZED NODULE   BREAST EXCISIONAL BIOPSY Left 09/17/2016   lt breast ca   BREAST LUMPECTOMY Left 09/2016   invasive mammary carcinoma with clear margins and rad tx   BREAST LUMPECTOMY WITH SENTINEL LYMPH NODE BIOPSY Left 09/17/2016   Procedure: BREAST LUMPECTOMY WITH SENTINEL LYMPH NODE BX;  Surgeon: Clayburn Pert, MD;  Location: [facility] ORS;  Service: General;  Laterality: Left;   EYE SURGERY Left 2015   OOPHORECTOMY      SOCIAL HISTORY: Social History   Socioeconomic History   Marital status: Married    Spouse name: Not on file   Number of children: Not on file   Years of education: Not on file   Highest education level: Not on file  Occupational History   Not on file  Tobacco Use   Smoking status: Former    Packs/day: 1.00    Types: Cigarettes    Quit date: 09/09/1989    Years since quitting: 32.7   Smokeless tobacco: Former    Quit date: 09/09/1989  Vaping Use    Vaping Use: Not on file  Substance and Sexual Activity   Alcohol use: No   Drug use: No   Sexual activity: Not on file  Other Topics Concern   Not on file  Social History Narrative   Not on file   Social Determinants of Health   Financial Resource Strain: Not on file  Food Insecurity: Not on file  Transportation Needs: No Transportation Needs (06/19/2022)   PRAPARE - Transportation    Lack of Transportation (Medical): No    Lack of Transportation (Non-Medical): No  Physical Activity: Not on file  Stress: Not on file  Social Connections: Not on file  Intimate Partner Violence: Not on file    FAMILY HISTORY: Family History  Problem Relation Age of Onset   Stroke Mother    Cancer Sister    Cancer Brother    Cancer Daughter    Cancer Brother    Cancer Brother    Cancer Brother    Cancer Brother    Cancer  Brother    Breast cancer Neg Hx     ALLERGIES:  has No Known Allergies.  MEDICATIONS:  Current Outpatient Medications  Medication Sig Dispense Refill   acetaminophen (TYLENOL) 650 MG CR tablet Take 1 tablet by mouth as needed.     aspirin EC 81 MG tablet Take 81 mg by mouth daily.      atenolol (TENORMIN) 25 MG tablet TAKE ONE TABLET BY MOUTH ONCE DAILY     clobetasol ointment (TEMOVATE) 0.05 % Apply topically 2 (two) times daily.     Cyanocobalamin (B-12) 1000 MCG TABS Take by mouth daily.     felodipine (PLENDIL) 5 MG 24 hr tablet Take 2.5 mg by mouth daily.     isosorbide dinitrate (ISORDIL) 20 MG tablet Take 10 mg by mouth daily.     letrozole (FEMARA) 2.5 MG tablet Take 1 tablet by mouth once daily 90 tablet 0   lovastatin (MEVACOR) 10 MG tablet TAKE ONE TABLET BY MOUTH ONCE DAILY WITH DINNER     omeprazole (PRILOSEC) 20 MG capsule Take 20 mg by mouth daily.      potassium chloride SA (K-DUR,KLOR-CON) 20 MEQ tablet Take 20 mEq by mouth daily.      No current facility-administered medications for this visit.      Marland Kitchen  PHYSICAL EXAMINATION: ECOG  PERFORMANCE STATUS: 1 - Symptomatic but completely ambulatory  Vitals:   06/19/22 1400  BP: (!) 151/65  Pulse: (!) 56  Resp: 16  Temp: (!) 97.2 F (36.2 C)   Filed Weights   06/19/22 1400  Weight: 167 lb 6.4 oz (75.9 kg)    Physical Exam Vitals and nursing note reviewed.  HENT:     Head: Normocephalic and atraumatic.     Mouth/Throat:     Pharynx: Oropharynx is clear.  Eyes:     Extraocular Movements: Extraocular movements intact.     Pupils: Pupils are equal, round, and reactive to light.  Cardiovascular:     Rate and Rhythm: Normal rate and regular rhythm.  Pulmonary:     Comments: Decreased breath sounds bilaterally.  Abdominal:     Palpations: Abdomen is soft.  Musculoskeletal:        General: Normal range of motion.     Cervical back: Normal range of motion.  Skin:    General: Skin is warm.  Neurological:     General: No focal deficit present.     Mental Status: She is alert and oriented to person, place, and time.  Psychiatric:        Behavior: Behavior normal.        Judgment: Judgment normal.     LABORATORY DATA:  I have reviewed the data as listed Lab Results  Component Value Date   WBC 7.1 06/19/2022   HGB 11.6 (L) 06/19/2022   HCT 37.5 06/19/2022   MCV 96.6 06/19/2022   PLT 239 06/19/2022   Recent Labs    12/19/21 1507 06/19/22 1423  NA 137 138  K 3.5 3.9  CL 106 106  CO2 23 25  GLUCOSE 128* 97  BUN 16 17  CREATININE 1.11* 1.45*  CALCIUM 8.9 8.9  GFRNONAA 48* 35*  PROT 8.1 8.5*  ALBUMIN 3.7 3.8  AST 16 14*  ALT 12 12  ALKPHOS 104 101  BILITOT 0.1* 0.3    RADIOGRAPHIC STUDIES: I have personally reviewed the radiological images as listed and agreed with the findings in the report. DG Bone Density  Result Date: 06/11/2022 EXAM: DUAL X-RAY ABSORPTIOMETRY (  DXA) FOR BONE MINERAL DENSITY IMPRESSION: Your patient Erica Jackson completed a BMD test on 06/11/2022 using the [place] (analysis version: 14.10)  manufactured by [employer]. The following summarizes the results of our evaluation. Technologist: LCE PATIENT BIOGRAPHICAL: Name: Erica Jackson, Erica Jackson Patient ID: [id number] Birth Date: 12/11/1933 Height: 62.0 in. Gender: Female Exam Date: 06/11/2022 Weight: 169.4 lbs. Indications: POSTmenopausal, Hysterectomy, Bilateral Ovariectomy, hx breast ca, Advanced Age, high risk meds, Height Loss Fractures: Treatments: 81 MG ASPIRIN ASSESSMENT: The BMD measured at Femur Neck Right is 0.995 g/cm2 with a T-score of -0.3. This patient is considered normal according to [place] [facility]) criteria. Compared with the prior study on 09/28/2018, the BMD of the total mean shows a statistically significant decrease. L-3 and L-4 were excluded due to degenerative changes. The scan quality is good. Site Region Measured Measured WHO Young Adult BMD Date       Age      Classification T-score AP Spine L1-L2 8/11/[age] 86.4 Normal 0.4 1.227 g/cm2 AP Spine L1-L2 09/28/2018 82.7 Normal 0.6 1.248 g/cm2 AP Spine L1-L2 09/25/2016 80.7 Normal 1.0 1.299 g/cm2 DualFemur Neck Right 06/11/2022 86.4 Normal -0.3 0.995 g/cm2 DualFemur Neck Right 09/28/2018 82.7 Normal 0.3 1.076 g/cm2 DualFemur Neck Right 09/25/2016 80.7 Normal 0.4 1.089 g/cm2 DualFemur Total Mean 06/11/2022 86.4 Normal 0.8 1.112 g/cm2 DualFemur Total Mean 09/28/2018 82.7 Normal 1.2 1.154 g/cm2 DualFemur Total Mean 09/25/2016 80.7 Normal 1.4 1.188 g/cm2 World Health Organization [facility]) criteria for post-menopausal, Caucasian Women: Normal:       T-score at or above -1 SD Osteopenia:   T-score between -1 and -2.5 SD Osteoporosis: T-score at or below -2.5 SD RECOMMENDATIONS: 1. All patients should optimize calcium and vitamin D intake. 2. Consider FDA-approved medical therapies in postmenopausal women and men aged 2 years and older, based on the following: a. A hip or vertebral (clinical or morphometric) fracture b. T-score < -2.5 at the femoral neck or spine after appropriate  evaluation to exclude secondary causes c. Low bone mass (T-score between -1.0 and -2.5 at the femoral neck or spine) and a 10-year probability of a hip fracture > 3% or a 10-year probability of a major osteoporosis-related fracture > 20% based on the [place]-adapted WHO algorithm d. Clinician judgment and/or patient preferences may indicate treatment for people with 10-year fracture probabilities above or below these levels FOLLOW-UP: People with diagnosed cases of osteoporosis or at high risk for fracture should have regular bone mineral density tests. For patients eligible for Medicare, routine testing is allowed once every 2 years. The testing frequency can be increased to one year for patients who have rapidly progressing disease, those who are receiving or discontinuing medical therapy to restore bone mass, or have additional risk factors. I have reviewed this report, and agree with the above findings. [facility] Radiology Electronically Signed   By: Ammie Ferrier M.D.   On: 06/11/2022 10:30    ASSESSMENT & PLAN:   Malignant neoplasm of lower-inner quadrant of left breast in female, estrogen receptor positive ([place]) #Stage I ER/PR positive HER2 negative breast cancer-s/p lumpectomy followed by radiation- [place] spring 2018]. Tolerating letrozole well-s/p stopping spring 2023.  BIL Mammogram NOV 2022- WNL;  # Continue surveillance off any therapy at this time.  Mammogram due November 2023.  Ordered.  #Bone density- [AUG 2023]-  BMD T-score of -0.3. continue calcium plus vitamin D;STABLE; reviewed the bone density results in detail.   # monoclonal gammopathy of unknown significance (MGUS; sister died of MM)-FEB, 2023-SPEP  0.8 g; again reviewed the extremely small risk of progression to active multiple myeloma.  However continue monitor every 6 months.  Mild anemia-chronic hemoglobin around 11; stage III kidney disease-stable.MM labs from today are pending.   # DISPOSITION: # Bil screening mammogram  in nov 2023 # follow up in 6 months- MD;PRIOR 1 week-  labs- cbc/cmp;MM panel; K/L light chains;- - Dr.B    All questions were answered. The patient knows to call the clinic with any problems, questions or concerns.       Cammie Sickle, MD 06/19/2022 3:23 PM

## 2022-06-20 LAB — KAPPA/LAMBDA LIGHT CHAINS
Kappa free light chain: 63.8 mg/L — ABNORMAL HIGH (ref 3.3–19.4)
Kappa, lambda light chain ratio: 2.02 — ABNORMAL HIGH (ref 0.26–1.65)
Lambda free light chains: 31.6 mg/L — ABNORMAL HIGH (ref 5.7–26.3)

## 2022-06-21 LAB — MULTIPLE MYELOMA PANEL, SERUM
Albumin SerPl Elph-Mcnc: 3.6 g/dL (ref 2.9–4.4)
Albumin/Glob SerPl: 1 (ref 0.7–1.7)
Alpha 1: 0.2 g/dL (ref 0.0–0.4)
Alpha2 Glob SerPl Elph-Mcnc: 0.7 g/dL (ref 0.4–1.0)
B-Globulin SerPl Elph-Mcnc: 1.1 g/dL (ref 0.7–1.3)
Gamma Glob SerPl Elph-Mcnc: 2.1 g/dL — ABNORMAL HIGH (ref 0.4–1.8)
Globulin, Total: 4 g/dL — ABNORMAL HIGH (ref 2.2–3.9)
IgA: 187 mg/dL (ref 64–422)
IgG (Immunoglobin G), Serum: 2409 mg/dL — ABNORMAL HIGH (ref 586–1602)
IgM (Immunoglobulin M), Srm: 38 mg/dL (ref 26–217)
M Protein SerPl Elph-Mcnc: 0.9 g/dL — ABNORMAL HIGH
Total Protein ELP: 7.6 g/dL (ref 6.0–8.5)

## 2022-09-06 ENCOUNTER — Ambulatory Visit
Admission: RE | Admit: 2022-09-06 | Discharge: 2022-09-06 | Disposition: A | Discharge status: Home / Self Care | Payer: Medicare PPO | Source: Ambulatory Visit | Attending: Internal Medicine | Admitting: Internal Medicine

## 2022-09-06 DIAGNOSIS — Z170 Estrogen receptor positive status [ER+]: Secondary | ICD-10-CM | POA: Insufficient documentation

## 2022-09-06 DIAGNOSIS — Z1231 Encounter for screening mammogram for malignant neoplasm of breast: Secondary | ICD-10-CM | POA: Diagnosis not present

## 2022-09-06 DIAGNOSIS — C50312 Malignant neoplasm of lower-inner quadrant of left female breast: Secondary | ICD-10-CM | POA: Diagnosis not present

## 2022-11-07 DIAGNOSIS — H2512 Age-related nuclear cataract, left eye: Secondary | ICD-10-CM | POA: Diagnosis not present

## 2022-11-07 DIAGNOSIS — Z961 Presence of intraocular lens: Secondary | ICD-10-CM | POA: Diagnosis not present

## 2022-11-07 DIAGNOSIS — Z0100 Encounter for examination of eyes and vision without abnormal findings: Secondary | ICD-10-CM | POA: Diagnosis not present

## 2022-11-26 DIAGNOSIS — R32 Unspecified urinary incontinence: Secondary | ICD-10-CM | POA: Diagnosis not present

## 2022-11-26 DIAGNOSIS — N189 Chronic kidney disease, unspecified: Secondary | ICD-10-CM | POA: Diagnosis not present

## 2022-11-26 DIAGNOSIS — E876 Hypokalemia: Secondary | ICD-10-CM | POA: Diagnosis not present

## 2022-11-26 DIAGNOSIS — K219 Gastro-esophageal reflux disease without esophagitis: Secondary | ICD-10-CM | POA: Diagnosis not present

## 2022-11-26 DIAGNOSIS — H259 Unspecified age-related cataract: Secondary | ICD-10-CM | POA: Diagnosis not present

## 2022-11-26 DIAGNOSIS — K5900 Constipation, unspecified: Secondary | ICD-10-CM | POA: Diagnosis not present

## 2022-11-26 DIAGNOSIS — E1122 Type 2 diabetes mellitus with diabetic chronic kidney disease: Secondary | ICD-10-CM | POA: Diagnosis not present

## 2022-11-26 DIAGNOSIS — E669 Obesity, unspecified: Secondary | ICD-10-CM | POA: Diagnosis not present

## 2022-11-26 DIAGNOSIS — E785 Hyperlipidemia, unspecified: Secondary | ICD-10-CM | POA: Diagnosis not present

## 2022-12-20 ENCOUNTER — Inpatient Hospital Stay: Payer: Medicare PPO | Attending: Internal Medicine

## 2022-12-20 DIAGNOSIS — Z79899 Other long term (current) drug therapy: Secondary | ICD-10-CM | POA: Insufficient documentation

## 2022-12-20 DIAGNOSIS — Z923 Personal history of irradiation: Secondary | ICD-10-CM | POA: Diagnosis not present

## 2022-12-20 DIAGNOSIS — Z79811 Long term (current) use of aromatase inhibitors: Secondary | ICD-10-CM | POA: Diagnosis not present

## 2022-12-20 DIAGNOSIS — Z170 Estrogen receptor positive status [ER+]: Secondary | ICD-10-CM

## 2022-12-20 DIAGNOSIS — Z87891 Personal history of nicotine dependence: Secondary | ICD-10-CM | POA: Insufficient documentation

## 2022-12-20 DIAGNOSIS — Z7982 Long term (current) use of aspirin: Secondary | ICD-10-CM | POA: Diagnosis not present

## 2022-12-20 DIAGNOSIS — C50312 Malignant neoplasm of lower-inner quadrant of left female breast: Secondary | ICD-10-CM | POA: Diagnosis not present

## 2022-12-20 DIAGNOSIS — Z90721 Acquired absence of ovaries, unilateral: Secondary | ICD-10-CM | POA: Insufficient documentation

## 2022-12-20 DIAGNOSIS — D472 Monoclonal gammopathy: Secondary | ICD-10-CM | POA: Insufficient documentation

## 2022-12-20 DIAGNOSIS — Z90710 Acquired absence of both cervix and uterus: Secondary | ICD-10-CM | POA: Diagnosis not present

## 2022-12-20 DIAGNOSIS — D649 Anemia, unspecified: Secondary | ICD-10-CM | POA: Diagnosis not present

## 2022-12-20 LAB — COMPREHENSIVE METABOLIC PANEL
ALT: 13 U/L (ref 0–44)
AST: 18 U/L (ref 15–41)
Albumin: 3.6 g/dL (ref 3.5–5.0)
Alkaline Phosphatase: 99 U/L (ref 38–126)
Anion gap: 6 (ref 5–15)
BUN: 14 mg/dL (ref 8–23)
CO2: 23 mmol/L (ref 22–32)
Calcium: 8.7 mg/dL — ABNORMAL LOW (ref 8.9–10.3)
Chloride: 106 mmol/L (ref 98–111)
Creatinine, Ser: 1.05 mg/dL — ABNORMAL HIGH (ref 0.44–1.00)
GFR, Estimated: 51 mL/min — ABNORMAL LOW (ref >60)
Glucose, Bld: 138 mg/dL — ABNORMAL HIGH (ref 70–99)
Potassium: 3.7 mmol/L (ref 3.5–5.1)
Sodium: 135 mmol/L (ref 135–145)
Total Bilirubin: 0.5 mg/dL (ref 0.3–1.2)
Total Protein: 7.8 g/dL (ref 6.5–8.1)

## 2022-12-20 LAB — CBC WITH DIFFERENTIAL/PLATELET
Abs Immature Granulocytes: 0.03 10*3/uL (ref 0.00–0.07)
Basophils Absolute: 0 10*3/uL (ref 0.0–0.1)
Basophils Relative: 1 %
Eosinophils Absolute: 0.1 10*3/uL (ref 0.0–0.5)
Eosinophils Relative: 1 %
HCT: 37.4 % (ref 36.0–46.0)
Hemoglobin: 11.6 g/dL — ABNORMAL LOW (ref 12.0–15.0)
Immature Granulocytes: 0 %
Lymphocytes Relative: 18 %
Lymphs Abs: 1.3 10*3/uL (ref 0.7–4.0)
MCH: 30 pg (ref 26.0–34.0)
MCHC: 31 g/dL (ref 30.0–36.0)
MCV: 96.6 fL (ref 80.0–100.0)
Monocytes Absolute: 0.3 10*3/uL (ref 0.1–1.0)
Monocytes Relative: 5 %
Neutro Abs: 5.4 10*3/uL (ref 1.7–7.7)
Neutrophils Relative %: 75 %
Platelets: 218 10*3/uL (ref 150–400)
RBC: 3.87 MIL/uL (ref 3.87–5.11)
RDW: 14.1 % (ref 11.5–15.5)
WBC: 7.1 10*3/uL (ref 4.0–10.5)
nRBC: 0 % (ref 0.0–0.2)

## 2022-12-23 ENCOUNTER — Inpatient Hospital Stay: Payer: Medicare PPO | Admitting: Internal Medicine

## 2022-12-23 ENCOUNTER — Encounter: Payer: Self-pay | Admitting: Internal Medicine

## 2022-12-23 VITALS — BP 170/77 | HR 65 | Temp 96.9°F | Resp 16 | Wt 168.8 lb

## 2022-12-23 DIAGNOSIS — D649 Anemia, unspecified: Secondary | ICD-10-CM | POA: Diagnosis not present

## 2022-12-23 DIAGNOSIS — D472 Monoclonal gammopathy: Secondary | ICD-10-CM | POA: Diagnosis not present

## 2022-12-23 DIAGNOSIS — Z170 Estrogen receptor positive status [ER+]: Secondary | ICD-10-CM

## 2022-12-23 DIAGNOSIS — Z7982 Long term (current) use of aspirin: Secondary | ICD-10-CM | POA: Diagnosis not present

## 2022-12-23 DIAGNOSIS — C50312 Malignant neoplasm of lower-inner quadrant of left female breast: Secondary | ICD-10-CM

## 2022-12-23 DIAGNOSIS — Z87891 Personal history of nicotine dependence: Secondary | ICD-10-CM | POA: Diagnosis not present

## 2022-12-23 DIAGNOSIS — Z79811 Long term (current) use of aromatase inhibitors: Secondary | ICD-10-CM | POA: Diagnosis not present

## 2022-12-23 DIAGNOSIS — Z923 Personal history of irradiation: Secondary | ICD-10-CM | POA: Diagnosis not present

## 2022-12-23 LAB — KAPPA/LAMBDA LIGHT CHAINS
Kappa free light chain: 65.3 mg/L — ABNORMAL HIGH (ref 3.3–19.4)
Kappa, lambda light chain ratio: 2.29 — ABNORMAL HIGH (ref 0.26–1.65)
Lambda free light chains: 28.5 mg/L — ABNORMAL HIGH (ref 5.7–26.3)

## 2022-12-23 NOTE — Progress Notes (Signed)
[place] NOTE  Patient Care Team: Gauger, Victoriano Lain, NP as PCP - General (Internal Medicine) Clayburn Pert, MD as Consulting Physician (General Surgery)  CHIEF COMPLAINTS/PURPOSE OF CONSULTATION: Breast cancer  #  Oncology History Overview Note   female with stage I left breast cancer s/p lumpectomy and sentinel lymph node biopsy on 09/17/2016.  Pathology revealed a 1.0 cm grade II invasive mammary carcinoma of no special type.  Margins were negative.  There was no tumor in 1 sentinel lymph node.  Pathologic stage was T1bN0M0.   Mammogram and ultrasound on 08/16/2016 revealed a 1.1 x 0.6 x 0.8 cm mass at the 10 o'clock position.  Left breast biopsy on 08/28/2016 revealed a 0.9 cm grade II invasive mammary carcinoma.  Tumor was ER positive (> 90%), PR negative, and Her2 2+ (negative by FISH).     She completed radiation on 11/28/2016.  She began Femara on 12/11/2016; STOPPED [place], 2023     Bilateral diagnostic mammogram on 09/04/2020 revealed no evidence of malignancy.   Right sided mammogram and ultrasound on 02/19/2018 revealed a 0.6 x 0.7 x 0.7 cm mass at the 12:30 position 3 cm from the nipple.  Ultrasound guided biopsy right breast biopsy and clip placement on 02/27/2018 revealed a hypocellular, circumscribed hyalanized nodule with no atypia or malignancy.   Bone density study on 09/25/2016 was normal with a T score of 0.4 in the left femoral neck.  Bone density was normal on 09/28/2018 with a T score of 0.7 in the AP spine L1-L2, 0.1 in the left femoral neck, and 0.9 in the right forearm radius.   She has a monoclonal gammopathy of unknown significance (MGUS).  SPEP on 12/11/2016 revealed a 0.4 gm/dL monoclonal protein.  She has mild renal insufficiency (Cr 1.28; CrCl 45 ml/min).  Work-up on 12/18/2016 revealed an immunofixation with an IgG monoclonal protein with kappa light chain specificity.  IgG was 2081.  Free light chain ratio was 1.28  (normal).  Beta 2 microglobulin was 2.4 (normal).  24 hour urine revealed no monoclonal protein or free light chains.  Bone survey on 01/08/2017 revealed no lytic lesions.   M-spike has been followed (gm/dL):  0.4 on 12/11/2016, 0.6 on 07/16/2017, 0.4 on 01/14/2018, 0.6 on 08/12/2018, 0.5 on 12/16/2018, 0.5 on 06/16/2019, 0.5 on 12/21/2019, and 0.7 on 06/20/2020.  IgG was 2041 on 12/18/2016, 1926 on 07/16/2017, and 1919 on 12/16/2018.     Malignant neoplasm of lower-inner quadrant of left breast in female, estrogen receptor positive ([place])  09/03/2016 Initial Diagnosis   Malignant neoplasm of lower-inner quadrant of left breast in female, estrogen receptor positive ([place])    HISTORY OF PRESENTING ILLNESS: Ambulating independently.  Accompanied by her daughter.  Erica Jackson 89 y.o.  female with breast cancer ER/PR positive HER2 negative stage I  s/p adjuvant letrozole [finished letrozole spring 2023] and MGUS is here for follow-up.  Patient denies any aches and pains.  No nausea vomiting.  Denies any hot flashes.  Appetite is good.  No significant weight loss.  Review of Systems  Constitutional:  Negative for chills, diaphoresis, fever, malaise/fatigue and weight loss.  HENT:  Negative for nosebleeds and sore throat.   Eyes:  Negative for double vision.  Respiratory:  Negative for cough, hemoptysis, sputum production, shortness of breath and wheezing.   Cardiovascular:  Negative for chest pain, palpitations, orthopnea and leg swelling.  Gastrointestinal:  Negative for abdominal pain, blood in stool, constipation, diarrhea, heartburn, melena, nausea and vomiting.  Genitourinary:  Negative for dysuria, frequency and urgency.  Musculoskeletal:  Negative for back pain and joint pain.  Skin: Negative.  Negative for itching and rash.  Neurological:  Negative for dizziness, tingling, focal weakness, weakness and headaches.  Endo/Heme/Allergies:  Does not bruise/bleed easily.   Psychiatric/Behavioral:  Negative for depression. The patient is not nervous/anxious and does not have insomnia.      MEDICAL HISTORY:  Past Medical History:  Diagnosis Date   Breast cancer ([place]) 2017   Lower inner quadrant left breast with lumpectomy and rad tx   Breast cancer, left breast ([place]) 08/04/2016   Overview:  0.9 cm grade II invasive mammary carcinoma. Tumor was ER positive (> 90%), PR negative, and Her2 2+ (FISH negative)   Cataract    Dysrhythmia    no problems in a long time   GERD (gastroesophageal reflux disease)    Headache    Hypercholesterolemia    Hypertension    Malignant neoplasm of upper-inner quadrant of left breast in female, estrogen receptor positive ([place])    Personal history of radiation therapy 11/2016   for left breast ca    SURGICAL HISTORY: Past Surgical History:  Procedure Laterality Date   ABDOMINAL HYSTERECTOMY     BREAST BIOPSY Left 08/28/2016   invasive mammary ca   BREAST BIOPSY Right 08/2017   HYALINIZED STROMA   BREAST BIOPSY Right 03/2018   HYPOCELLULAR HYALINIZED NODULE   BREAST EXCISIONAL BIOPSY Left 09/17/2016   lt breast ca   BREAST LUMPECTOMY Left 09/2016   invasive mammary carcinoma with clear margins and rad tx   BREAST LUMPECTOMY WITH SENTINEL LYMPH NODE BIOPSY Left 09/17/2016   Procedure: BREAST LUMPECTOMY WITH SENTINEL LYMPH NODE BX;  Surgeon: Clayburn Pert, MD;  Location: [facility] ORS;  Service: General;  Laterality: Left;   EYE SURGERY Left 2015   OOPHORECTOMY      SOCIAL HISTORY: Social History   Socioeconomic History   Marital status: Married    Spouse name: Not on file   Number of children: Not on file   Years of education: Not on file   Highest education level: Not on file  Occupational History   Not on file  Tobacco Use   Smoking status: Former    Packs/day: 1.00    Types: Cigarettes    Quit date: 09/09/1989    Years since quitting: 33.3   Smokeless tobacco: Former    Quit date: 09/09/1989  Vaping Use    Vaping Use: Not on file  Substance and Sexual Activity   Alcohol use: No   Drug use: No   Sexual activity: Not on file  Other Topics Concern   Not on file  Social History Narrative   Not on file   Social Determinants of Health   Financial Resource Strain: Not on file  Food Insecurity: Not on file  Transportation Needs: No Transportation Needs (06/19/2022)   PRAPARE - Transportation    Lack of Transportation (Medical): No    Lack of Transportation (Non-Medical): No  Physical Activity: Not on file  Stress: Not on file  Social Connections: Not on file  Intimate Partner Violence: Not on file    FAMILY HISTORY: Family History  Problem Relation Age of Onset   Stroke Mother    Cancer Sister    Cancer Brother    Cancer Daughter    Cancer Brother    Cancer Brother    Cancer Brother    Cancer Brother    Cancer Brother  Breast cancer Neg Hx     ALLERGIES:  has No Known Allergies.  MEDICATIONS:  Current Outpatient Medications  Medication Sig Dispense Refill   acetaminophen (TYLENOL) 650 MG CR tablet Take 1 tablet by mouth as needed.     aspirin EC 81 MG tablet Take 81 mg by mouth daily.      atenolol (TENORMIN) 25 MG tablet TAKE ONE TABLET BY MOUTH ONCE DAILY     Cyanocobalamin (B-12) 1000 MCG TABS Take by mouth daily.     felodipine (PLENDIL) 5 MG 24 hr tablet Take 2.5 mg by mouth daily.     isosorbide dinitrate (ISORDIL) 20 MG tablet Take 10 mg by mouth daily.     lovastatin (MEVACOR) 10 MG tablet TAKE ONE TABLET BY MOUTH ONCE DAILY WITH DINNER     omeprazole (PRILOSEC) 20 MG capsule Take 20 mg by mouth daily.      potassium chloride SA (K-DUR,KLOR-CON) 20 MEQ tablet Take 20 mEq by mouth daily.      letrozole (FEMARA) 2.5 MG tablet Take 1 tablet by mouth once daily (Patient not taking: Reported on 12/23/2022) 90 tablet 0   No current facility-administered medications for this visit.      Marland Kitchen  PHYSICAL EXAMINATION: ECOG PERFORMANCE STATUS: 1 - Symptomatic but  completely ambulatory  Vitals:   12/23/22 1500 12/23/22 1502  BP: (!) 187/71 (!) 170/77  Pulse: 64 65  Resp: 16   Temp: (!) 96.9 F (36.1 C)    Filed Weights   12/23/22 1500  Weight: 168 lb 12.8 oz (76.6 kg)    Physical Exam Vitals and nursing note reviewed.  HENT:     Head: Normocephalic and atraumatic.     Mouth/Throat:     Pharynx: Oropharynx is clear.  Eyes:     Extraocular Movements: Extraocular movements intact.     Pupils: Pupils are equal, round, and reactive to light.  Cardiovascular:     Rate and Rhythm: Normal rate and regular rhythm.  Pulmonary:     Comments: Decreased breath sounds bilaterally.  Abdominal:     Palpations: Abdomen is soft.  Musculoskeletal:        General: Normal range of motion.     Cervical back: Normal range of motion.  Skin:    General: Skin is warm.  Neurological:     General: No focal deficit present.     Mental Status: She is alert and oriented to person, place, and time.  Psychiatric:        Behavior: Behavior normal.        Judgment: Judgment normal.     LABORATORY DATA:  I have reviewed the data as listed Lab Results  Component Value Date   WBC 7.1 12/20/2022   HGB 11.6 (L) 12/20/2022   HCT 37.4 12/20/2022   MCV 96.6 12/20/2022   PLT 218 12/20/2022   Recent Labs    06/19/22 1423 12/20/22 0904  NA 138 135  K 3.9 3.7  CL 106 106  CO2 25 23  GLUCOSE 97 138*  BUN 17 14  CREATININE 1.45* 1.05*  CALCIUM 8.9 8.7*  GFRNONAA 35* 51*  PROT 8.5* 7.8  ALBUMIN 3.8 3.6  AST 14* 18  ALT 12 13  ALKPHOS 101 99  BILITOT 0.3 0.5    RADIOGRAPHIC STUDIES: I have personally reviewed the radiological images as listed and agreed with the findings in the report. No results found.  ASSESSMENT & PLAN:   Malignant neoplasm of lower-inner quadrant of left breast in  female, estrogen receptor positive ([place]) #Stage I ER/PR positive HER2 negative breast cancer-s/p lumpectomy followed by radiation- [facility] spring 2018]. Finished   spring 2023.    # Continue surveillance off any therapy at this time.  Mammogram due November 2023- WNL.   # Hypocalcemia: ca 8.5- recommend ca+ vit D BID.   #Bone density- [AUG 2023]-  BMD T-score of -0.3. continue calcium plus vitamin D;STABLE; reviewed the bone density results in detail.   # monoclonal gammopathy of unknown significance (MGUS; sister died of MM)-AUG 2023-SPEP 0.9 g; again reviewed the extremely small risk of progression to active multiple myeloma. K/Lratio=2.0.  However continue monitor every 6 months.  Mild anemia-chronic hemoglobin around 11; stage III kidney disease-stable. MM labs from today are pending.   # DISPOSITION:   # follow up in 6 months- MD; PRIOR 2 week-  labs- cbc/cmp;MM panel; K/L light chains;vit D- 25-OH-- Dr.B  All questions were answered. The patient knows to call the clinic with any problems, questions or concerns.    Cammie Sickle, MD 12/23/2022 3:24 PM

## 2022-12-23 NOTE — Progress Notes (Signed)
Patient still has soreness in left axillary.  Occasional dizziness with standing.    BP 187/71, HR 64 sitting BP 170-77, HR 65 standing  Reports that she did take mer bp meds this morning.

## 2022-12-23 NOTE — Assessment & Plan Note (Addendum)
#  Stage I ER/PR positive HER2 negative breast cancer-s/p lumpectomy followed by radiation- [facility] spring 2018]. Finished  spring 2023.    # Continue surveillance off any therapy at this time.  Mammogram due November 2023- WNL.   # Hypocalcemia: ca 8.5- recommend ca+ vit D BID.   #Bone density- [AUG 2023]-  BMD T-score of -0.3. continue calcium plus vitamin D;STABLE; reviewed the bone density results in detail.   # monoclonal gammopathy of unknown significance (MGUS; sister died of MM)-AUG 2023-SPEP 0.9 g; again reviewed the extremely small risk of progression to active multiple myeloma. K/Lratio=2.0.  However continue monitor every 6 months.  Mild anemia-chronic hemoglobin around 11; stage III kidney disease-stable. MM labs from today are pending.   # DISPOSITION:   # follow up in 6 months- MD; PRIOR 2 week-  labs- cbc/cmp;MM panel; K/L light chains;vit D- 25-OH-- Dr.B

## 2022-12-23 NOTE — Patient Instructions (Signed)
#   Recommend vitamin D plus calcium [approximately 1000 mg each] once a day.

## 2022-12-26 LAB — MULTIPLE MYELOMA PANEL, SERUM
Albumin SerPl Elph-Mcnc: 3.4 g/dL (ref 2.9–4.4)
Albumin/Glob SerPl: 0.9 (ref 0.7–1.7)
Alpha 1: 0.2 g/dL (ref 0.0–0.4)
Alpha2 Glob SerPl Elph-Mcnc: 0.7 g/dL (ref 0.4–1.0)
B-Globulin SerPl Elph-Mcnc: 1.1 g/dL (ref 0.7–1.3)
Gamma Glob SerPl Elph-Mcnc: 2 g/dL — ABNORMAL HIGH (ref 0.4–1.8)
Globulin, Total: 4 g/dL — ABNORMAL HIGH (ref 2.2–3.9)
IgA: 180 mg/dL (ref 64–422)
IgG (Immunoglobin G), Serum: 2232 mg/dL — ABNORMAL HIGH (ref 586–1602)
IgM (Immunoglobulin M), Srm: 35 mg/dL (ref 26–217)
M Protein SerPl Elph-Mcnc: 0.8 g/dL — ABNORMAL HIGH
Total Protein ELP: 7.4 g/dL (ref 6.0–8.5)

## 2023-01-08 DIAGNOSIS — Z79899 Other long term (current) drug therapy: Secondary | ICD-10-CM | POA: Diagnosis not present

## 2023-01-08 DIAGNOSIS — I2510 Atherosclerotic heart disease of native coronary artery without angina pectoris: Secondary | ICD-10-CM | POA: Diagnosis not present

## 2023-01-08 DIAGNOSIS — I129 Hypertensive chronic kidney disease with stage 1 through stage 4 chronic kidney disease, or unspecified chronic kidney disease: Secondary | ICD-10-CM | POA: Diagnosis not present

## 2023-01-08 DIAGNOSIS — E669 Obesity, unspecified: Secondary | ICD-10-CM | POA: Diagnosis not present

## 2023-01-08 DIAGNOSIS — E7800 Pure hypercholesterolemia, unspecified: Secondary | ICD-10-CM | POA: Diagnosis not present

## 2023-01-08 DIAGNOSIS — E1122 Type 2 diabetes mellitus with diabetic chronic kidney disease: Secondary | ICD-10-CM | POA: Diagnosis not present

## 2023-01-08 DIAGNOSIS — Z853 Personal history of malignant neoplasm of breast: Secondary | ICD-10-CM | POA: Diagnosis not present

## 2023-01-08 DIAGNOSIS — N183 Chronic kidney disease, stage 3 unspecified: Secondary | ICD-10-CM | POA: Diagnosis not present

## 2023-01-08 DIAGNOSIS — K219 Gastro-esophageal reflux disease without esophagitis: Secondary | ICD-10-CM | POA: Diagnosis not present

## 2023-01-27 DIAGNOSIS — E1142 Type 2 diabetes mellitus with diabetic polyneuropathy: Secondary | ICD-10-CM | POA: Diagnosis not present

## 2023-01-27 DIAGNOSIS — B351 Tinea unguium: Secondary | ICD-10-CM | POA: Diagnosis not present

## 2023-06-09 ENCOUNTER — Inpatient Hospital Stay: Payer: Medicare PPO | Attending: Internal Medicine

## 2023-06-09 DIAGNOSIS — C50312 Malignant neoplasm of lower-inner quadrant of left female breast: Secondary | ICD-10-CM | POA: Diagnosis not present

## 2023-06-09 DIAGNOSIS — Z87891 Personal history of nicotine dependence: Secondary | ICD-10-CM | POA: Diagnosis not present

## 2023-06-09 DIAGNOSIS — Z170 Estrogen receptor positive status [ER+]: Secondary | ICD-10-CM | POA: Insufficient documentation

## 2023-06-09 DIAGNOSIS — Z7982 Long term (current) use of aspirin: Secondary | ICD-10-CM | POA: Insufficient documentation

## 2023-06-09 DIAGNOSIS — N183 Chronic kidney disease, stage 3 unspecified: Secondary | ICD-10-CM | POA: Insufficient documentation

## 2023-06-09 DIAGNOSIS — D649 Anemia, unspecified: Secondary | ICD-10-CM | POA: Diagnosis not present

## 2023-06-09 DIAGNOSIS — I129 Hypertensive chronic kidney disease with stage 1 through stage 4 chronic kidney disease, or unspecified chronic kidney disease: Secondary | ICD-10-CM | POA: Diagnosis not present

## 2023-06-09 DIAGNOSIS — D472 Monoclonal gammopathy: Secondary | ICD-10-CM | POA: Insufficient documentation

## 2023-06-09 DIAGNOSIS — Z923 Personal history of irradiation: Secondary | ICD-10-CM | POA: Insufficient documentation

## 2023-06-09 DIAGNOSIS — Z79899 Other long term (current) drug therapy: Secondary | ICD-10-CM | POA: Insufficient documentation

## 2023-06-09 LAB — CBC WITH DIFFERENTIAL (CANCER CENTER ONLY)
Abs Immature Granulocytes: 0.02 10*3/uL (ref 0.00–0.07)
Basophils Absolute: 0 10*3/uL (ref 0.0–0.1)
Basophils Relative: 0 %
Eosinophils Absolute: 0 10*3/uL (ref 0.0–0.5)
Eosinophils Relative: 1 %
HCT: 34.3 % — ABNORMAL LOW (ref 36.0–46.0)
Hemoglobin: 10.7 g/dL — ABNORMAL LOW (ref 12.0–15.0)
Immature Granulocytes: 0 %
Lymphocytes Relative: 23 %
Lymphs Abs: 1.7 10*3/uL (ref 0.7–4.0)
MCH: 30.3 pg (ref 26.0–34.0)
MCHC: 31.2 g/dL (ref 30.0–36.0)
MCV: 97.2 fL (ref 80.0–100.0)
Monocytes Absolute: 0.5 10*3/uL (ref 0.1–1.0)
Monocytes Relative: 7 %
Neutro Abs: 5.1 10*3/uL (ref 1.7–7.7)
Neutrophils Relative %: 69 %
Platelet Count: 203 10*3/uL (ref 150–400)
RBC: 3.53 MIL/uL — ABNORMAL LOW (ref 3.87–5.11)
RDW: 13.8 % (ref 11.5–15.5)
WBC Count: 7.4 10*3/uL (ref 4.0–10.5)
nRBC: 0 % (ref 0.0–0.2)

## 2023-06-09 LAB — CMP (CANCER CENTER ONLY)
ALT: 13 U/L (ref 0–44)
AST: 14 U/L — ABNORMAL LOW (ref 15–41)
Albumin: 3.6 g/dL (ref 3.5–5.0)
Alkaline Phosphatase: 95 U/L (ref 38–126)
Anion gap: 6 (ref 5–15)
BUN: 27 mg/dL — ABNORMAL HIGH (ref 8–23)
CO2: 22 mmol/L (ref 22–32)
Calcium: 9 mg/dL (ref 8.9–10.3)
Chloride: 108 mmol/L (ref 98–111)
Creatinine: 1.51 mg/dL — ABNORMAL HIGH (ref 0.44–1.00)
GFR, Estimated: 33 mL/min — ABNORMAL LOW (ref >60)
Glucose, Bld: 92 mg/dL (ref 70–99)
Potassium: 4.6 mmol/L (ref 3.5–5.1)
Sodium: 136 mmol/L (ref 135–145)
Total Bilirubin: 0.3 mg/dL (ref 0.3–1.2)
Total Protein: 7.6 g/dL (ref 6.5–8.1)

## 2023-06-09 LAB — VITAMIN D 25 HYDROXY (VIT D DEFICIENCY, FRACTURES): Vit D, 25-Hydroxy: 51.44 ng/mL (ref 30–100)

## 2023-06-23 ENCOUNTER — Encounter: Payer: Self-pay | Admitting: Internal Medicine

## 2023-06-23 ENCOUNTER — Inpatient Hospital Stay: Payer: Medicare PPO | Admitting: Internal Medicine

## 2023-06-23 VITALS — BP 160/56 | HR 60 | Temp 97.7°F | Resp 18 | Wt 164.4 lb

## 2023-06-23 DIAGNOSIS — C50312 Malignant neoplasm of lower-inner quadrant of left female breast: Secondary | ICD-10-CM | POA: Diagnosis not present

## 2023-06-23 DIAGNOSIS — D649 Anemia, unspecified: Secondary | ICD-10-CM | POA: Diagnosis not present

## 2023-06-23 DIAGNOSIS — N183 Chronic kidney disease, stage 3 unspecified: Secondary | ICD-10-CM | POA: Diagnosis not present

## 2023-06-23 DIAGNOSIS — Z170 Estrogen receptor positive status [ER+]: Secondary | ICD-10-CM

## 2023-06-23 DIAGNOSIS — I129 Hypertensive chronic kidney disease with stage 1 through stage 4 chronic kidney disease, or unspecified chronic kidney disease: Secondary | ICD-10-CM | POA: Diagnosis not present

## 2023-06-23 DIAGNOSIS — Z87891 Personal history of nicotine dependence: Secondary | ICD-10-CM | POA: Diagnosis not present

## 2023-06-23 DIAGNOSIS — D472 Monoclonal gammopathy: Secondary | ICD-10-CM | POA: Diagnosis not present

## 2023-06-23 DIAGNOSIS — Z923 Personal history of irradiation: Secondary | ICD-10-CM | POA: Diagnosis not present

## 2023-06-23 NOTE — Assessment & Plan Note (Addendum)
#  Stage I ER/PR positive HER2 negative breast cancer-s/p lumpectomy followed by radiation- [facility] spring 2018]. Finished  spring 2023.    # Continue surveillance off any therapy at this time.  Mammogram due November 2023- WNL.   # Hypocalcemia: ca 8.5-  AUG 07-22-2023- 51- continue ca+ vit D BID.   #Bone density- [AUG 2023]-  BMD T-score of -0.3. continue calcium plus vitamin D- stable.    # monoclonal gammopathy of unknown significance (MGUS; sister died of MM] - AUG 07/22/2023- SPEP 0.8 g; again reviewed the extremely small risk of progression to active multiple myeloma. K/Lratio=2.0.  However continue monitor every 6 months.  Mild anemia-chronic hemoglobin around 10.7 stage III kidney disease- overall stable.    # DISPOSITION:  # mammo in NOV 2023-07-22.  # follow up in 6 months- MD; PRIOR 2 week-  labs- cbc/cmp;MM panel; K/L light chains; iron studies;ferritin; LDH- vit D- 25-OH-- Dr.B

## 2023-06-23 NOTE — Progress Notes (Signed)
[facility] CONSULT NOTE  Patient Care Team: Gauger, Hermenia Fiscal, NP as PCP - General (Internal Medicine) Ricarda Frame, MD as Consulting Physician (General Surgery) Earna Coder, MD as Consulting Physician (Oncology)  CHIEF COMPLAINTS/PURPOSE OF CONSULTATION: Breast cancer  #  Oncology History Overview Note   female with stage I left breast cancer s/p lumpectomy and sentinel lymph node biopsy on 09/17/2016.  Pathology revealed a 1.0 cm grade II invasive mammary carcinoma of no special type.  Margins were negative.  There was no tumor in 1 sentinel lymph node.  Pathologic stage was T1bN0M0.   Mammogram and ultrasound on 08/16/2016 revealed a 1.1 x 0.6 x 0.8 cm mass at the 10 o'clock position.  Left breast biopsy on 08/28/2016 revealed a 0.9 cm grade II invasive mammary carcinoma.  Tumor was ER positive (> 90%), PR negative, and Her2 2+ (negative by FISH).     She completed radiation on 11/28/2016.  She began Femara on 12/11/2016; STOPPED [facility], 2023     Bilateral diagnostic mammogram on 09/04/2020 revealed no evidence of malignancy.   Right sided mammogram and ultrasound on 02/19/2018 revealed a 0.6 x 0.7 x 0.7 cm mass at the 12:30 position 3 cm from the nipple.  Ultrasound guided biopsy right breast biopsy and clip placement on 02/27/2018 revealed a hypocellular, circumscribed hyalanized nodule with no atypia or malignancy.   Bone density study on 09/25/2016 was normal with a T score of 0.4 in the left femoral neck.  Bone density was normal on 09/28/2018 with a T score of 0.7 in the AP spine L1-L2, 0.1 in the left femoral neck, and 0.9 in the right forearm radius.   She has a monoclonal gammopathy of unknown significance (MGUS).  SPEP on 12/11/2016 revealed a 0.4 gm/dL monoclonal protein.  She has mild renal insufficiency (Cr 1.28; CrCl 45 ml/min).  Work-up on 12/18/2016 revealed an immunofixation with an IgG monoclonal protein with kappa light chain  specificity.  IgG was 2081.  Free light chain ratio was 1.28 (normal).  Beta 2 microglobulin was 2.4 (normal).  24 hour urine revealed no monoclonal protein or free light chains.  Bone survey on 01/08/2017 revealed no lytic lesions.   M-spike has been followed (gm/dL):  0.4 on 16/08/9603, 0.6 on 07/16/2017, 0.4 on 01/14/2018, 0.6 on 08/12/2018, 0.5 on 12/16/2018, 0.5 on 06/16/2019, 0.5 on 12/21/2019, and 0.7 on 06/20/2020.  IgG was 2041 on 12/18/2016, 1926 on 07/16/2017, and 1919 on 12/16/2018.     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  Accompanied by her daughter.  Erica Jackson 89 y.o.  female with breast cancer ER/PR positive HER2 negative stage I  s/p adjuvant letrozole [finished letrozole spring 2023] and MGUS is here for follow-up/an review labs.   Walking distances she feels tired in her hips and will get to feeling lightheaded. Energy is up and down.   Appetite is good. Has occ breast discomfort in left axillary area from her lumpectomy. Has completed therapy with letrozole  Other wise, patient denies any aches and pains.  No nausea vomiting.  Denies any hot flashes.  Appetite is good.  No significant weight loss.  Review of Systems  Constitutional:  Negative for chills, diaphoresis, fever, malaise/fatigue and weight loss.  HENT:  Negative for nosebleeds and  sore throat.   Eyes:  Negative for double vision.  Respiratory:  Negative for cough, hemoptysis, sputum production, shortness of breath and wheezing.   Cardiovascular:  Negative for chest pain, palpitations, orthopnea and leg swelling.  Gastrointestinal:  Negative for abdominal pain, blood in stool, constipation, diarrhea, heartburn, melena, nausea and vomiting.   Genitourinary:  Negative for dysuria, frequency and urgency.  Musculoskeletal:  Negative for back pain and joint pain.  Skin: Negative.  Negative for itching and rash.  Neurological:  Negative for dizziness, tingling, focal weakness, weakness and headaches.  Endo/Heme/Allergies:  Does not bruise/bleed easily.  Psychiatric/Behavioral:  Negative for depression. The patient is not nervous/anxious and does not have insomnia.      MEDICAL HISTORY:  Past Medical History:  Diagnosis Date   Breast cancer ([facility]) 2017   Lower inner quadrant left breast with lumpectomy and rad tx   Breast cancer, left breast ([facility]) 08/04/2016   Overview:  0.9 cm grade II invasive mammary carcinoma. Tumor was ER positive (> 90%), PR negative, and Her2 2+ (FISH negative)   Cataract    Dysrhythmia    no problems in a long time   GERD (gastroesophageal reflux disease)    Headache    Hypercholesterolemia    Hypertension    Malignant neoplasm of upper-inner quadrant of left breast in female, estrogen receptor positive ([facility])    Personal history of radiation therapy 11/2016   for left breast ca    SURGICAL HISTORY: Past Surgical History:  Procedure Laterality Date   ABDOMINAL HYSTERECTOMY     BREAST BIOPSY Left 08/28/2016   invasive mammary ca   BREAST BIOPSY Right 08/2017   HYALINIZED STROMA   BREAST BIOPSY Right 03/2018   HYPOCELLULAR HYALINIZED NODULE   BREAST EXCISIONAL BIOPSY Left 09/17/2016   lt breast ca   BREAST LUMPECTOMY Left 09/2016   invasive mammary carcinoma with clear margins and rad tx   BREAST LUMPECTOMY WITH SENTINEL LYMPH NODE BIOPSY Left 09/17/2016   Procedure: BREAST LUMPECTOMY WITH SENTINEL LYMPH NODE BX;  Surgeon: Ricarda Frame, MD;  Location: [facility] ORS;  Service: General;  Laterality: Left;   EYE SURGERY Left 2015   OOPHORECTOMY      SOCIAL HISTORY: Social History   Socioeconomic History   Marital status: Married    Spouse name: Not on file   Number of children: Not on file    Years of education: Not on file   Highest education level: Not on file  Occupational History   Not on file  Tobacco Use   Smoking status: Former    Current packs/day: 0.00    Types: Cigarettes    Quit date: 09/09/1989    Years since quitting: 33.8   Smokeless tobacco: Former    Quit date: 09/09/1989  Vaping Use   Vaping status: Not on file  Substance and Sexual Activity   Alcohol use: No   Drug use: No   Sexual activity: Not on file  Other Topics Concern   Not on file  Social History Narrative   Not on file   Social Determinants of Health   Financial Resource Strain: Not on file  Food Insecurity: Not on file  Transportation Needs: No Transportation Needs (06/19/2022)   PRAPARE - Transportation    Lack of Transportation (Medical): No    Lack of Transportation (Non-Medical): No  Physical Activity: Not on file  Stress: Not on file  Social Connections: Not on file  Intimate Partner Violence: Not on file    FAMILY  HISTORY: Family History  Problem Relation Age of Onset   Stroke Mother    Cancer Sister    Cancer Brother    Cancer Daughter    Cancer Brother    Cancer Brother    Cancer Brother    Cancer Brother    Cancer Brother    Breast cancer Neg Hx     ALLERGIES:  has No Known Allergies.  MEDICATIONS:  Current Outpatient Medications  Medication Sig Dispense Refill   acetaminophen (TYLENOL) 650 MG CR tablet Take 1 tablet by mouth as needed.     aspirin EC 81 MG tablet Take 81 mg by mouth daily.      atenolol (TENORMIN) 25 MG tablet TAKE ONE TABLET BY MOUTH ONCE DAILY     Cyanocobalamin (B-12) 1000 MCG TABS Take by mouth daily.     felodipine (PLENDIL) 5 MG 24 hr tablet Take 2.5 mg by mouth daily.     isosorbide dinitrate (ISORDIL) 20 MG tablet Take 10 mg by mouth daily.     lovastatin (MEVACOR) 10 MG tablet TAKE ONE TABLET BY MOUTH ONCE DAILY WITH DINNER     olmesartan (BENICAR) 20 MG tablet Take 20 mg by mouth daily.     omeprazole (PRILOSEC) 20 MG capsule  Take 20 mg by mouth daily.      potassium chloride SA (K-DUR,KLOR-CON) 20 MEQ tablet Take 20 mEq by mouth daily.      No current facility-administered medications for this visit.    PHYSICAL EXAMINATION: ECOG PERFORMANCE STATUS: 1 - Symptomatic but completely ambulatory  Vitals:   06/23/23 1448  BP: (!) 160/56  Pulse: 60  Resp: 18  Temp: 97.7 F (36.5 C)  SpO2: 100%    Filed Weights   06/23/23 1448  Weight: 164 lb 6.4 oz (74.6 kg)   Physical Exam Vitals and nursing note reviewed.  HENT:     Head: Normocephalic and atraumatic.     Mouth/Throat:     Pharynx: Oropharynx is clear.  Eyes:     Extraocular Movements: Extraocular movements intact.     Pupils: Pupils are equal, round, and reactive to light.  Cardiovascular:     Rate and Rhythm: Normal rate and regular rhythm.  Pulmonary:     Comments: Decreased breath sounds bilaterally.  Abdominal:     Palpations: Abdomen is soft.  Musculoskeletal:        General: Normal range of motion.     Cervical back: Normal range of motion.  Skin:    General: Skin is warm.  Neurological:     General: No focal deficit present.     Mental Status: She is alert and oriented to person, place, and time.  Psychiatric:        Behavior: Behavior normal.        Judgment: Judgment normal.     LABORATORY DATA:  I have reviewed the data as listed Lab Results  Component Value Date   WBC 7.4 06/09/2023   HGB 10.7 (L) 06/09/2023   HCT 34.3 (L) 06/09/2023   MCV 97.2 06/09/2023   PLT 203 06/09/2023   Recent Labs    12/20/22 0904 06/09/23 1438  NA 135 136  K 3.7 4.6  CL 106 108  CO2 23 22  GLUCOSE 138* 92  BUN 14 27*  CREATININE 1.05* 1.51*  CALCIUM 8.7* 9.0  GFRNONAA 51* 33*  PROT 7.8 7.6  ALBUMIN 3.6 3.6  AST 18 14*  ALT 13 13  ALKPHOS 99 95  BILITOT 0.5 0.3  RADIOGRAPHIC STUDIES: I have personally reviewed the radiological images as listed and agreed with the findings in the report. No results found.  ASSESSMENT  & PLAN:   Malignant neoplasm of lower-inner quadrant of left breast in female, estrogen receptor positive ([facility]) #Stage I ER/PR positive HER2 negative breast cancer-s/p lumpectomy followed by radiation- [facility] spring 2018]. Finished  spring 2023.    # Continue surveillance off any therapy at this time.  Mammogram due November 2023- WNL.   # Hypocalcemia: ca 8.5-  AUG 2023-07-03- 51- continue ca+ vit D BID.   #Bone density- [AUG 2023]-  BMD T-score of -0.3. continue calcium plus vitamin D- stable.    # monoclonal gammopathy of unknown significance (MGUS; sister died of MM] - AUG 2023/07/03- SPEP 0.8 g; again reviewed the extremely small risk of progression to active multiple myeloma. K/Lratio=2.0.  However continue monitor every 6 months.  Mild anemia-chronic hemoglobin around 10.7 stage III kidney disease- overall stable.    # DISPOSITION:  # mammo in NOV July 03, 2023.  # follow up in 6 months- MD; PRIOR 2 week-  labs- cbc/cmp;MM panel; K/L light chains; iron studies;ferritin; LDH- vit D- 25-OH-- Dr.B   All questions were answered. The patient knows to call the clinic with any problems, questions or concerns.    Earna Coder, MD 06/23/2023 3:26 PM

## 2023-06-23 NOTE — Progress Notes (Signed)
Walking distances she feels tired in her hips and will get to feeling lightheaded. Energy is up and down. Appetite is good. Has occ breast discomfort in left axillary area from her lumpectomy. Has completed therapy with letrozole.

## 2023-07-11 DIAGNOSIS — Z0000 Encounter for general adult medical examination without abnormal findings: Secondary | ICD-10-CM | POA: Diagnosis not present

## 2023-07-11 DIAGNOSIS — I129 Hypertensive chronic kidney disease with stage 1 through stage 4 chronic kidney disease, or unspecified chronic kidney disease: Secondary | ICD-10-CM | POA: Diagnosis not present

## 2023-07-11 DIAGNOSIS — E7800 Pure hypercholesterolemia, unspecified: Secondary | ICD-10-CM | POA: Diagnosis not present

## 2023-07-11 DIAGNOSIS — I2510 Atherosclerotic heart disease of native coronary artery without angina pectoris: Secondary | ICD-10-CM | POA: Diagnosis not present

## 2023-07-11 DIAGNOSIS — K219 Gastro-esophageal reflux disease without esophagitis: Secondary | ICD-10-CM | POA: Diagnosis not present

## 2023-07-11 DIAGNOSIS — N183 Chronic kidney disease, stage 3 unspecified: Secondary | ICD-10-CM | POA: Diagnosis not present

## 2023-07-11 DIAGNOSIS — E1122 Type 2 diabetes mellitus with diabetic chronic kidney disease: Secondary | ICD-10-CM | POA: Diagnosis not present

## 2023-07-11 DIAGNOSIS — Z79899 Other long term (current) drug therapy: Secondary | ICD-10-CM | POA: Diagnosis not present

## 2023-07-11 DIAGNOSIS — Z1331 Encounter for screening for depression: Secondary | ICD-10-CM | POA: Diagnosis not present

## 2023-08-11 DIAGNOSIS — E1142 Type 2 diabetes mellitus with diabetic polyneuropathy: Secondary | ICD-10-CM | POA: Diagnosis not present

## 2023-08-11 DIAGNOSIS — B351 Tinea unguium: Secondary | ICD-10-CM | POA: Diagnosis not present

## 2023-08-13 DIAGNOSIS — N1832 Chronic kidney disease, stage 3b: Secondary | ICD-10-CM | POA: Diagnosis not present

## 2023-09-09 ENCOUNTER — Ambulatory Visit
Admission: RE | Admit: 2023-09-09 | Discharge: 2023-09-09 | Disposition: A | Discharge status: Home / Self Care | Payer: Medicare PPO | Source: Ambulatory Visit | Attending: Internal Medicine | Admitting: Internal Medicine

## 2023-09-09 DIAGNOSIS — C50312 Malignant neoplasm of lower-inner quadrant of left female breast: Secondary | ICD-10-CM | POA: Insufficient documentation

## 2023-09-09 DIAGNOSIS — Z853 Personal history of malignant neoplasm of breast: Secondary | ICD-10-CM | POA: Diagnosis not present

## 2023-09-09 DIAGNOSIS — Z1231 Encounter for screening mammogram for malignant neoplasm of breast: Secondary | ICD-10-CM | POA: Diagnosis not present

## 2023-09-09 DIAGNOSIS — Z170 Estrogen receptor positive status [ER+]: Secondary | ICD-10-CM | POA: Diagnosis not present

## 2023-09-30 DIAGNOSIS — L02212 Cutaneous abscess of back [any part, except buttock]: Secondary | ICD-10-CM | POA: Diagnosis not present

## 2023-12-10 ENCOUNTER — Other Ambulatory Visit: Payer: Self-pay

## 2023-12-10 DIAGNOSIS — C50312 Malignant neoplasm of lower-inner quadrant of left female breast: Secondary | ICD-10-CM

## 2023-12-11 ENCOUNTER — Inpatient Hospital Stay: Payer: Medicare PPO | Attending: Internal Medicine

## 2023-12-11 DIAGNOSIS — C50312 Malignant neoplasm of lower-inner quadrant of left female breast: Secondary | ICD-10-CM | POA: Diagnosis not present

## 2023-12-11 DIAGNOSIS — Z79899 Other long term (current) drug therapy: Secondary | ICD-10-CM | POA: Insufficient documentation

## 2023-12-11 DIAGNOSIS — D472 Monoclonal gammopathy: Secondary | ICD-10-CM | POA: Diagnosis not present

## 2023-12-11 DIAGNOSIS — Z853 Personal history of malignant neoplasm of breast: Secondary | ICD-10-CM | POA: Diagnosis not present

## 2023-12-11 DIAGNOSIS — Z923 Personal history of irradiation: Secondary | ICD-10-CM | POA: Insufficient documentation

## 2023-12-11 DIAGNOSIS — Z7982 Long term (current) use of aspirin: Secondary | ICD-10-CM | POA: Diagnosis not present

## 2023-12-11 LAB — CBC WITH DIFFERENTIAL (CANCER CENTER ONLY)
Abs Immature Granulocytes: 0.03 10*3/uL (ref 0.00–0.07)
Basophils Absolute: 0 10*3/uL (ref 0.0–0.1)
Basophils Relative: 0 %
Eosinophils Absolute: 0 10*3/uL (ref 0.0–0.5)
Eosinophils Relative: 0 %
HCT: 31.8 % — ABNORMAL LOW (ref 36.0–46.0)
Hemoglobin: 10.1 g/dL — ABNORMAL LOW (ref 12.0–15.0)
Immature Granulocytes: 0 %
Lymphocytes Relative: 19 %
Lymphs Abs: 1.4 10*3/uL (ref 0.7–4.0)
MCH: 30.9 pg (ref 26.0–34.0)
MCHC: 31.8 g/dL (ref 30.0–36.0)
MCV: 97.2 fL (ref 80.0–100.0)
Monocytes Absolute: 0.4 10*3/uL (ref 0.1–1.0)
Monocytes Relative: 6 %
Neutro Abs: 5.2 10*3/uL (ref 1.7–7.7)
Neutrophils Relative %: 75 %
Platelet Count: 220 10*3/uL (ref 150–400)
RBC: 3.27 MIL/uL — ABNORMAL LOW (ref 3.87–5.11)
RDW: 13.6 % (ref 11.5–15.5)
WBC Count: 7.1 10*3/uL (ref 4.0–10.5)
nRBC: 0 % (ref 0.0–0.2)

## 2023-12-11 LAB — CMP (CANCER CENTER ONLY)
ALT: 12 U/L (ref 0–44)
AST: 13 U/L — ABNORMAL LOW (ref 15–41)
Albumin: 3.4 g/dL — ABNORMAL LOW (ref 3.5–5.0)
Alkaline Phosphatase: 84 U/L (ref 38–126)
Anion gap: 7 (ref 5–15)
BUN: 26 mg/dL — ABNORMAL HIGH (ref 8–23)
CO2: 22 mmol/L (ref 22–32)
Calcium: 9 mg/dL (ref 8.9–10.3)
Chloride: 110 mmol/L (ref 98–111)
Creatinine: 1.76 mg/dL — ABNORMAL HIGH (ref 0.44–1.00)
GFR, Estimated: 28 mL/min — ABNORMAL LOW (ref >60)
Glucose, Bld: 106 mg/dL — ABNORMAL HIGH (ref 70–99)
Potassium: 4.1 mmol/L (ref 3.5–5.1)
Sodium: 139 mmol/L (ref 135–145)
Total Bilirubin: 0.4 mg/dL (ref 0.0–1.2)
Total Protein: 7.3 g/dL (ref 6.5–8.1)

## 2023-12-11 LAB — LACTATE DEHYDROGENASE: LDH: 102 U/L (ref 98–192)

## 2023-12-11 LAB — VITAMIN D 25 HYDROXY (VIT D DEFICIENCY, FRACTURES): Vit D, 25-Hydroxy: 44.14 ng/mL (ref 30–100)

## 2023-12-11 LAB — IRON AND TIBC
Iron: 50 ug/dL (ref 28–170)
Saturation Ratios: 15 % (ref 10.4–31.8)
TIBC: 342 ug/dL (ref 250–450)
UIBC: 292 ug/dL

## 2023-12-11 LAB — FERRITIN: Ferritin: 66 ng/mL (ref 11–307)

## 2023-12-15 LAB — KAPPA/LAMBDA LIGHT CHAINS
Kappa free light chain: 90.5 mg/L — ABNORMAL HIGH (ref 3.3–19.4)
Kappa, lambda light chain ratio: 2.88 — ABNORMAL HIGH (ref 0.26–1.65)
Lambda free light chains: 31.4 mg/L — ABNORMAL HIGH (ref 5.7–26.3)

## 2023-12-16 LAB — MULTIPLE MYELOMA PANEL, SERUM
Albumin SerPl Elph-Mcnc: 3.3 g/dL (ref 2.9–4.4)
Albumin/Glob SerPl: 1 (ref 0.7–1.7)
Alpha 1: 0.2 g/dL (ref 0.0–0.4)
Alpha2 Glob SerPl Elph-Mcnc: 0.7 g/dL (ref 0.4–1.0)
B-Globulin SerPl Elph-Mcnc: 0.9 g/dL (ref 0.7–1.3)
Gamma Glob SerPl Elph-Mcnc: 1.7 g/dL (ref 0.4–1.8)
Globulin, Total: 3.4 g/dL (ref 2.2–3.9)
IgA: 163 mg/dL (ref 64–422)
IgG (Immunoglobin G), Serum: 2189 mg/dL — ABNORMAL HIGH (ref 586–1602)
IgM (Immunoglobulin M), Srm: 40 mg/dL (ref 26–217)
M Protein SerPl Elph-Mcnc: 0.8 g/dL — ABNORMAL HIGH
Total Protein ELP: 6.7 g/dL (ref 6.0–8.5)

## 2023-12-26 ENCOUNTER — Encounter: Payer: Self-pay | Admitting: Internal Medicine

## 2023-12-26 ENCOUNTER — Inpatient Hospital Stay: Payer: Medicare PPO | Admitting: Internal Medicine

## 2023-12-26 VITALS — BP 158/70 | HR 60 | Temp 97.2°F | Resp 16 | Ht 64.0 in | Wt 157.0 lb

## 2023-12-26 DIAGNOSIS — C50312 Malignant neoplasm of lower-inner quadrant of left female breast: Secondary | ICD-10-CM

## 2023-12-26 DIAGNOSIS — Z170 Estrogen receptor positive status [ER+]: Secondary | ICD-10-CM

## 2023-12-26 DIAGNOSIS — Z923 Personal history of irradiation: Secondary | ICD-10-CM | POA: Diagnosis not present

## 2023-12-26 DIAGNOSIS — Z7982 Long term (current) use of aspirin: Secondary | ICD-10-CM | POA: Diagnosis not present

## 2023-12-26 DIAGNOSIS — Z853 Personal history of malignant neoplasm of breast: Secondary | ICD-10-CM | POA: Diagnosis not present

## 2023-12-26 DIAGNOSIS — D472 Monoclonal gammopathy: Secondary | ICD-10-CM | POA: Diagnosis not present

## 2023-12-26 DIAGNOSIS — Z79899 Other long term (current) drug therapy: Secondary | ICD-10-CM | POA: Diagnosis not present

## 2023-12-26 NOTE — Patient Instructions (Signed)
#  Recommend gentle iron [iron biglycinate; 28 mg ] 1 pill a day.  This pill is unlikely to cause stomach upset or cause constipation.

## 2023-12-26 NOTE — Assessment & Plan Note (Addendum)
#  Stage I ER/PR positive HER2 negative breast cancer-s/p lumpectomy followed by radiation- [facility] spring 2018]. Finished  spring 2023.    # Continue surveillance off any therapy at this time.  Mammogram  November 2024- WNL.   # Hypocalcemia: ca 9.1 -  AUG January 11, 2023- 51- continue ca+ vit D BID.   #Bone density- [AUG 2023]-  BMD T-score of -0.3. continue calcium plus vitamin D- stable.    # monoclonal gammopathy of unknown significance (MGUS; sister died of MM] - FEB  01-11-24- SPEP 0.8 g; again reviewed the extremely small risk of progression to active multiple myeloma. K/Lratio=2.0.  However continue monitor every 6 months.    # Anemia: Mild anemia-chronic hemoglobin around 10.7 stage III kidney disease- worsening- iron sat- 15%; ferritin-66- #Recommend gentle iron [iron biglycinate; 28 mg ] 1 pill a day.  This pill is unlikely to cause stomach upset or cause constipation.   # CKD stage III-IV -defer to PCP   # DISPOSITION:   # follow up in 6 months- MD; PRIOR 2 week-  labs- cbc/cmp;MM panel; K/L light chains; iron studies;ferritin; LDH- vit D- 25-OH-- Dr.B

## 2023-12-26 NOTE — Progress Notes (Signed)
 Patient has no concerns today.

## 2023-12-29 NOTE — Progress Notes (Signed)
 [facility] CONSULT NOTE  Patient Care Team: Gauger, Hermenia Fiscal, NP as PCP - General (Internal Medicine) Ricarda Frame, MD as Consulting Physician (General Surgery) Earna Coder, MD as Consulting Physician (Oncology)  CHIEF COMPLAINTS/PURPOSE OF CONSULTATION: Breast cancer/ MGUS.   #  Oncology History Overview Note   female with stage I left breast cancer s/p lumpectomy and sentinel lymph node biopsy on 09/17/2016.  Pathology revealed a 1.0 cm grade II invasive mammary carcinoma of no special type.  Margins were negative.  There was no tumor in 1 sentinel lymph node.  Pathologic stage was T1bN0M0.   Mammogram and ultrasound on 08/16/2016 revealed a 1.1 x 0.6 x 0.8 cm mass at the 10 o'clock position.  Left breast biopsy on 08/28/2016 revealed a 0.9 cm grade II invasive mammary carcinoma.  Tumor was ER positive (> 90%), PR negative, and Her2 2+ (negative by FISH).     She completed radiation on 11/28/2016.  She began Femara on 12/11/2016; STOPPED FEMERA SPRING, 2023     Bilateral diagnostic mammogram on 09/04/2020 revealed no evidence of malignancy.   Right sided mammogram and ultrasound on 02/19/2018 revealed a 0.6 x 0.7 x 0.7 cm mass at the 12:30 position 3 cm from the nipple.  Ultrasound guided biopsy right breast biopsy and clip placement on 02/27/2018 revealed a hypocellular, circumscribed hyalanized nodule with no atypia or malignancy.   Bone density study on 09/25/2016 was normal with a T score of 0.4 in the left femoral neck.  Bone density was normal on 09/28/2018 with a T score of 0.7 in the AP spine L1-L2, 0.1 in the left femoral neck, and 0.9 in the right forearm radius.   She has a monoclonal gammopathy of unknown significance (MGUS).  SPEP on 12/11/2016 revealed a 0.4 gm/dL monoclonal protein.  She has mild renal insufficiency (Cr 1.28; CrCl 45 ml/min).  Work-up on 12/18/2016 revealed an immunofixation with an IgG monoclonal protein with kappa light  chain specificity.  IgG was 2081.  Free light chain ratio was 1.28 (normal).  Beta 2 microglobulin was 2.4 (normal).  24 hour urine revealed no monoclonal protein or free light chains.  Bone survey on 01/08/2017 revealed no lytic lesions.   M-spike has been followed (gm/dL):  0.4 on 66/44/0347, 0.6 on 07/16/2017, 0.4 on 01/14/2018, 0.6 on 08/12/2018, 0.5 on 12/16/2018, 0.5 on 06/16/2019, 0.5 on 12/21/2019, and 0.7 on 06/20/2020.  IgG was 2041 on 12/18/2016, 1926 on 07/16/2017, and 1919 on 12/16/2018.     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  Accompanied by her daughter.  Erica Jackson 89 y.o.  female with breast cancer ER/PR positive HER2 negative stage I  s/p adjuvant letrozole [finished letrozole spring 2023] currently on surveillance and MGUS is here for follow-up/and review labs/mammogram.   Appetite is good. Has occ breast discomfort in left axillary area from her lumpectomy. Other wise, patient denies any aches and pains.  No nausea vomiting.  Denies any hot flashes.  Appetite is good.  No significant weight loss.  Review of Systems  Constitutional:  Negative for chills, diaphoresis, fever, malaise/fatigue and weight loss.  HENT:  Negative for nosebleeds and sore throat.   Eyes:  Negative for double vision.  Respiratory:  Negative for cough, hemoptysis, sputum production, shortness of breath  and wheezing.   Cardiovascular:  Negative for chest pain, palpitations, orthopnea and leg swelling.  Gastrointestinal:  Negative for abdominal pain, blood in stool, constipation, diarrhea, heartburn, melena, nausea and vomiting.  Genitourinary:  Negative for dysuria, frequency and urgency.  Musculoskeletal:  Negative for back pain and joint  pain.  Skin: Negative.  Negative for itching and rash.  Neurological:  Negative for dizziness, tingling, focal weakness, weakness and headaches.  Endo/Heme/Allergies:  Does not bruise/bleed easily.  Psychiatric/Behavioral:  Negative for depression. The patient is not nervous/anxious and does not have insomnia.      MEDICAL HISTORY:  Past Medical History:  Diagnosis Date   Breast cancer (HCC) 2017   Lower inner quadrant left breast with lumpectomy and rad tx   Breast cancer, left breast (HCC) 08/04/2016   Overview:  0.9 cm grade II invasive mammary carcinoma. Tumor was ER positive (> 90%), PR negative, and Her2 2+ (FISH negative)   Cataract    Dysrhythmia    no problems in a long time   GERD (gastroesophageal reflux disease)    Headache    Hypercholesterolemia    Hypertension    Malignant neoplasm of upper-inner quadrant of left breast in female, estrogen receptor positive (HCC)    Personal history of radiation therapy 11/2016   for left breast ca    SURGICAL HISTORY: Past Surgical History:  Procedure Laterality Date   ABDOMINAL HYSTERECTOMY     BREAST BIOPSY Left 08/28/2016   invasive mammary ca   BREAST BIOPSY Right 08/2017   HYALINIZED STROMA   BREAST BIOPSY Right 03/2018   HYPOCELLULAR HYALINIZED NODULE   BREAST EXCISIONAL BIOPSY Left 09/17/2016   lt breast ca   BREAST LUMPECTOMY Left 09/2016   invasive mammary carcinoma with clear margins and rad tx   BREAST LUMPECTOMY WITH SENTINEL LYMPH NODE BIOPSY Left 09/17/2016   Procedure: BREAST LUMPECTOMY WITH SENTINEL LYMPH NODE BX;  Surgeon: Ricarda Frame, MD;  Location: [facility] ORS;  Service: General;  Laterality: Left;   EYE SURGERY Left 2015   OOPHORECTOMY      SOCIAL HISTORY: Social History   Socioeconomic History   Marital status: Married    Spouse name: Not on file   Number of children: Not on file   Years of education: Not on file   Highest education level: Not on file  Occupational History   Not on file   Tobacco Use   Smoking status: Former    Current packs/day: 0.00    Types: Cigarettes    Quit date: 09/09/1989    Years since quitting: 34.3   Smokeless tobacco: Former    Quit date: 09/09/1989  Vaping Use   Vaping status: Not on file  Substance and Sexual Activity   Alcohol use: No   Drug use: No   Sexual activity: Not on file  Other Topics Concern   Not on file  Social History Narrative   Not on file   Social Drivers of Health   Financial Resource Strain: Low Risk  (07/11/2023)   Received from [facility]   Overall Financial Resource Strain (CARDIA)    Difficulty of Paying Living Expenses: Not hard at all  Food Insecurity: No Food Insecurity (07/11/2023)   Received from [facility]   Hunger Vital Sign    Worried About Running Out of Food in the Last Year: Never true    Ran Out of Food in the Last Year: Never true  Transportation Needs: No Transportation Needs (07/11/2023)  Received from [facility]   PRAPARE - Transportation    Lack of Transportation (Non-Medical): No    In the past 12 months, has lack of transportation kept you from medical appointments or from getting medications?: No  Physical Activity: Not on file  Stress: Not on file  Social Connections: Not on file  Intimate Partner Violence: Not on file    FAMILY HISTORY: Family History  Problem Relation Age of Onset   Stroke Mother    Cancer Sister    Cancer Brother    Cancer Daughter    Cancer Brother    Cancer Brother    Cancer Brother    Cancer Brother    Cancer Brother    Breast cancer Neg Hx     ALLERGIES:  has no known allergies.  MEDICATIONS:  Current Outpatient Medications  Medication Sig Dispense Refill   acetaminophen (TYLENOL) 650 MG CR tablet Take 1 tablet by mouth as needed.     aspirin EC 81 MG tablet Take 81 mg by mouth daily.      atenolol (TENORMIN) 25 MG tablet TAKE ONE TABLET BY MOUTH ONCE DAILY     Cyanocobalamin (B-12) 1000  MCG TABS Take by mouth daily.     felodipine (PLENDIL) 5 MG 24 hr tablet Take 2.5 mg by mouth daily.     isosorbide dinitrate (ISORDIL) 20 MG tablet Take 10 mg by mouth daily.     lovastatin (MEVACOR) 10 MG tablet TAKE ONE TABLET BY MOUTH ONCE DAILY WITH DINNER     olmesartan (BENICAR) 20 MG tablet Take 20 mg by mouth daily.     omeprazole (PRILOSEC) 20 MG capsule Take 20 mg by mouth daily.      potassium chloride SA (K-DUR,KLOR-CON) 20 MEQ tablet Take 20 mEq by mouth daily.      No current facility-administered medications for this visit.    PHYSICAL EXAMINATION: ECOG PERFORMANCE STATUS: 1 - Symptomatic but completely ambulatory  Vitals:   12/26/23 1457  BP: (!) 158/70  Pulse: 60  Resp: 16  Temp: (!) 97.2 F (36.2 C)  SpO2: 100%    Filed Weights   12/26/23 1457  Weight: 157 lb (71.2 kg)   Physical Exam Vitals and nursing note reviewed.  HENT:     Head: Normocephalic and atraumatic.     Mouth/Throat:     Pharynx: Oropharynx is clear.  Eyes:     Extraocular Movements: Extraocular movements intact.     Pupils: Pupils are equal, round, and reactive to light.  Cardiovascular:     Rate and Rhythm: Normal rate and regular rhythm.  Pulmonary:     Comments: Decreased breath sounds bilaterally.  Abdominal:     Palpations: Abdomen is soft.  Musculoskeletal:        General: Normal range of motion.     Cervical back: Normal range of motion.  Skin:    General: Skin is warm.  Neurological:     General: No focal deficit present.     Mental Status: She is alert and oriented to person, place, and time.  Psychiatric:        Behavior: Behavior normal.        Judgment: Judgment normal.     LABORATORY DATA:  I have reviewed the data as listed Lab Results  Component Value Date   WBC 7.1 12/11/2023   HGB 10.1 (L) 12/11/2023   HCT 31.8 (L) 12/11/2023   MCV 97.2 12/11/2023   PLT 220 12/11/2023   Recent  Labs    06/09/23 1438 12/11/23 1201  NA 136 139  K 4.6 4.1  CL 108  110  CO2 22 22  GLUCOSE 92 106*  BUN 27* 26*  CREATININE 1.51* 1.76*  CALCIUM 9.0 9.0  GFRNONAA 33* 28*  PROT 7.6 7.3  ALBUMIN 3.6 3.4*  AST 14* 13*  ALT 13 12  ALKPHOS 95 84  BILITOT 0.3 0.4    RADIOGRAPHIC STUDIES: I have personally reviewed the radiological images as listed and agreed with the findings in the report. No results found.  ASSESSMENT & PLAN:   Malignant neoplasm of lower-inner quadrant of left breast in female, estrogen receptor positive (HCC) #Stage I ER/PR positive HER2 negative breast cancer-s/p lumpectomy followed by radiation- [facility] spring 2018]. Finished  spring 2023.    # Continue surveillance off any therapy at this time.  Mammogram  November 2024- WNL.   # Hypocalcemia: ca 9.1 -  AUG 01/21/23- 51- continue ca+ vit D BID.   #Bone density- [AUG 2023]-  BMD T-score of -0.3. continue calcium plus vitamin D- stable.    # monoclonal gammopathy of unknown significance (MGUS; sister died of MM] - FEB  01/21/2024- SPEP 0.8 g; again reviewed the extremely small risk of progression to active multiple myeloma. K/Lratio=2.0.  However continue monitor every 6 months.    # Anemia: Mild anemia-chronic hemoglobin around 10.7 stage III kidney disease- worsening- iron sat- 15%; ferritin-66- #Recommend gentle iron [iron biglycinate; 28 mg ] 1 pill a day.  This pill is unlikely to cause stomach upset or cause constipation.   # CKD stage III-IV -defer to PCP   # DISPOSITION:   # follow up in 6 months- MD; PRIOR 2 week-  labs- cbc/cmp;MM panel; K/L light chains; iron studies;ferritin; LDH- vit D- 25-OH-- Dr.B   All questions were answered. The patient knows to call the clinic with any problems, questions or concerns.    Earna Coder, MD 12/29/2023 11:53 AM

## 2024-01-08 DIAGNOSIS — N1831 Chronic kidney disease, stage 3a: Secondary | ICD-10-CM | POA: Diagnosis not present

## 2024-01-08 DIAGNOSIS — I129 Hypertensive chronic kidney disease with stage 1 through stage 4 chronic kidney disease, or unspecified chronic kidney disease: Secondary | ICD-10-CM | POA: Diagnosis not present

## 2024-01-08 DIAGNOSIS — E1122 Type 2 diabetes mellitus with diabetic chronic kidney disease: Secondary | ICD-10-CM | POA: Diagnosis not present

## 2024-01-08 DIAGNOSIS — Z87891 Personal history of nicotine dependence: Secondary | ICD-10-CM | POA: Diagnosis not present

## 2024-01-08 DIAGNOSIS — I2510 Atherosclerotic heart disease of native coronary artery without angina pectoris: Secondary | ICD-10-CM | POA: Diagnosis not present

## 2024-01-08 DIAGNOSIS — D509 Iron deficiency anemia, unspecified: Secondary | ICD-10-CM | POA: Diagnosis not present

## 2024-01-08 DIAGNOSIS — K219 Gastro-esophageal reflux disease without esophagitis: Secondary | ICD-10-CM | POA: Diagnosis not present

## 2024-01-08 DIAGNOSIS — E7800 Pure hypercholesterolemia, unspecified: Secondary | ICD-10-CM | POA: Diagnosis not present

## 2024-02-16 DIAGNOSIS — E1142 Type 2 diabetes mellitus with diabetic polyneuropathy: Secondary | ICD-10-CM | POA: Diagnosis not present

## 2024-02-16 DIAGNOSIS — B351 Tinea unguium: Secondary | ICD-10-CM | POA: Diagnosis not present

## 2024-04-29 DIAGNOSIS — I2510 Atherosclerotic heart disease of native coronary artery without angina pectoris: Secondary | ICD-10-CM | POA: Diagnosis not present

## 2024-04-29 DIAGNOSIS — E1169 Type 2 diabetes mellitus with other specified complication: Secondary | ICD-10-CM | POA: Diagnosis not present

## 2024-04-29 DIAGNOSIS — I10 Essential (primary) hypertension: Secondary | ICD-10-CM | POA: Diagnosis not present

## 2024-04-29 DIAGNOSIS — E7800 Pure hypercholesterolemia, unspecified: Secondary | ICD-10-CM | POA: Diagnosis not present

## 2024-04-29 DIAGNOSIS — N1831 Chronic kidney disease, stage 3a: Secondary | ICD-10-CM | POA: Diagnosis not present

## 2024-05-11 DIAGNOSIS — N183 Chronic kidney disease, stage 3 unspecified: Secondary | ICD-10-CM | POA: Diagnosis not present

## 2024-05-11 DIAGNOSIS — R42 Dizziness and giddiness: Secondary | ICD-10-CM | POA: Diagnosis not present

## 2024-05-13 DIAGNOSIS — I951 Orthostatic hypotension: Secondary | ICD-10-CM | POA: Diagnosis not present

## 2024-05-13 DIAGNOSIS — R42 Dizziness and giddiness: Secondary | ICD-10-CM | POA: Diagnosis not present

## 2024-06-11 ENCOUNTER — Inpatient Hospital Stay: Payer: Medicare PPO | Attending: Internal Medicine

## 2024-06-11 ENCOUNTER — Other Ambulatory Visit: Payer: Self-pay

## 2024-06-11 DIAGNOSIS — Z79899 Other long term (current) drug therapy: Secondary | ICD-10-CM | POA: Insufficient documentation

## 2024-06-11 DIAGNOSIS — D631 Anemia in chronic kidney disease: Secondary | ICD-10-CM | POA: Diagnosis not present

## 2024-06-11 DIAGNOSIS — N183 Chronic kidney disease, stage 3 unspecified: Secondary | ICD-10-CM | POA: Diagnosis not present

## 2024-06-11 DIAGNOSIS — Z87891 Personal history of nicotine dependence: Secondary | ICD-10-CM | POA: Insufficient documentation

## 2024-06-11 DIAGNOSIS — Z853 Personal history of malignant neoplasm of breast: Secondary | ICD-10-CM | POA: Insufficient documentation

## 2024-06-11 DIAGNOSIS — D472 Monoclonal gammopathy: Secondary | ICD-10-CM | POA: Diagnosis not present

## 2024-06-11 DIAGNOSIS — Z7982 Long term (current) use of aspirin: Secondary | ICD-10-CM | POA: Diagnosis not present

## 2024-06-11 DIAGNOSIS — C50312 Malignant neoplasm of lower-inner quadrant of left female breast: Secondary | ICD-10-CM

## 2024-06-11 LAB — CBC WITH DIFFERENTIAL (CANCER CENTER ONLY)
Abs Immature Granulocytes: 0.01 K/uL (ref 0.00–0.07)
Basophils Absolute: 0 K/uL (ref 0.0–0.1)
Basophils Relative: 1 %
Eosinophils Absolute: 0 K/uL (ref 0.0–0.5)
Eosinophils Relative: 1 %
HCT: 31.3 % — ABNORMAL LOW (ref 36.0–46.0)
Hemoglobin: 10 g/dL — ABNORMAL LOW (ref 12.0–15.0)
Immature Granulocytes: 0 %
Lymphocytes Relative: 25 %
Lymphs Abs: 1.6 K/uL (ref 0.7–4.0)
MCH: 31.3 pg (ref 26.0–34.0)
MCHC: 31.9 g/dL (ref 30.0–36.0)
MCV: 98.1 fL (ref 80.0–100.0)
Monocytes Absolute: 0.4 K/uL (ref 0.1–1.0)
Monocytes Relative: 7 %
Neutro Abs: 4.3 K/uL (ref 1.7–7.7)
Neutrophils Relative %: 66 %
Platelet Count: 221 K/uL (ref 150–400)
RBC: 3.19 MIL/uL — ABNORMAL LOW (ref 3.87–5.11)
RDW: 13.9 % (ref 11.5–15.5)
WBC Count: 6.3 K/uL (ref 4.0–10.5)
nRBC: 0 % (ref 0.0–0.2)

## 2024-06-11 LAB — CMP (CANCER CENTER ONLY)
ALT: 14 U/L (ref 0–44)
AST: 16 U/L (ref 15–41)
Albumin: 3.4 g/dL — ABNORMAL LOW (ref 3.5–5.0)
Alkaline Phosphatase: 95 U/L (ref 38–126)
Anion gap: 7 (ref 5–15)
BUN: 17 mg/dL (ref 8–23)
CO2: 23 mmol/L (ref 22–32)
Calcium: 9 mg/dL (ref 8.9–10.3)
Chloride: 108 mmol/L (ref 98–111)
Creatinine: 1.47 mg/dL — ABNORMAL HIGH (ref 0.44–1.00)
GFR, Estimated: 34 mL/min — ABNORMAL LOW (ref >60)
Glucose, Bld: 97 mg/dL (ref 70–99)
Potassium: 3.7 mmol/L (ref 3.5–5.1)
Sodium: 138 mmol/L (ref 135–145)
Total Bilirubin: 0.2 mg/dL (ref 0.0–1.2)
Total Protein: 7.5 g/dL (ref 6.5–8.1)

## 2024-06-11 LAB — IRON AND TIBC
Iron: 36 ug/dL (ref 28–170)
Saturation Ratios: 12 % (ref 10.4–31.8)
TIBC: 290 ug/dL (ref 250–450)
UIBC: 254 ug/dL

## 2024-06-11 LAB — VITAMIN D 25 HYDROXY (VIT D DEFICIENCY, FRACTURES): Vit D, 25-Hydroxy: 56.71 ng/mL (ref 30–100)

## 2024-06-11 LAB — FERRITIN: Ferritin: 112 ng/mL (ref 11–307)

## 2024-06-11 LAB — LACTATE DEHYDROGENASE: LDH: 123 U/L (ref 98–192)

## 2024-06-14 LAB — MULTIPLE MYELOMA PANEL, SERUM
Albumin SerPl Elph-Mcnc: 3.2 g/dL (ref 2.9–4.4)
Albumin/Glob SerPl: 0.9 (ref 0.7–1.7)
Alpha 1: 0.2 g/dL (ref 0.0–0.4)
Alpha2 Glob SerPl Elph-Mcnc: 0.7 g/dL (ref 0.4–1.0)
B-Globulin SerPl Elph-Mcnc: 0.9 g/dL (ref 0.7–1.3)
Gamma Glob SerPl Elph-Mcnc: 1.8 g/dL (ref 0.4–1.8)
Globulin, Total: 3.7 g/dL (ref 2.2–3.9)
IgA: 151 mg/dL (ref 64–422)
IgG (Immunoglobin G), Serum: 2177 mg/dL — ABNORMAL HIGH (ref 586–1602)
IgM (Immunoglobulin M), Srm: 31 mg/dL (ref 26–217)
M Protein SerPl Elph-Mcnc: 0.8 g/dL — ABNORMAL HIGH
Total Protein ELP: 6.9 g/dL (ref 6.0–8.5)

## 2024-06-14 LAB — KAPPA/LAMBDA LIGHT CHAINS
Kappa free light chain: 61.1 mg/L — ABNORMAL HIGH (ref 3.3–19.4)
Kappa, lambda light chain ratio: 2.12 — ABNORMAL HIGH (ref 0.26–1.65)
Lambda free light chains: 28.8 mg/L — ABNORMAL HIGH (ref 5.7–26.3)

## 2024-06-25 ENCOUNTER — Inpatient Hospital Stay: Payer: Medicare PPO | Admitting: Internal Medicine

## 2024-06-25 ENCOUNTER — Encounter: Payer: Self-pay | Admitting: Internal Medicine

## 2024-06-25 VITALS — BP 170/80 | HR 81 | Temp 97.7°F | Resp 16 | Ht 64.0 in | Wt 158.0 lb

## 2024-06-25 DIAGNOSIS — Z853 Personal history of malignant neoplasm of breast: Secondary | ICD-10-CM | POA: Diagnosis not present

## 2024-06-25 DIAGNOSIS — Z170 Estrogen receptor positive status [ER+]: Secondary | ICD-10-CM | POA: Diagnosis not present

## 2024-06-25 DIAGNOSIS — D631 Anemia in chronic kidney disease: Secondary | ICD-10-CM | POA: Diagnosis not present

## 2024-06-25 DIAGNOSIS — C50312 Malignant neoplasm of lower-inner quadrant of left female breast: Secondary | ICD-10-CM | POA: Diagnosis not present

## 2024-06-25 DIAGNOSIS — D649 Anemia, unspecified: Secondary | ICD-10-CM

## 2024-06-25 DIAGNOSIS — Z7982 Long term (current) use of aspirin: Secondary | ICD-10-CM | POA: Diagnosis not present

## 2024-06-25 DIAGNOSIS — D472 Monoclonal gammopathy: Secondary | ICD-10-CM | POA: Diagnosis not present

## 2024-06-25 DIAGNOSIS — Z79899 Other long term (current) drug therapy: Secondary | ICD-10-CM | POA: Diagnosis not present

## 2024-06-25 DIAGNOSIS — N183 Chronic kidney disease, stage 3 unspecified: Secondary | ICD-10-CM | POA: Diagnosis not present

## 2024-06-25 DIAGNOSIS — Z87891 Personal history of nicotine dependence: Secondary | ICD-10-CM | POA: Diagnosis not present

## 2024-06-25 NOTE — Assessment & Plan Note (Deleted)
#  Stage I ER/PR positive HER2 negative breast cancer-s/p lumpectomy followed by radiation- [facility] spring 2018]. Finished  spring 2023.    # Continue surveillance off any therapy at this time.  Mammogram  November 2024- WNL.   # Hypocalcemia: ca 9.1 -  AUG January 11, 2023- 51- continue ca+ vit D BID.   #Bone density- [AUG 2023]-  BMD T-score of -0.3. continue calcium plus vitamin D- stable.    # monoclonal gammopathy of unknown significance (MGUS; sister died of MM] - FEB  01-11-24- SPEP 0.8 g; again reviewed the extremely small risk of progression to active multiple myeloma. K/Lratio=2.0.  However continue monitor every 6 months.    # Anemia: Mild anemia-chronic hemoglobin around 10.7 stage III kidney disease- worsening- iron sat- 15%; ferritin-66- #Recommend gentle iron [iron biglycinate; 28 mg ] 1 pill a day.  This pill is unlikely to cause stomach upset or cause constipation.   # CKD stage III-IV -defer to PCP   # DISPOSITION:   # follow up in 6 months- MD; PRIOR 2 week-  labs- cbc/cmp;MM panel; K/L light chains; iron studies;ferritin; LDH- vit D- 25-OH-- Dr.B

## 2024-06-25 NOTE — Assessment & Plan Note (Addendum)
#   Anemia: Mild anemia-chronic hemoglobin around 10.0  sec to stage III kidney disease overall stable Continue gentle iron [iron biglycinate-consider Venofer next week.  #  monoclonal gammopathy of unknown significance (MGUS; sister died of MM] - Again reviewed the extremely small risk of progression to active multiple myeloma. K/Lratio=2.0.  However continue monitor every 6 months.    # Stage I ER/PR positive HER2 negative breast cancer-s/p lumpectomy followed by radiation- [facility] spring 2018]. Finished AI  spring 2023. Continue surveillance off any therapy at this time.  Mammogram  November 2024- WNL.  Ordered mammogram  #Bone density- [AUG 2023]-  BMD T-score of -0.3. continue calcium plus vitamin D - stable; will reeapt in 2026   # CKD stage III-IV - stable.   # DISPOSITION:   # Mammogram nov 2025.  # follow up in 6 months- MD; PRIOR 2 week-  labs- cbc/cmp;MM panel; K/L light chains; iron studies;ferritin; LDH- vit D- 25-OH- venofer- - Dr.B

## 2024-06-25 NOTE — Progress Notes (Signed)
 [facility] CONSULT NOTE  Patient Care Team: Gauger, Lauraine Collar, NP as PCP - General (Internal Medicine) Shelva Dunnings, MD as Consulting Physician (General Surgery) Rennie Erica SAUNDERS, MD as Consulting Physician (Oncology)  CHIEF COMPLAINTS/PURPOSE OF CONSULTATION: Breast cancer/ MGUS.   #  Oncology History Overview Note   female with stage I left breast cancer s/p lumpectomy and sentinel lymph node biopsy on 09/17/2016.  Pathology revealed a 1.0 cm grade II invasive mammary carcinoma of no special type.  Margins were negative.  There was no tumor in 1 sentinel lymph node.  Pathologic stage was T1bN0M0.   Mammogram and ultrasound on 08/16/2016 revealed a 1.1 x 0.6 x 0.8 cm mass at the 10 o'clock position.  Left breast biopsy on 08/28/2016 revealed a 0.9 cm grade II invasive mammary carcinoma.  Tumor was ER positive (> 90%), PR negative, and Her2 2+ (negative by FISH).     She completed radiation on 11/28/2016.  She began Femara  on 12/11/2016; STOPPED FEMERA SPRING, 2023     Bilateral diagnostic mammogram on 09/04/2020 revealed no evidence of malignancy.   Right sided mammogram and ultrasound on 02/19/2018 revealed a 0.6 x 0.7 x 0.7 cm mass at the 12:30 position 3 cm from the nipple.  Ultrasound guided biopsy right breast biopsy and clip placement on 02/27/2018 revealed a hypocellular, circumscribed hyalanized nodule with no atypia or malignancy.   Bone density study on 09/25/2016 was normal with a T score of 0.4 in the left femoral neck.  Bone density was normal on 09/28/2018 with a T score of 0.7 in the AP spine L1-L2, 0.1 in the left femoral neck, and 0.9 in the right forearm radius.   She has a monoclonal gammopathy of unknown significance (MGUS).  SPEP on 12/11/2016 revealed a 0.4 gm/dL monoclonal protein.  She has mild renal insufficiency (Cr 1.28; CrCl 45 ml/min).  Work-up on 12/18/2016 revealed an immunofixation with an IgG monoclonal protein with kappa light  chain specificity.  IgG was 2081.  Free light chain ratio was 1.28 (normal).  Beta 2 microglobulin was 2.4 (normal).  24 hour urine revealed no monoclonal protein or free light chains.  Bone survey on 01/08/2017 revealed no lytic lesions.   M-spike has been followed (gm/dL):  0.4 on 97/92/7981, 0.6 on 07/16/2017, 0.4 on 01/14/2018, 0.6 on 08/12/2018, 0.5 on 12/16/2018, 0.5 on 06/16/2019, 0.5 on 12/21/2019, and 0.7 on 06/20/2020.  IgG was 2041 on 12/18/2016, 1926 on 07/16/2017, and 1919 on 12/16/2018.     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  Accompanied by her daughter.  Erica Jackson 89 y.o.  female with breast cancer ER/PR positive HER2 negative stage I  s/p adjuvant letrozole  [finished letrozole  spring 2023] currently on surveillance and MGUS is here for follow-up/and review labs/mammogram.   Appetite is good.   No nausea vomiting.  Denies any hot flashes.  Appetite is good.  No significant weight loss.  Patient states to be compliant with her oral iron.  No constipation.  No diarrhea.  Review of Systems  Constitutional:  Negative for chills, diaphoresis, fever, malaise/fatigue and weight loss.  HENT:  Negative for nosebleeds and sore throat.   Eyes:  Negative for double vision.  Respiratory:  Negative for cough, hemoptysis, sputum production, shortness of breath and wheezing.  Cardiovascular:  Negative for chest pain, palpitations, orthopnea and leg swelling.  Gastrointestinal:  Negative for abdominal pain, blood in stool, constipation, diarrhea, heartburn, melena, nausea and vomiting.  Genitourinary:  Negative for dysuria, frequency and urgency.  Musculoskeletal:  Negative for back pain and joint pain.  Skin: Negative.  Negative  for itching and rash.  Neurological:  Negative for dizziness, tingling, focal weakness, weakness and headaches.  Endo/Heme/Allergies:  Does not bruise/bleed easily.  Psychiatric/Behavioral:  Negative for depression. The patient is not nervous/anxious and does not have insomnia.      MEDICAL HISTORY:  Past Medical History:  Diagnosis Date   Breast cancer (HCC) 2017   Lower inner quadrant left breast with lumpectomy and rad tx   Breast cancer, left breast (HCC) 08/04/2016   Overview:  0.9 cm grade II invasive mammary carcinoma. Tumor was ER positive (> 90%), PR negative, and Her2 2+ (FISH negative)   Cataract    Dysrhythmia    no problems in a long time   GERD (gastroesophageal reflux disease)    Headache    Hypercholesterolemia    Hypertension    Malignant neoplasm of upper-inner quadrant of left breast in female, estrogen receptor positive (HCC)    Personal history of radiation therapy 11/2016   for left breast ca    SURGICAL HISTORY: Past Surgical History:  Procedure Laterality Date   ABDOMINAL HYSTERECTOMY     BREAST BIOPSY Left 08/28/2016   invasive mammary ca   BREAST BIOPSY Right 08/2017   HYALINIZED STROMA   BREAST BIOPSY Right 03/2018   HYPOCELLULAR HYALINIZED NODULE   BREAST EXCISIONAL BIOPSY Left 09/17/2016   lt breast ca   BREAST LUMPECTOMY Left 09/2016   invasive mammary carcinoma with clear margins and rad tx   BREAST LUMPECTOMY WITH SENTINEL LYMPH NODE BIOPSY Left 09/17/2016   Procedure: BREAST LUMPECTOMY WITH SENTINEL LYMPH NODE BX;  Surgeon: Carlin Pastel, MD;  Location: [facility] ORS;  Service: General;  Laterality: Left;   EYE SURGERY Left 2015   OOPHORECTOMY      SOCIAL HISTORY: Social History   Socioeconomic History   Marital status: Married    Spouse name: Not on file   Number of children: Not on file   Years of education: Not on file   Highest education level: Not on file  Occupational History   Not on file  Tobacco Use   Smoking status:  Former    Current packs/day: 0.00    Types: Cigarettes    Quit date: 09/09/1989    Years since quitting: 34.8   Smokeless tobacco: Former    Quit date: 09/09/1989  Vaping Use   Vaping status: Not on file  Substance and Sexual Activity   Alcohol use: No   Drug use: No   Sexual activity: Not on file  Other Topics Concern   Not on file  Social History Narrative   Not on file   Social Drivers of Health   Financial Resource Strain: Low Risk  (07/11/2023)   Received from [facility]   Overall Financial Resource Strain (CARDIA)    Difficulty of Paying Living Expenses: Not hard at all  Food Insecurity: No Food Insecurity (07/11/2023)   Received from [facility]   Hunger Vital Sign    Within the past 12 months, you worried that your food would run out before you got the money to buy more.: Never true    Within the past 12 months, the food you bought just didn't  last and you didn't have money to get more.: Never true  Transportation Needs: No Transportation Needs (07/11/2023)   Received from [facility]   PRAPARE - Transportation    Lack of Transportation (Non-Medical): No    In the past 12 months, has lack of transportation kept you from medical appointments or from getting medications?: No  Physical Activity: Not on file  Stress: Not on file  Social Connections: Not on file  Intimate Partner Violence: Not on file    FAMILY HISTORY: Family History  Problem Relation Age of Onset   Stroke Mother    Cancer Sister    Cancer Brother    Cancer Daughter    Cancer Brother    Cancer Brother    Cancer Brother    Cancer Brother    Cancer Brother    Breast cancer Neg Hx     ALLERGIES:  has no known allergies.  MEDICATIONS:  Current Outpatient Medications  Medication Sig Dispense Refill   acetaminophen  (TYLENOL ) 650 MG CR tablet Take 1 tablet by mouth as needed.     aspirin EC 81 MG tablet Take 81 mg by mouth daily.      atenolol  (TENORMIN) 25 MG tablet TAKE ONE TABLET BY MOUTH ONCE DAILY     Cyanocobalamin (B-12) 1000 MCG TABS Take by mouth daily.     felodipine (PLENDIL) 5 MG 24 hr tablet Take 2.5 mg by mouth daily.     isosorbide dinitrate (ISORDIL) 20 MG tablet Take 10 mg by mouth daily.     lovastatin (MEVACOR) 10 MG tablet TAKE ONE TABLET BY MOUTH ONCE DAILY WITH DINNER     olmesartan (BENICAR) 20 MG tablet Take 20 mg by mouth daily.     omeprazole (PRILOSEC) 20 MG capsule Take 20 mg by mouth daily.      potassium chloride SA (K-DUR,KLOR-CON) 20 MEQ tablet Take 20 mEq by mouth daily.      No current facility-administered medications for this visit.    PHYSICAL EXAMINATION: ECOG PERFORMANCE STATUS: 1 - Symptomatic but completely ambulatory  Vitals:   06/25/24 1446  BP: (!) 170/80  Pulse: 81  Resp: 16  Temp: 97.7 F (36.5 C)  SpO2: 99%    Filed Weights   06/25/24 1446  Weight: 158 lb (71.7 kg)   Physical Exam Vitals and nursing note reviewed.  HENT:     Head: Normocephalic and atraumatic.     Mouth/Throat:     Pharynx: Oropharynx is clear.  Eyes:     Extraocular Movements: Extraocular movements intact.     Pupils: Pupils are equal, round, and reactive to light.  Cardiovascular:     Rate and Rhythm: Normal rate and regular rhythm.  Pulmonary:     Comments: Decreased breath sounds bilaterally.  Abdominal:     Palpations: Abdomen is soft.  Musculoskeletal:        General: Normal range of motion.     Cervical back: Normal range of motion.  Skin:    General: Skin is warm.  Neurological:     General: No focal deficit present.     Mental Status: She is alert and oriented to person, place, and time.  Psychiatric:        Behavior: Behavior normal.        Judgment: Judgment normal.     LABORATORY DATA:  I have reviewed the data as listed Lab Results  Component Value Date   WBC 6.3 06/11/2024   HGB 10.0 (L) 06/11/2024  HCT 31.3 (L) 06/11/2024   MCV 98.1 06/11/2024   PLT 221  06/11/2024   Recent Labs    12/11/23 1201 06/11/24 1506  NA 139 138  K 4.1 3.7  CL 110 108  CO2 22 23  GLUCOSE 106* 97  BUN 26* 17  CREATININE 1.76* 1.47*  CALCIUM 9.0 9.0  GFRNONAA 28* 34*  PROT 7.3 7.5  ALBUMIN 3.4* 3.4*  AST 13* 16  ALT 12 14  ALKPHOS 84 95  BILITOT 0.4 0.2    RADIOGRAPHIC STUDIES: I have personally reviewed the radiological images as listed and agreed with the findings in the report. No results found.  ASSESSMENT & PLAN:   Symptomatic anemia # Anemia: Mild anemia-chronic hemoglobin around 10.0  sec to stage III kidney disease overall stable Continue gentle iron [iron biglycinate-consider Venofer next week.  #  monoclonal gammopathy of unknown significance (MGUS; sister died of MM] - Again reviewed the extremely small risk of progression to active multiple myeloma. K/Lratio=2.0.  However continue monitor every 6 months.    # Stage I ER/PR positive HER2 negative breast cancer-s/p lumpectomy followed by radiation- leobardo spring 2018]. Finished AI  spring 2023. Continue surveillance off any therapy at this time.  Mammogram  November 2024- WNL.  Ordered mammogram  #Bone density- [AUG 2023]-  BMD T-score of -0.3. continue calcium plus vitamin D - stable; will reeapt in 2026   # CKD stage III-IV - stable.   # DISPOSITION:   # Mammogram nov 2025.  # follow up in 6 months- MD; PRIOR 2 week-  labs- cbc/cmp;MM panel; K/L light chains; iron studies;ferritin; LDH- vit D- 25-OH- venofer- - Dr.B    All questions were answered. The patient knows to call the clinic with any problems, questions or concerns.    Erica JONELLE Joe, MD 06/25/2024 3:24 PM

## 2024-07-02 DIAGNOSIS — H04123 Dry eye syndrome of bilateral lacrimal glands: Secondary | ICD-10-CM | POA: Diagnosis not present

## 2024-07-02 DIAGNOSIS — Z961 Presence of intraocular lens: Secondary | ICD-10-CM | POA: Diagnosis not present

## 2024-07-02 DIAGNOSIS — H40003 Preglaucoma, unspecified, bilateral: Secondary | ICD-10-CM | POA: Diagnosis not present

## 2024-07-02 DIAGNOSIS — H26491 Other secondary cataract, right eye: Secondary | ICD-10-CM | POA: Diagnosis not present

## 2024-07-02 DIAGNOSIS — Z0100 Encounter for examination of eyes and vision without abnormal findings: Secondary | ICD-10-CM | POA: Diagnosis not present

## 2024-07-02 DIAGNOSIS — H2512 Age-related nuclear cataract, left eye: Secondary | ICD-10-CM | POA: Diagnosis not present

## 2024-09-14 ENCOUNTER — Ambulatory Visit
Admission: RE | Admit: 2024-09-14 | Discharge: 2024-09-14 | Disposition: A | Discharge status: Home / Self Care | Source: Ambulatory Visit | Attending: Internal Medicine | Admitting: Internal Medicine

## 2024-09-14 DIAGNOSIS — Z1231 Encounter for screening mammogram for malignant neoplasm of breast: Secondary | ICD-10-CM | POA: Diagnosis not present

## 2024-09-14 DIAGNOSIS — D649 Anemia, unspecified: Secondary | ICD-10-CM | POA: Diagnosis present

## 2024-09-14 DIAGNOSIS — Z853 Personal history of malignant neoplasm of breast: Secondary | ICD-10-CM | POA: Diagnosis not present

## 2024-12-07 ENCOUNTER — Inpatient Hospital Stay

## 2024-12-21 ENCOUNTER — Ambulatory Visit: Admitting: Internal Medicine

## 2024-12-21 ENCOUNTER — Ambulatory Visit
